# Patient Record
Sex: Male | Born: 1965 | Race: White | Hispanic: No | Marital: Married | Smoking: Former smoker
Health system: Southern US, Community
[De-identification: ages and names within clinical notes are randomized; demographics above are authoritative.]

## PROBLEM LIST (undated history)

## (undated) DIAGNOSIS — I10 Essential (primary) hypertension: Secondary | ICD-10-CM

## (undated) DIAGNOSIS — T7840XA Allergy, unspecified, initial encounter: Secondary | ICD-10-CM

## (undated) DIAGNOSIS — R519 Headache, unspecified: Secondary | ICD-10-CM

## (undated) DIAGNOSIS — E039 Hypothyroidism, unspecified: Secondary | ICD-10-CM

## (undated) DIAGNOSIS — J302 Other seasonal allergic rhinitis: Secondary | ICD-10-CM

## (undated) DIAGNOSIS — E785 Hyperlipidemia, unspecified: Secondary | ICD-10-CM

## (undated) HISTORY — DX: Allergy, unspecified, initial encounter: T78.40XA

## (undated) HISTORY — PX: WISDOM TOOTH EXTRACTION: SHX21

## (undated) HISTORY — PX: BACK SURGERY: SHX140

---

## 2000-11-10 ENCOUNTER — Observation Stay (HOSPITAL_COMMUNITY): Admission: EM | Admit: 2000-11-10 | Discharge: 2000-11-11 | Payer: Self-pay | Admitting: Emergency Medicine

## 2004-11-01 ENCOUNTER — Encounter: Admission: RE | Admit: 2004-11-01 | Discharge: 2004-11-01 | Payer: Self-pay | Admitting: Emergency Medicine

## 2004-11-01 IMAGING — CR DG CHEST 2V
2 series · 2 of 2 positions shown · non-contrast
Comparison: None available.

CLINICAL DATA: Positive PPD, smoker. 
 CHEST - 2 VIEW:

[w chest pa]
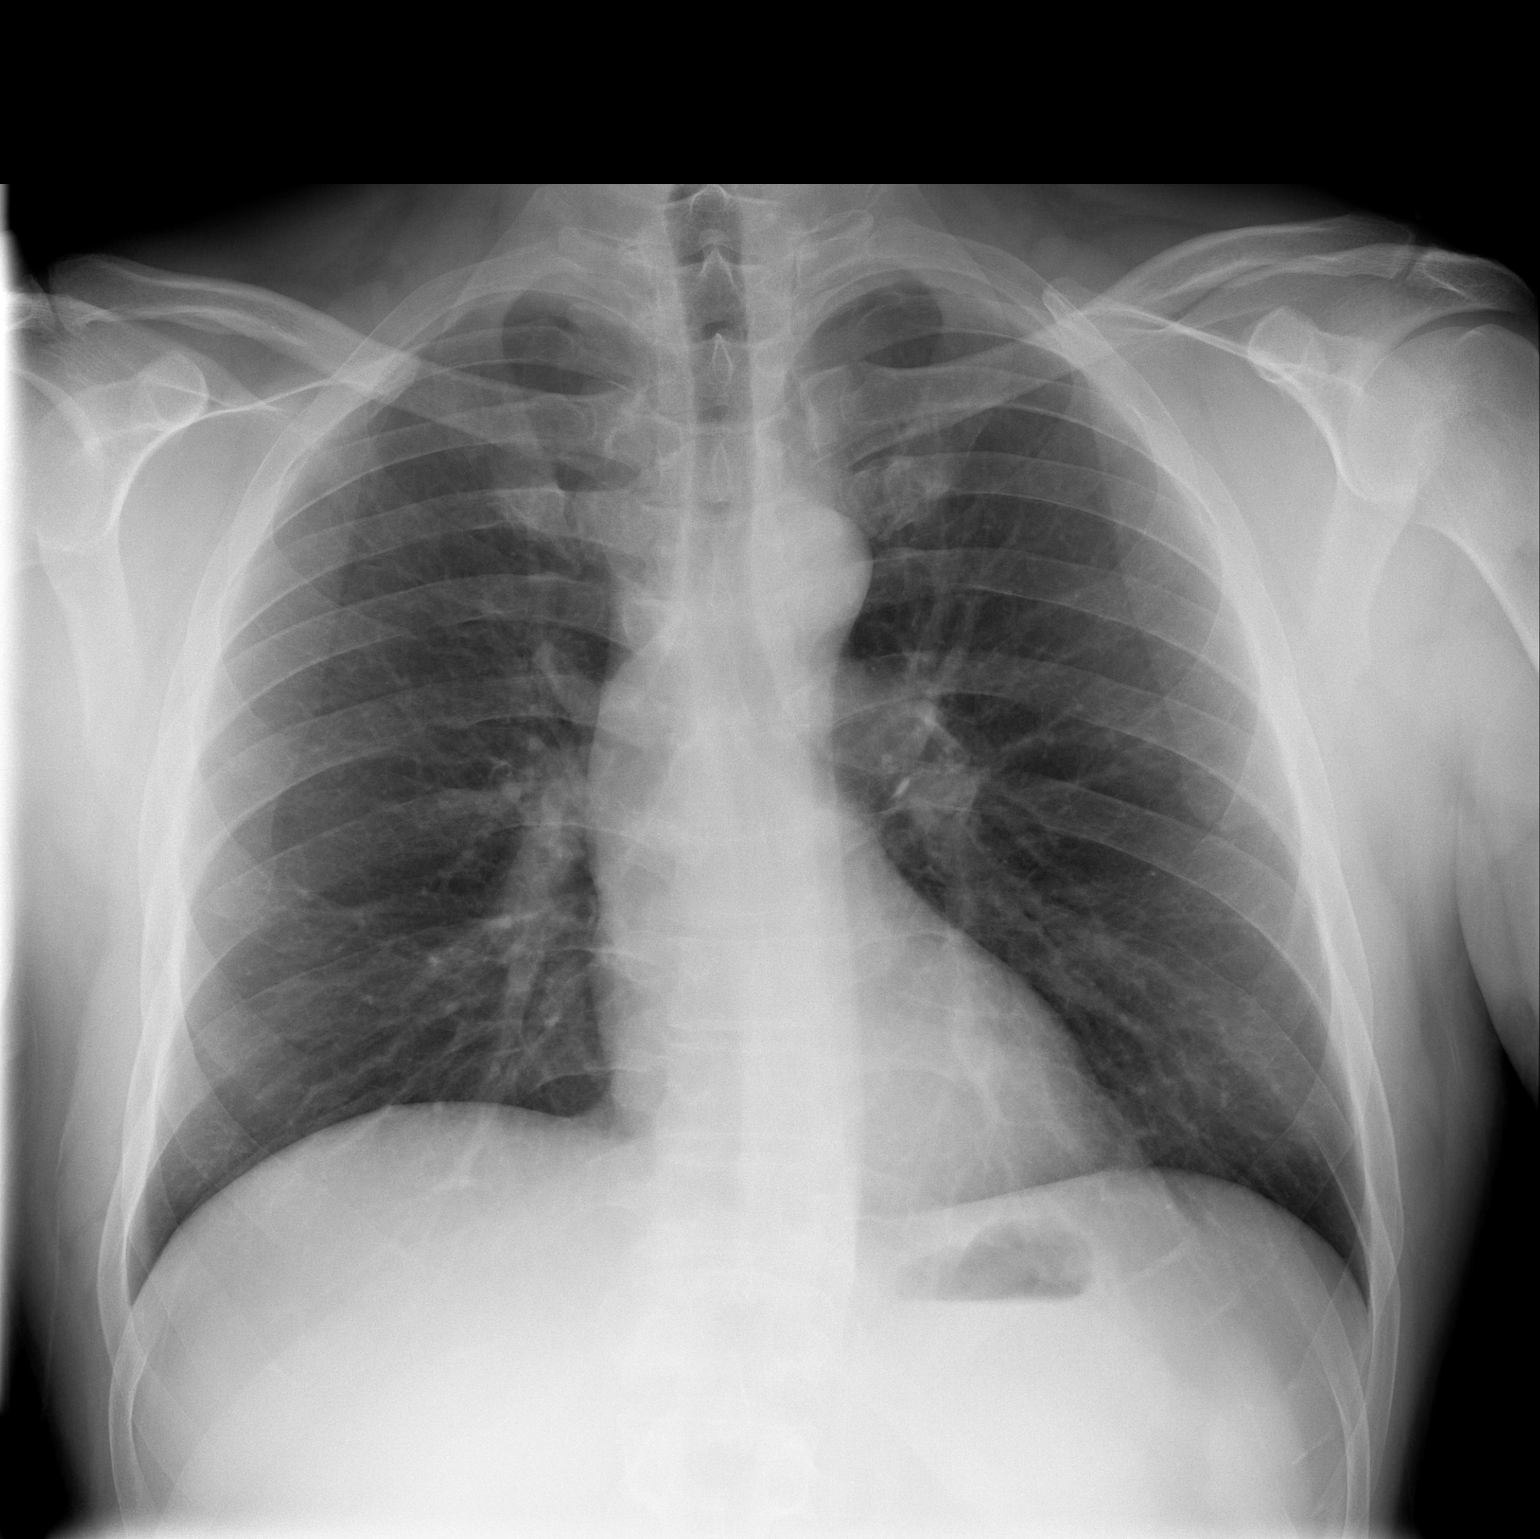

[w chest lat]
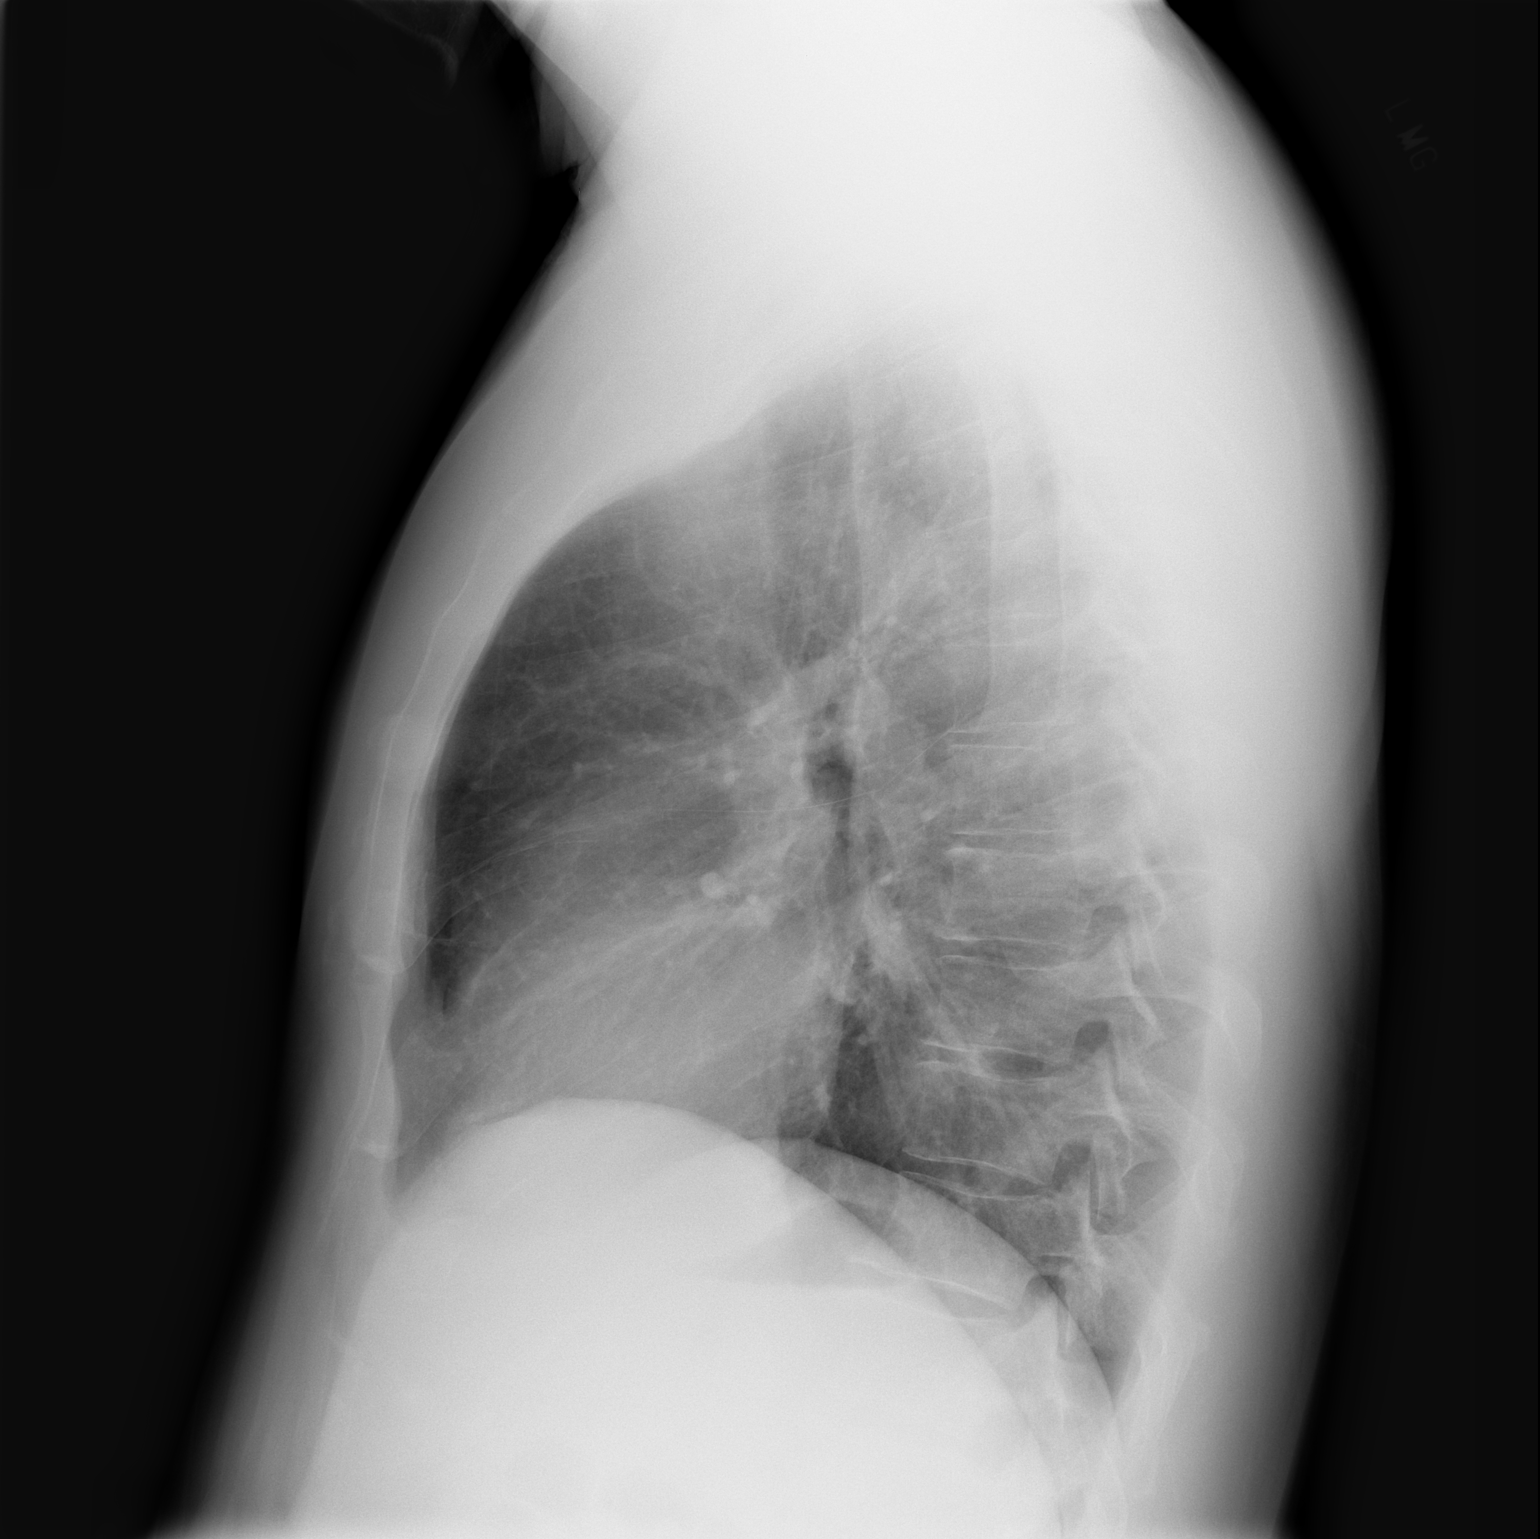

[2 of 2 positions shown; findings below may reference images not displayed]

FINDINGS: The trachea is midline.  Heart size is within normal limits.  There is mild prominence of ascending aorta.  Mild biapical pleural thickening. The lungs are clear.  No pleural fluid.
IMPRESSION: No evidence of active tuberculosis.

## 2005-09-24 ENCOUNTER — Encounter: Admission: RE | Admit: 2005-09-24 | Discharge: 2005-09-24 | Payer: Self-pay | Admitting: Family Medicine

## 2006-01-25 ENCOUNTER — Encounter: Admission: RE | Admit: 2006-01-25 | Discharge: 2006-01-25 | Payer: Self-pay | Admitting: Emergency Medicine

## 2006-01-25 IMAGING — CR DG CHEST 2V
2 series · 2 of 2 positions shown · non-contrast
Comparison: [DATE].

CLINICAL DATA: Positive PPD.  Cough.
 CHEST - TWO VIEWS:

[view not recorded (1 of 2)]
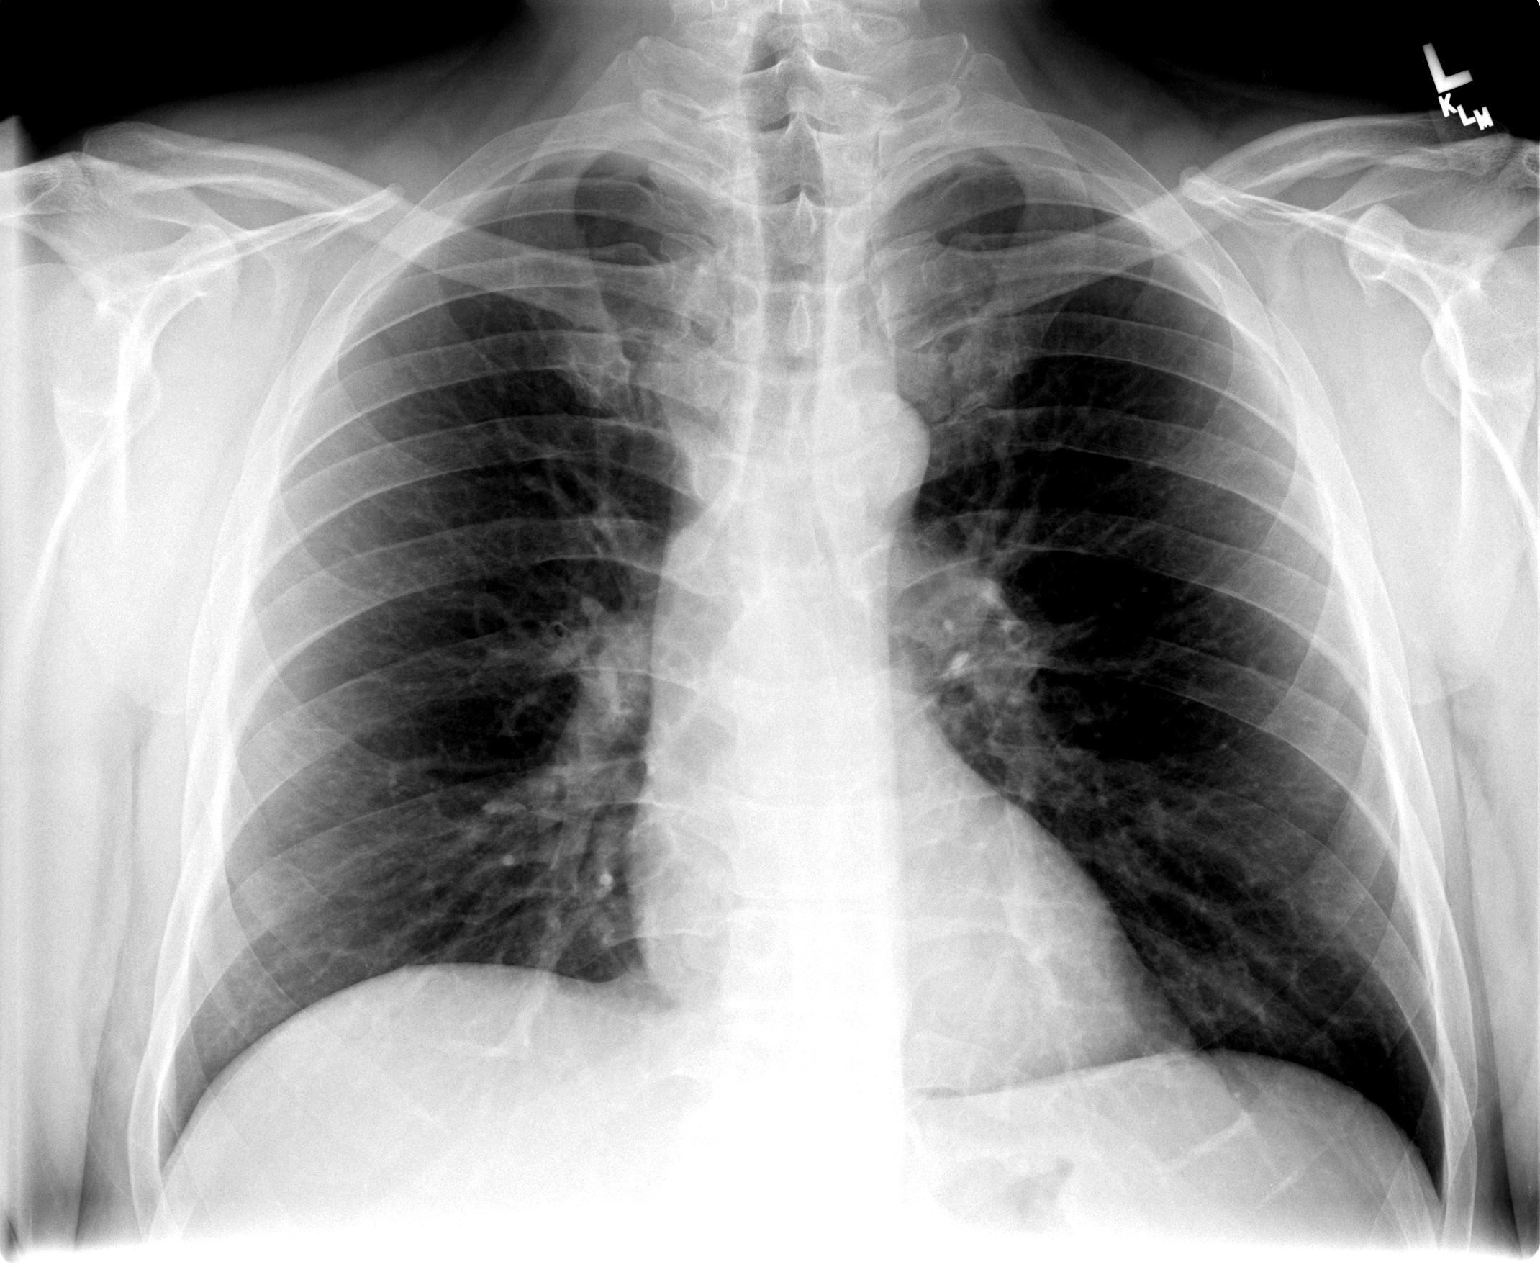

[view not recorded (2 of 2)]
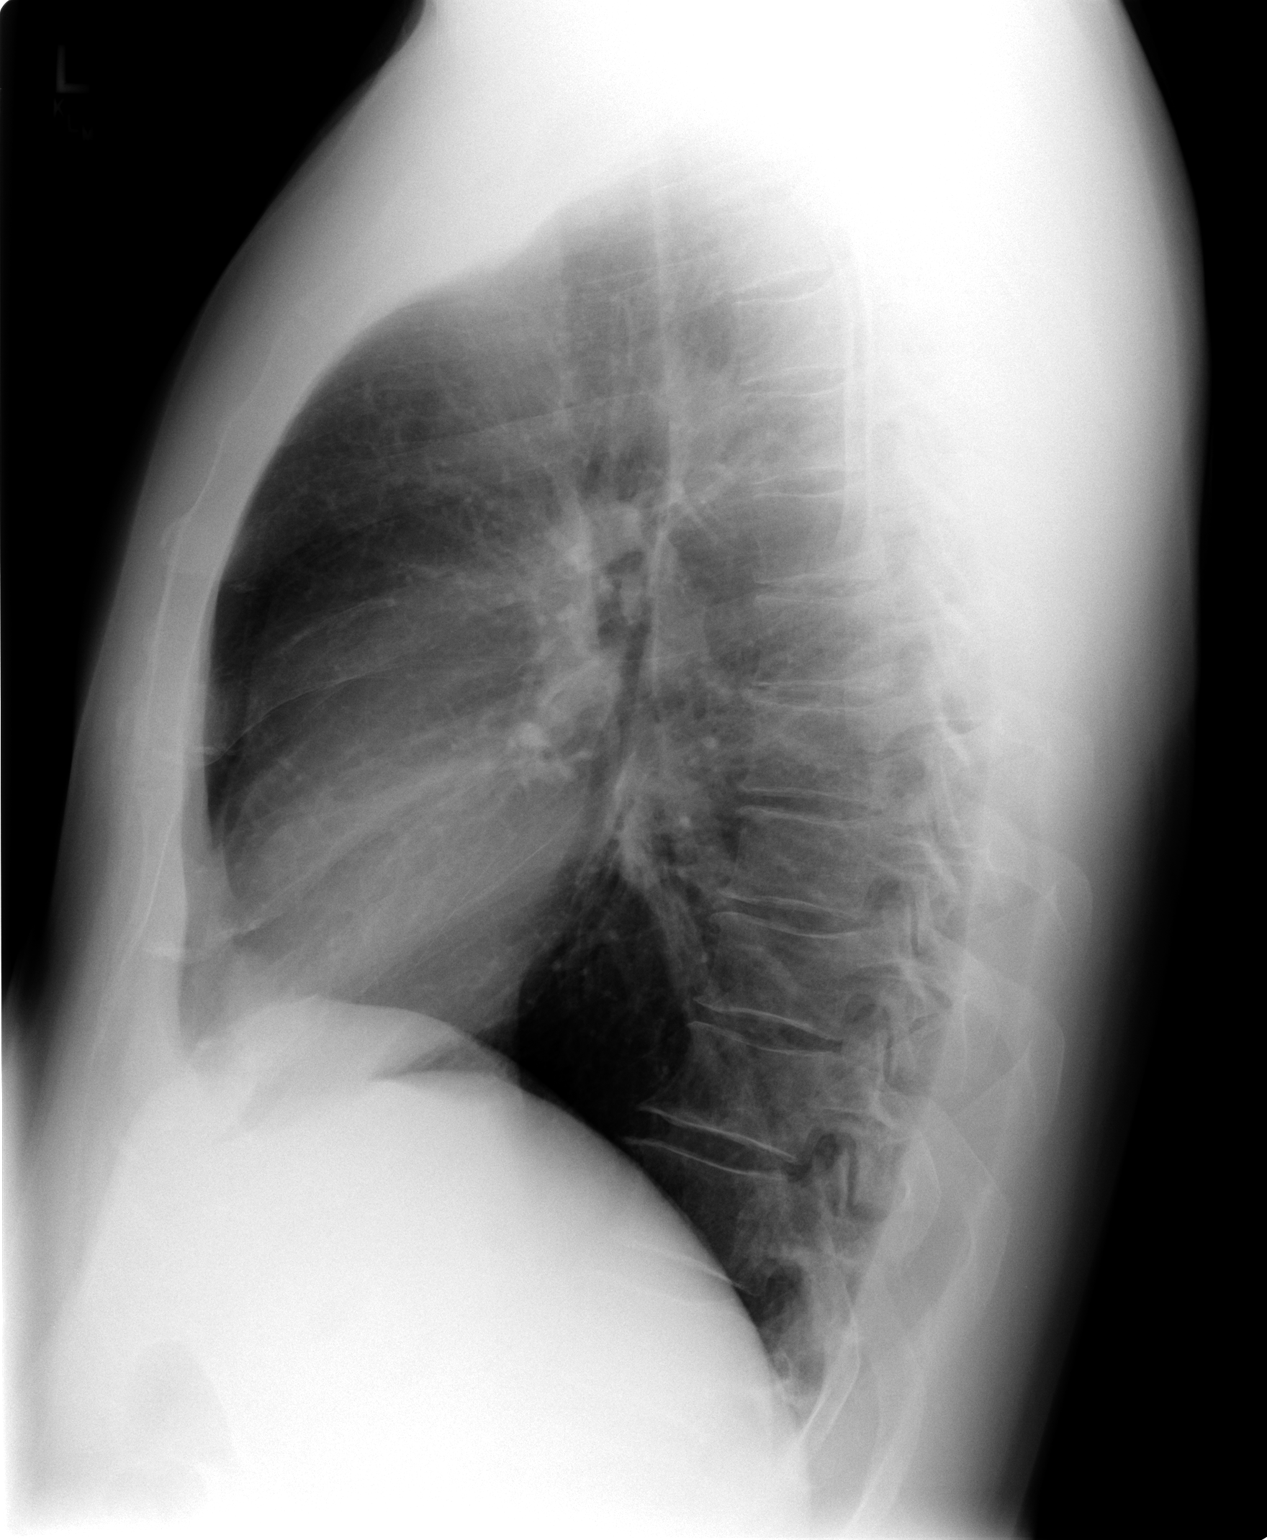

[2 of 2 positions shown; findings below may reference images not displayed]

The heart size and mediastinal contours are within normal limits.  Both lungs are clear.  The visualized skeletal structures are unremarkable.
IMPRESSION: No active cardiopulmonary disease.

## 2006-04-26 IMAGING — CR DG CHEST 2V
2 series · 2 of 2 positions shown · non-contrast
Comparison: [DATE].

CLINICAL DATA: Positive PPD one year ago, asymptomatic. 
 PA AND LATERAL CHEST:

[view not recorded (1 of 2)]
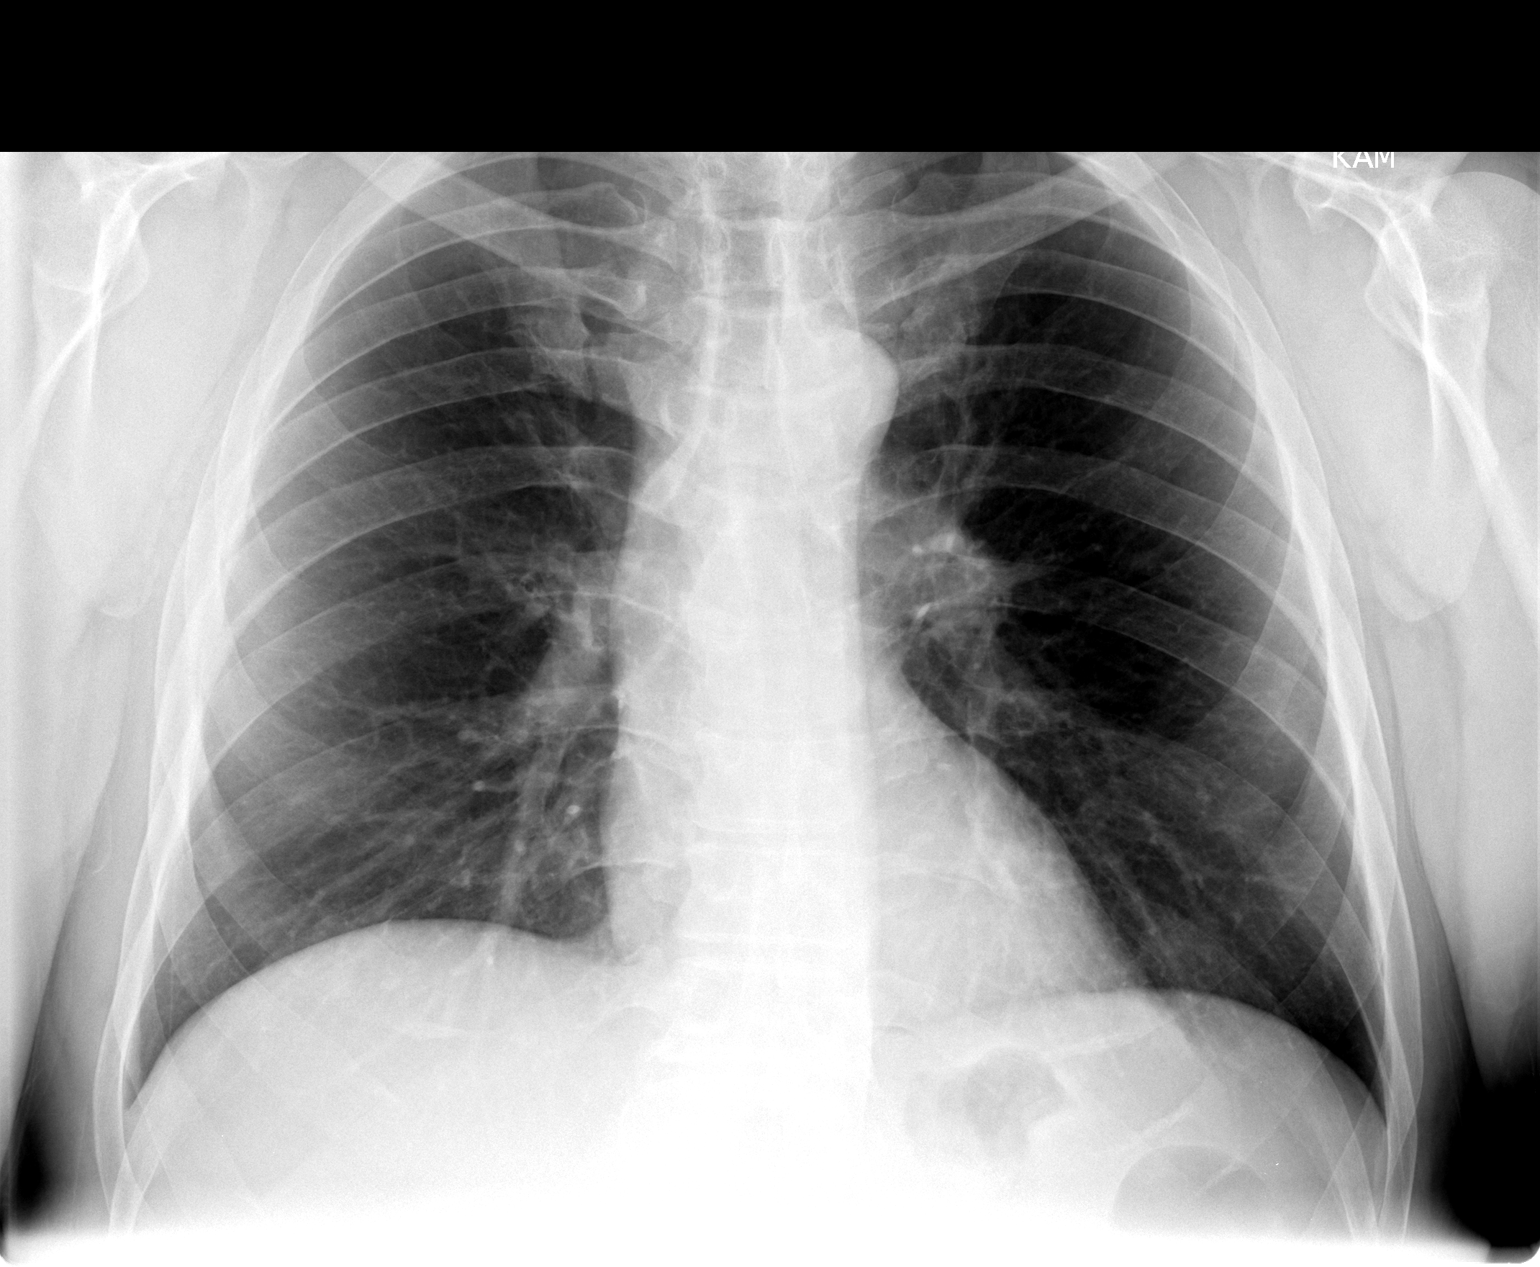

[view not recorded (2 of 2)]
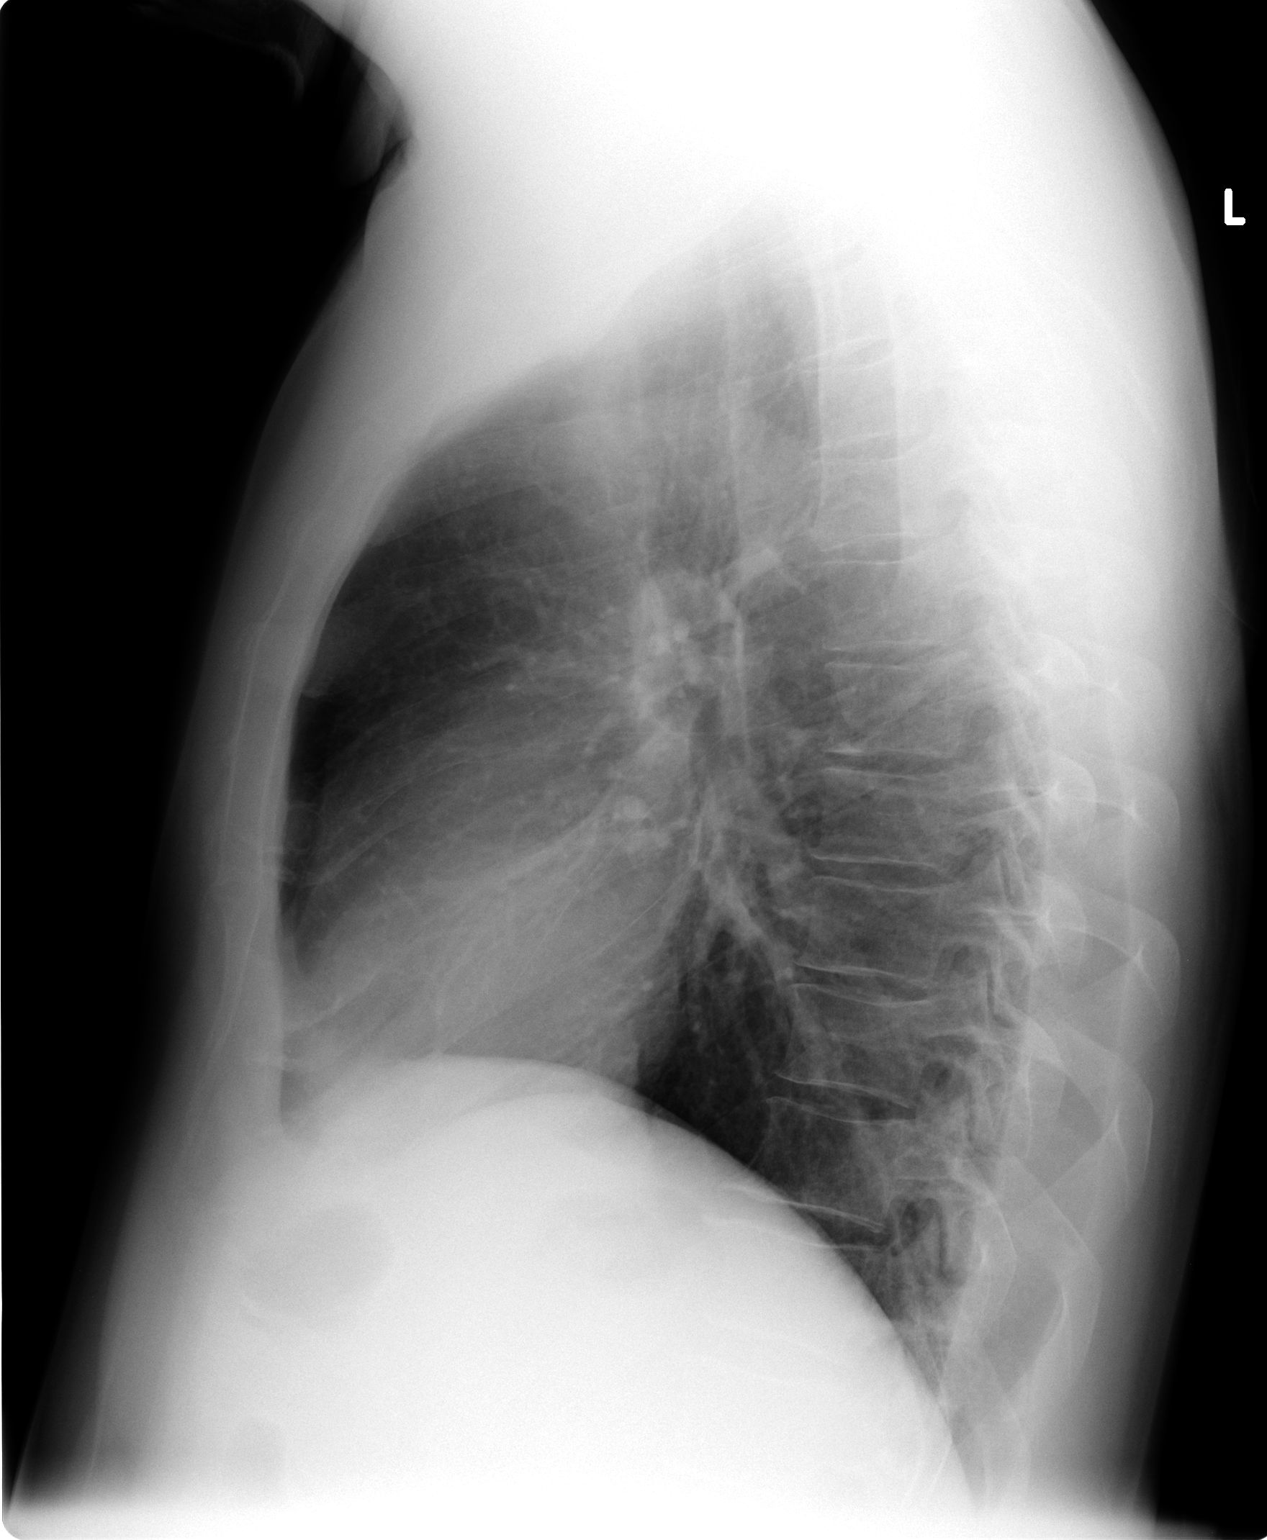

[2 of 2 positions shown; findings below may reference images not displayed]

The heart size and mediastinal contours are unremarkable.  The lungs are clear.  The visualized skeleton is unremarkable.
IMPRESSION: No active disease.

## 2008-03-26 ENCOUNTER — Encounter: Admission: RE | Admit: 2008-03-26 | Discharge: 2008-03-26 | Payer: Self-pay | Admitting: Emergency Medicine

## 2008-03-26 IMAGING — CT CT HEAD W/O CM
2 series · 16 of 30 positions shown, 20 images · non-contrast
Comparison: None

CLINICAL DATA: Worsening headache.  Blurred vision.

CT HEAD WITHOUT CONTRAST
TECHNIQUE: Contiguous axial images were obtained from the base of
the skull through the vertex without contrast.

[Series 2: brain · axial · 0.44mm/px · z∈[-409,-284]mm · 13 of 30 slices shown, 17 images]
[im 3/30  brain]
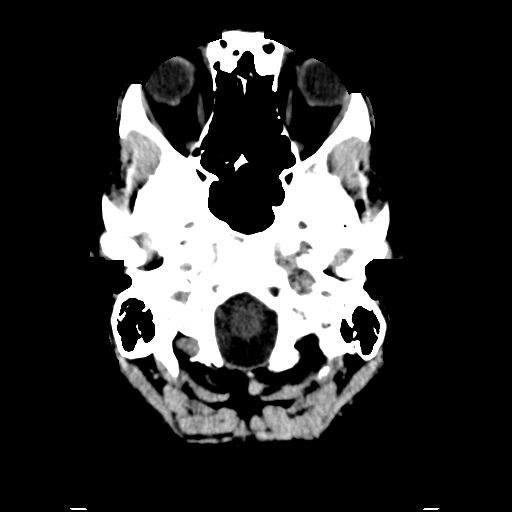
[im 3/30  bone]
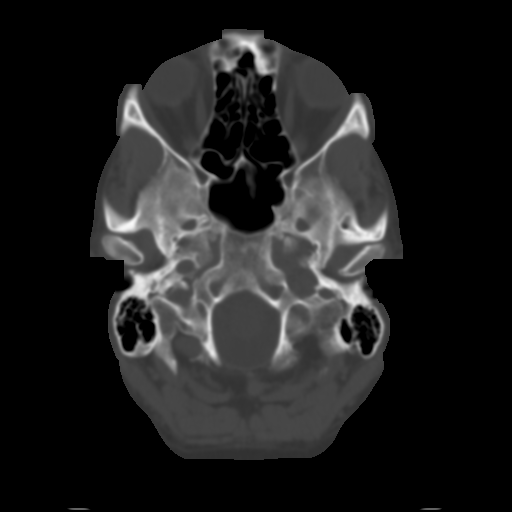
[im 5/30  brain]
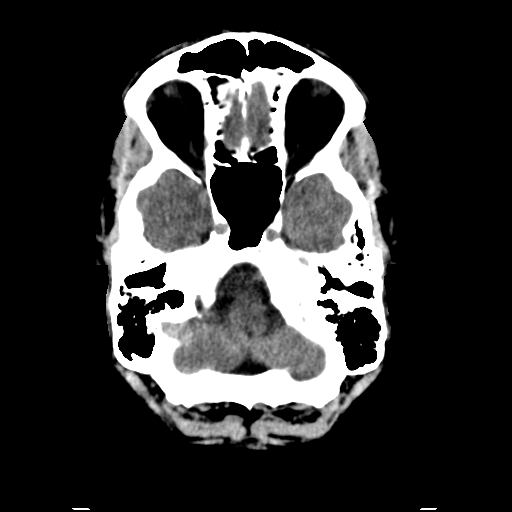
[im 7/30  brain]
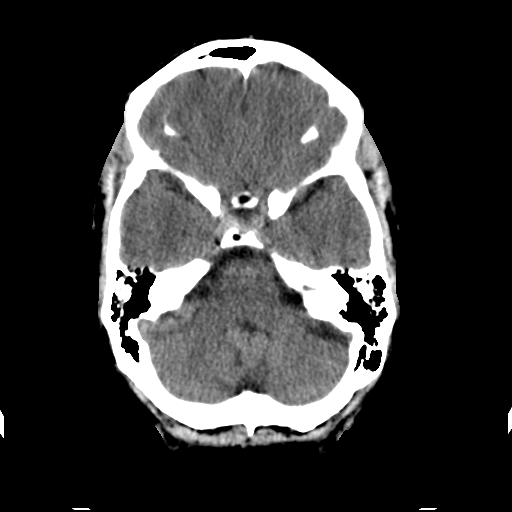
[im 9/30  brain]
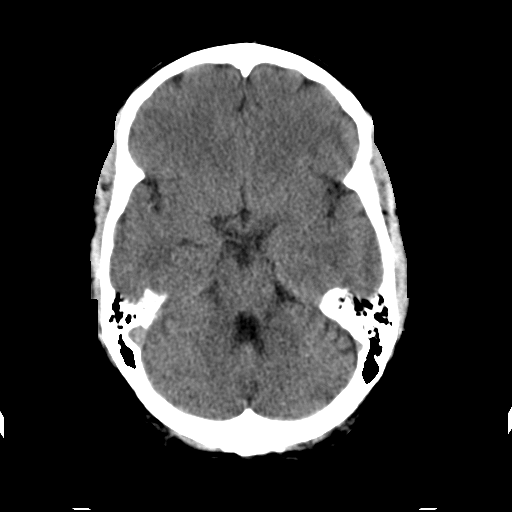
[im 11/30  brain]
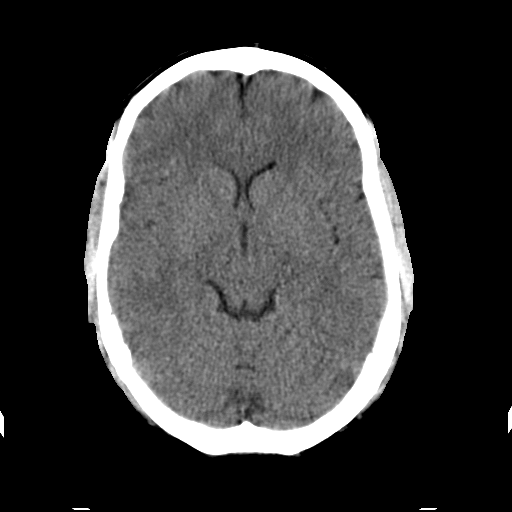
[im 11/30  bone]
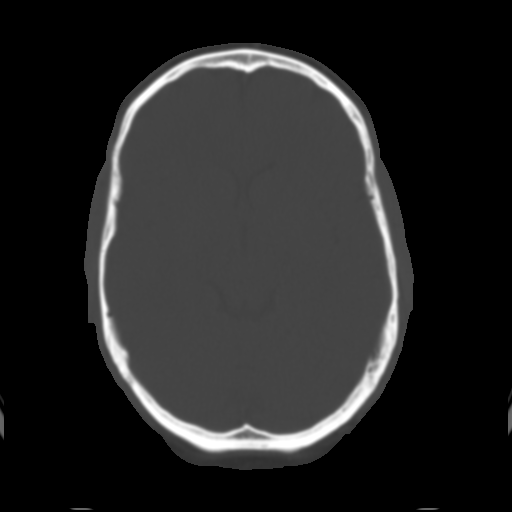
[im 13/30  brain]
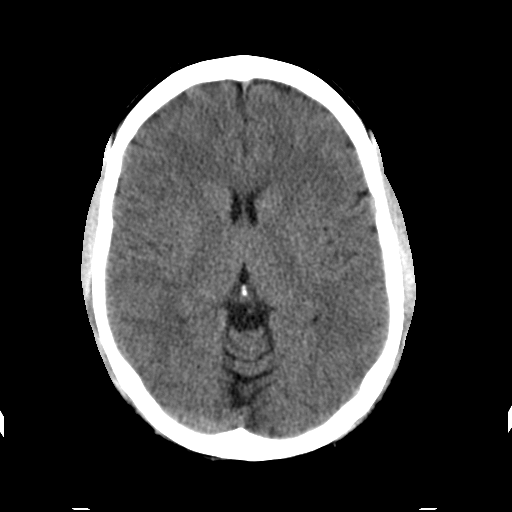
[im 15/30  brain]
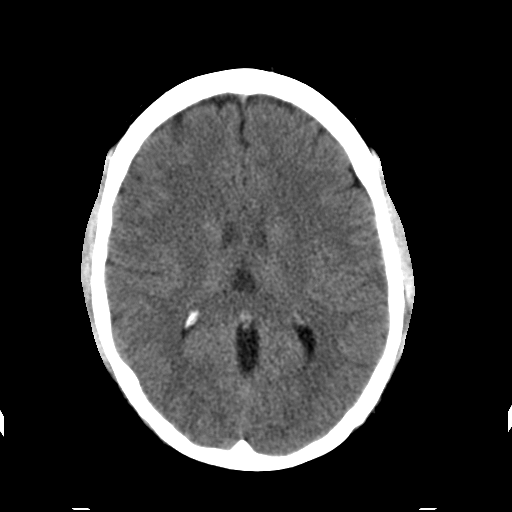
[im 17/30  brain]
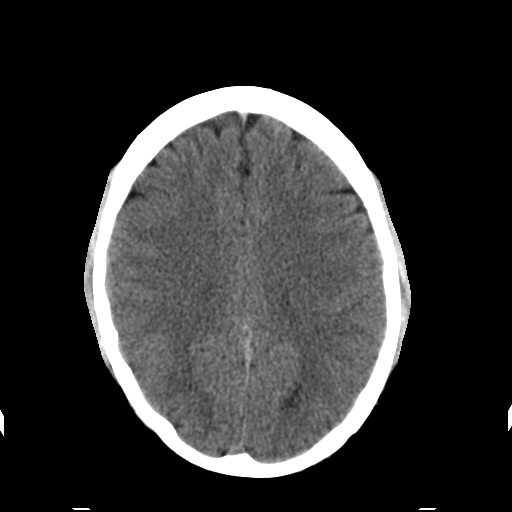
[im 19/30  brain]
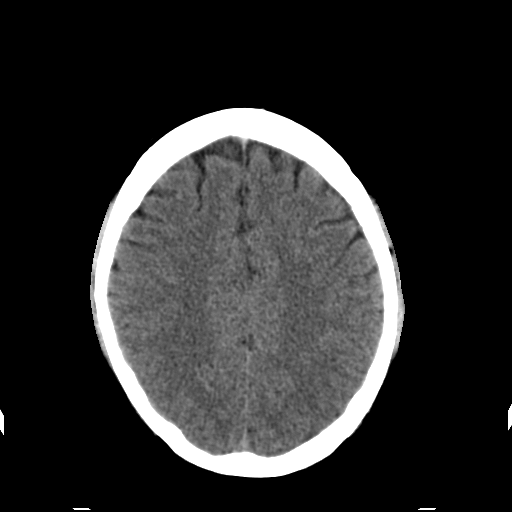
[im 19/30  bone]
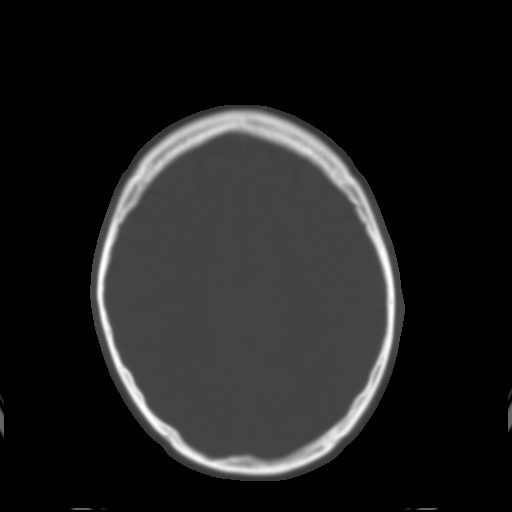
[im 21/30  brain]
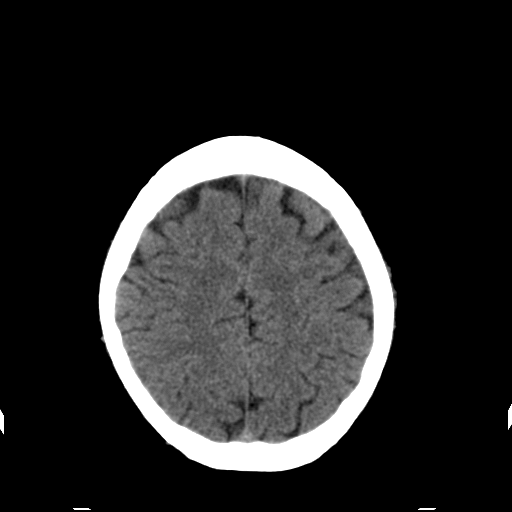
[im 23/30  brain]
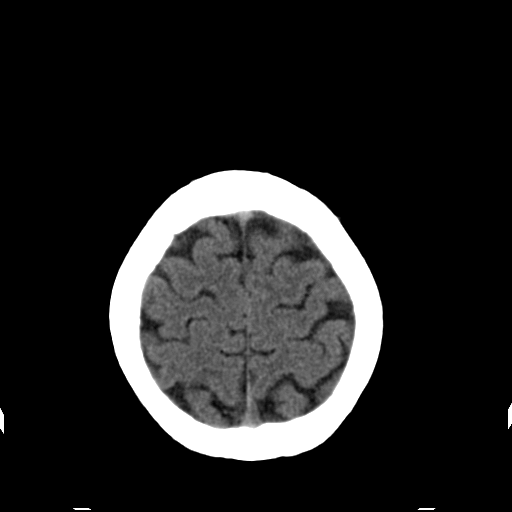
[im 25/30  brain]
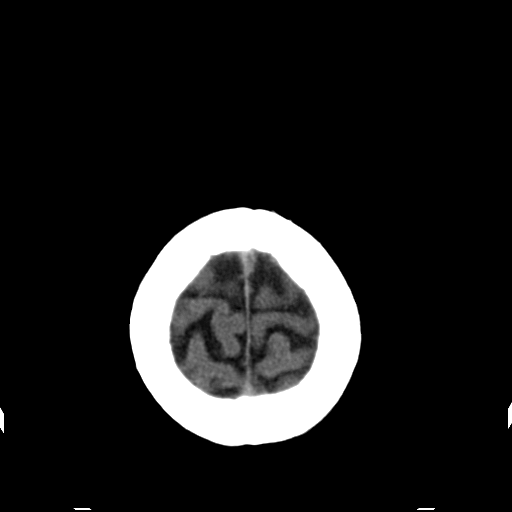
[im 27/30  brain]
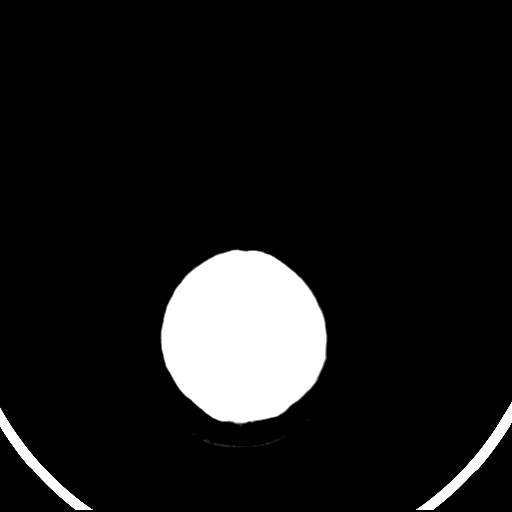
[im 27/30  bone]
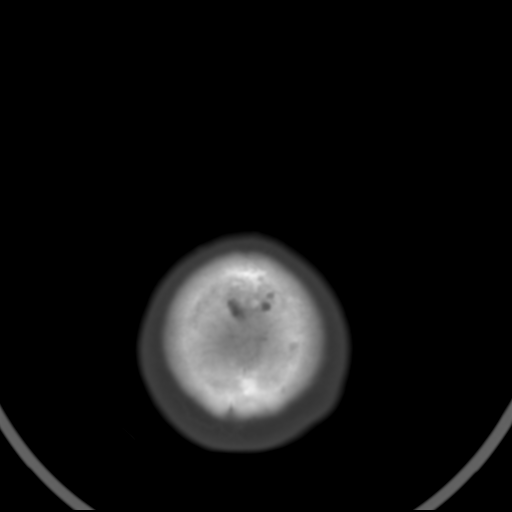

[Series 3: bone windows · axial · 0.44mm/px · z∈[-409,-367]mm · 3 of 30 slices shown]
[im 3/30  bone]
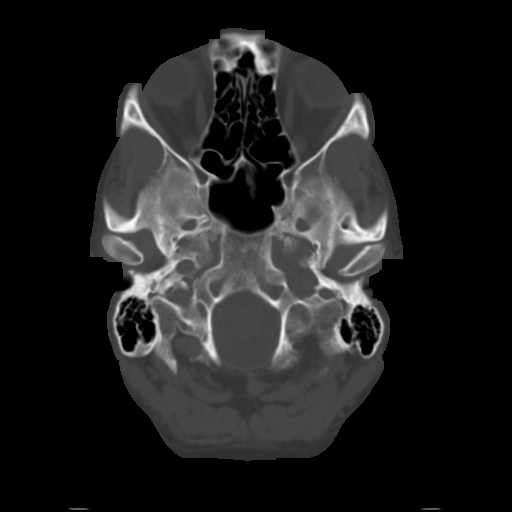
[im 7/30  bone]
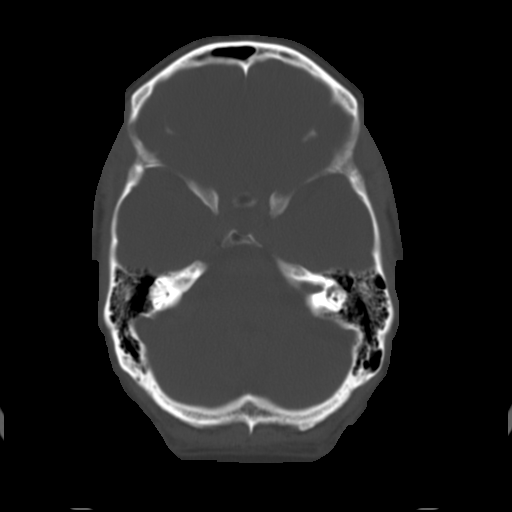
[im 11/30  bone]
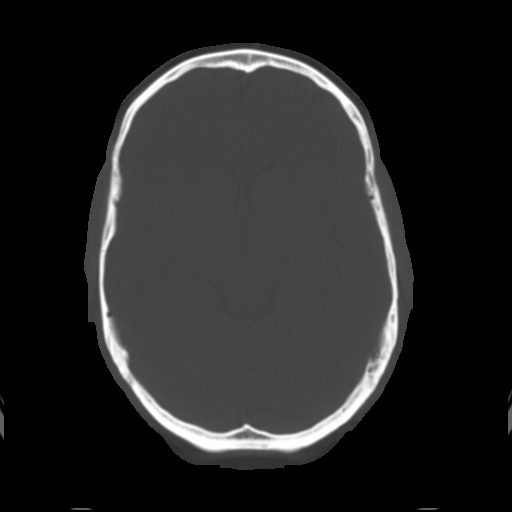

[16 of 30 positions shown; findings below may reference images not displayed]

FINDINGS: The brain has a normal appearance without evidence of
atrophy, infarction, mass lesion, hemorrhage, hydrocephalus or
extra-axial collection.  Visualized paranasal sinuses are clear.
Middle ears and mastoids are clear.  No calvarial lesion.
IMPRESSION: Normal examination

## 2009-06-15 ENCOUNTER — Ambulatory Visit (HOSPITAL_COMMUNITY)
Admission: RE | Admit: 2009-06-15 | Discharge: 2009-06-15 | Payer: Self-pay | Admitting: Physical Medicine and Rehabilitation

## 2009-06-15 IMAGING — CR DG ORBITS FOR FOREIGN BODY
2 series · 2 of 2 positions shown · non-contrast
Comparison: [DATE]

CLINICAL DATA: pre MRI screening

ORBITS FOR FOREIGN BODY - 2 VIEW

[w waters *]
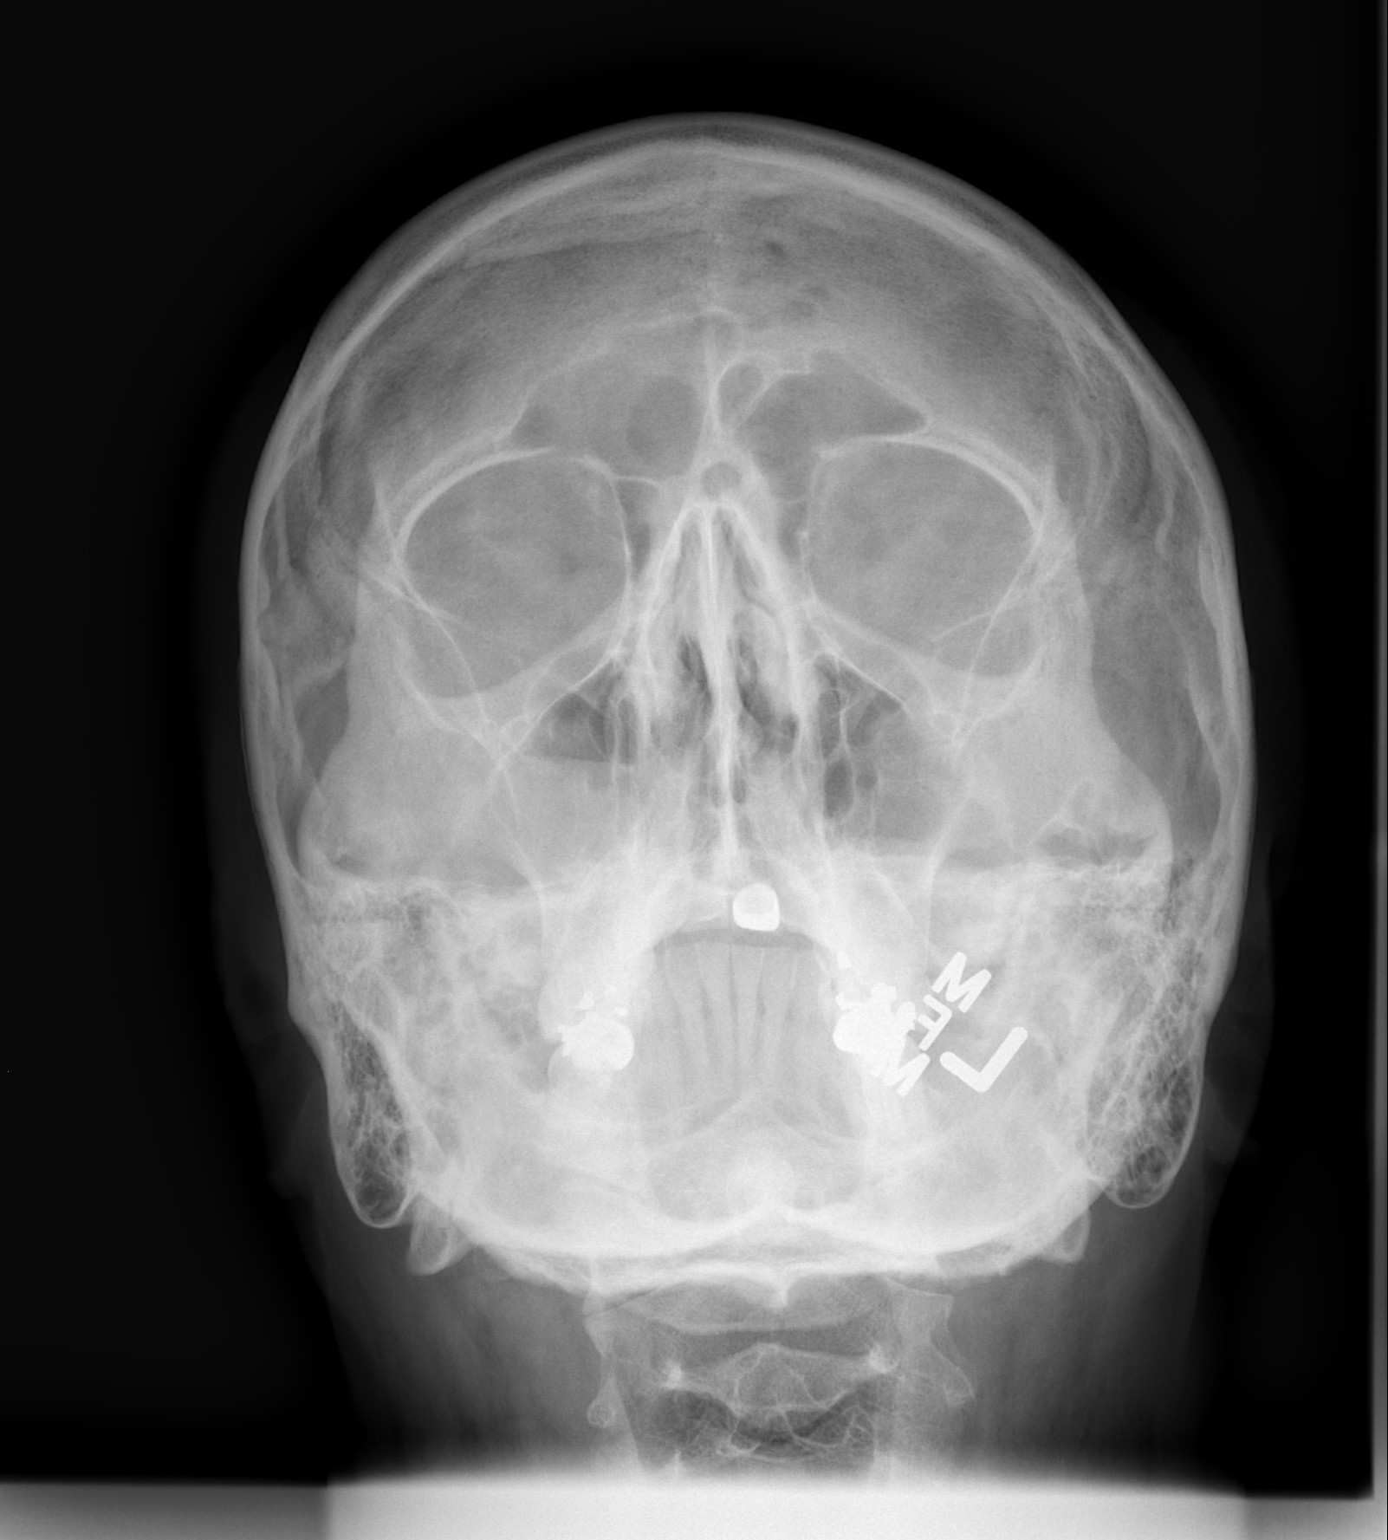

[w waters]
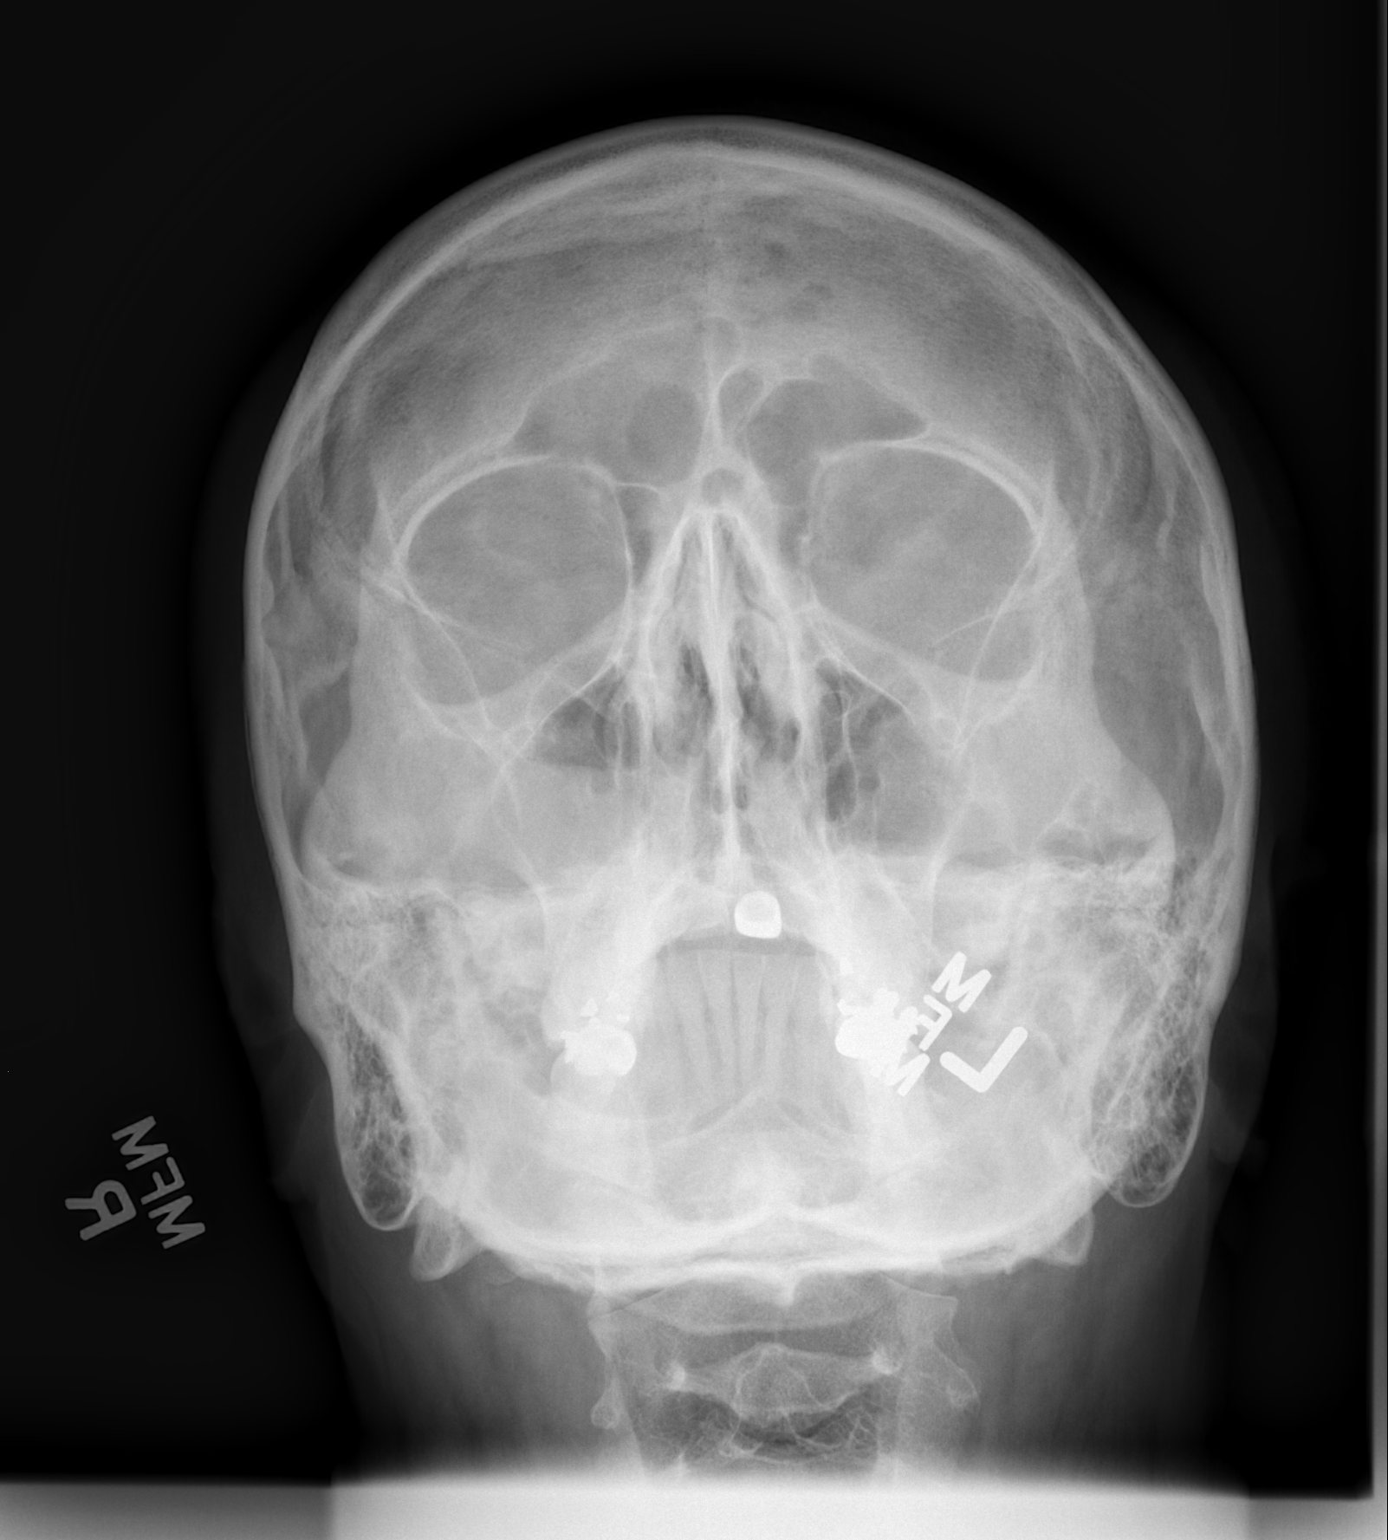

[2 of 2 positions shown; findings below may reference images not displayed]

FINDINGS: There are no metallic foreign bodies identified within
the orbits to preclude MRI.
IMPRESSION: 1.  Negative for orbital metallic foreign bodies.

## 2009-06-28 IMAGING — CR DG CHEST 2V
2 series · 2 of 2 positions shown · non-contrast
Comparison: [DATE] study

CLINICAL DATA: History of tobacco smoking.  Preoperative
cardiopulmonary evaluation.

CHEST - 2 VIEW

[w chest pa]
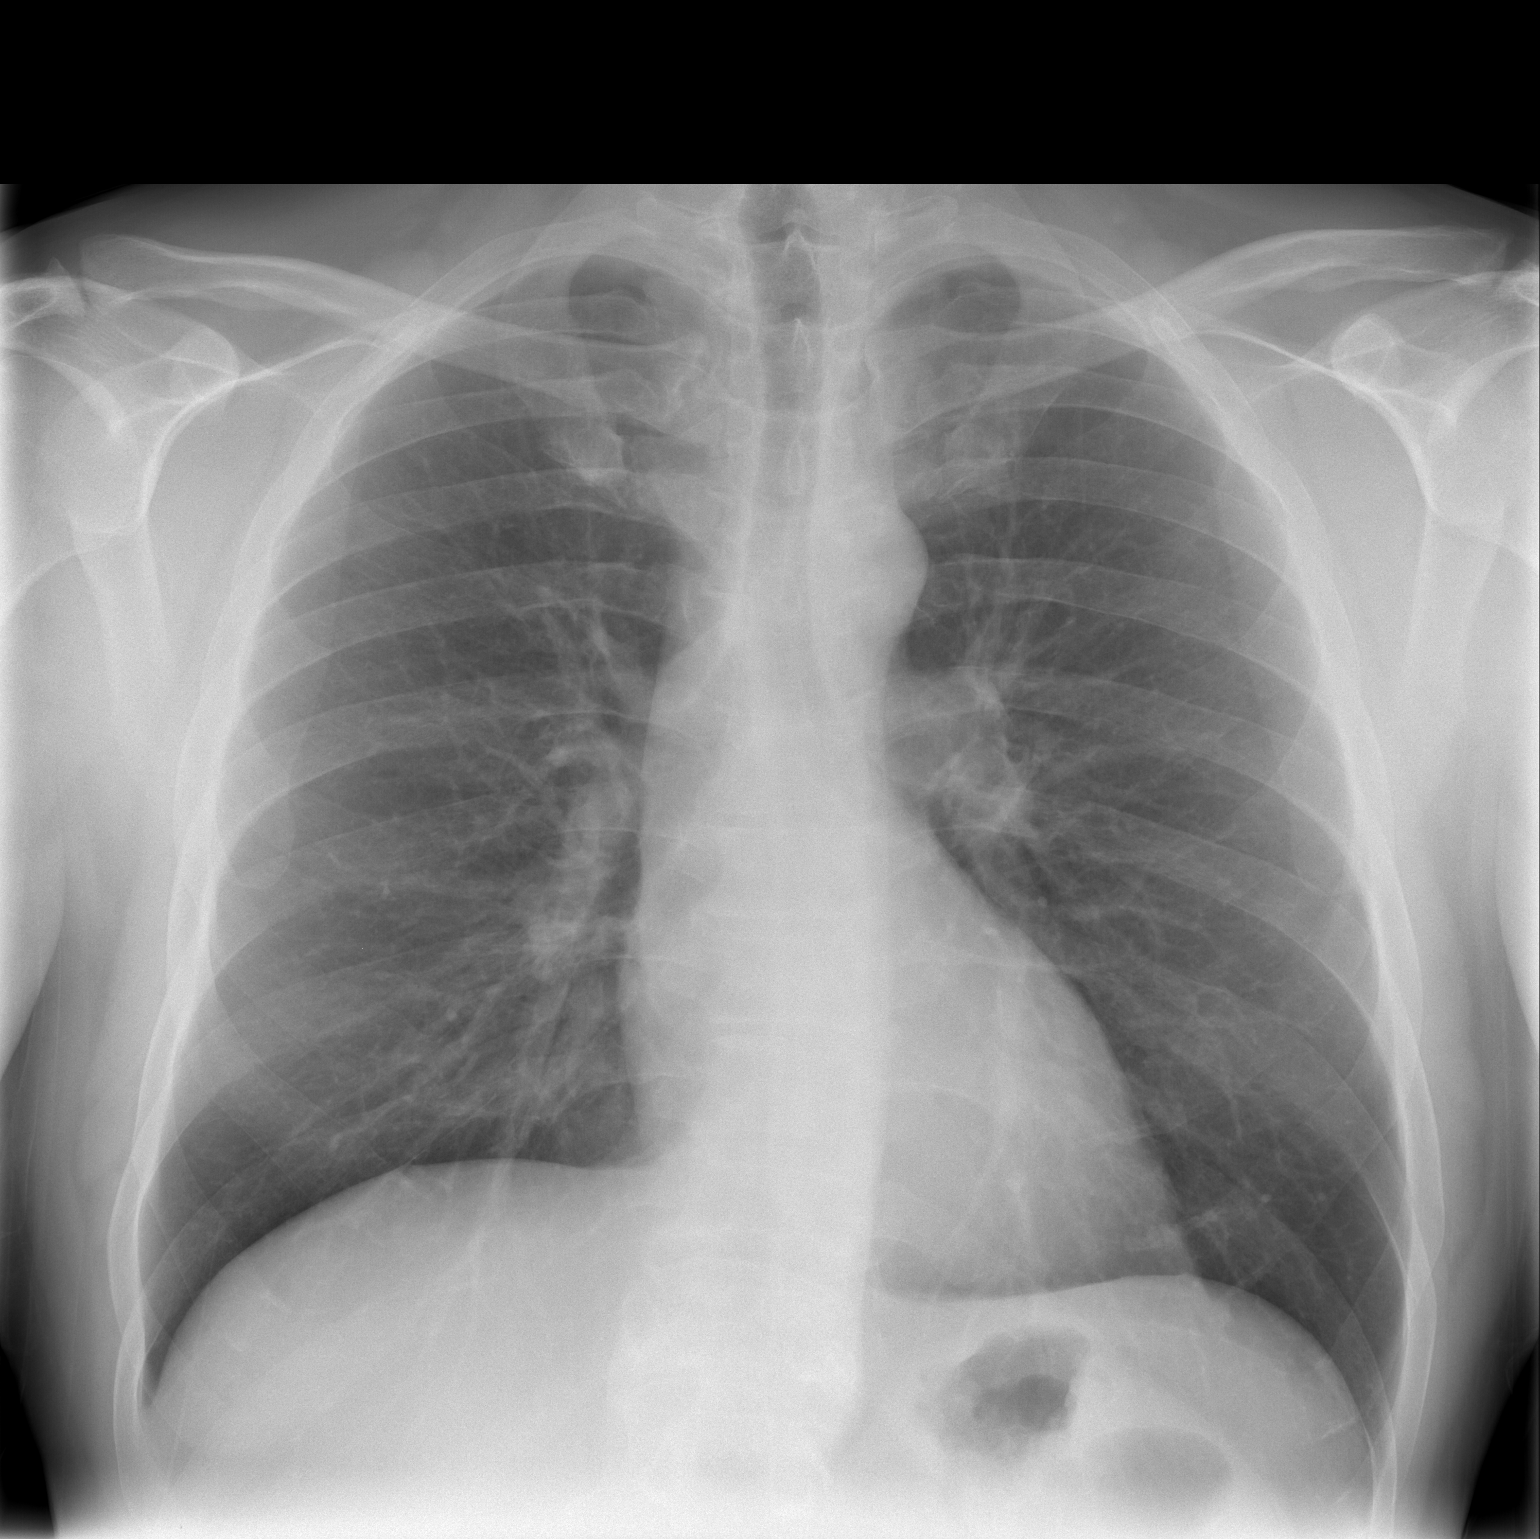

[w chest lat]
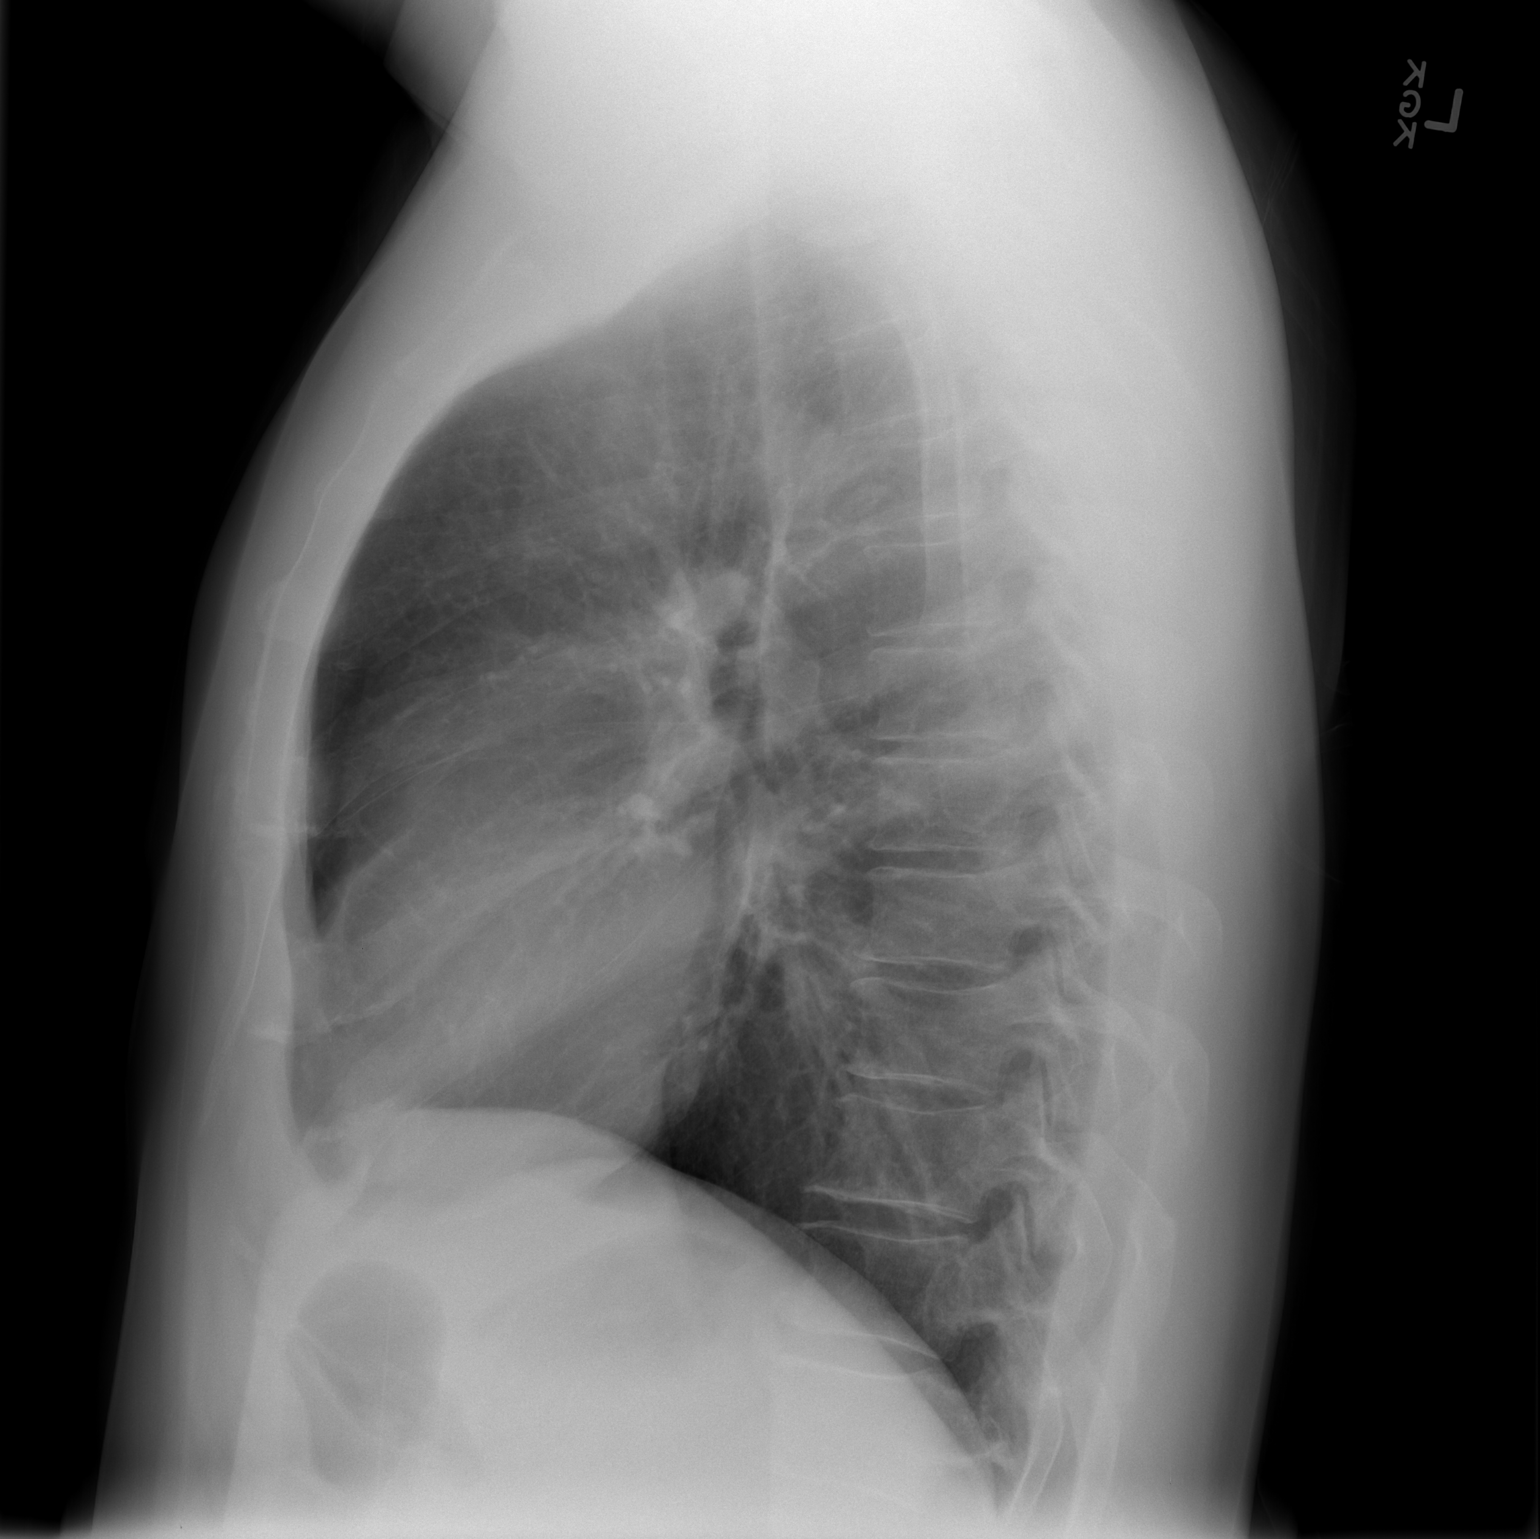

[2 of 2 positions shown; findings below may reference images not displayed]

FINDINGS: The cardiac silhouette is normal size and shape. No
pleural abnormality is evident. The lungs are well aerated and free
of infiltrates. Bones appear average for age.
IMPRESSION: No acute or active process is seen.  No significant change from
previous study is seen.

## 2009-06-28 IMAGING — CR DG LUMBAR SPINE 2-3V
2 series · 2 of 2 positions shown · non-contrast
Comparison: Chest radiograph from the same day.

CLINICAL DATA: 43-year-old male preoperative study for ruptured
disc surgery.

LUMBAR SPINE - 2-3 VIEW

[t l-spine a.p.]
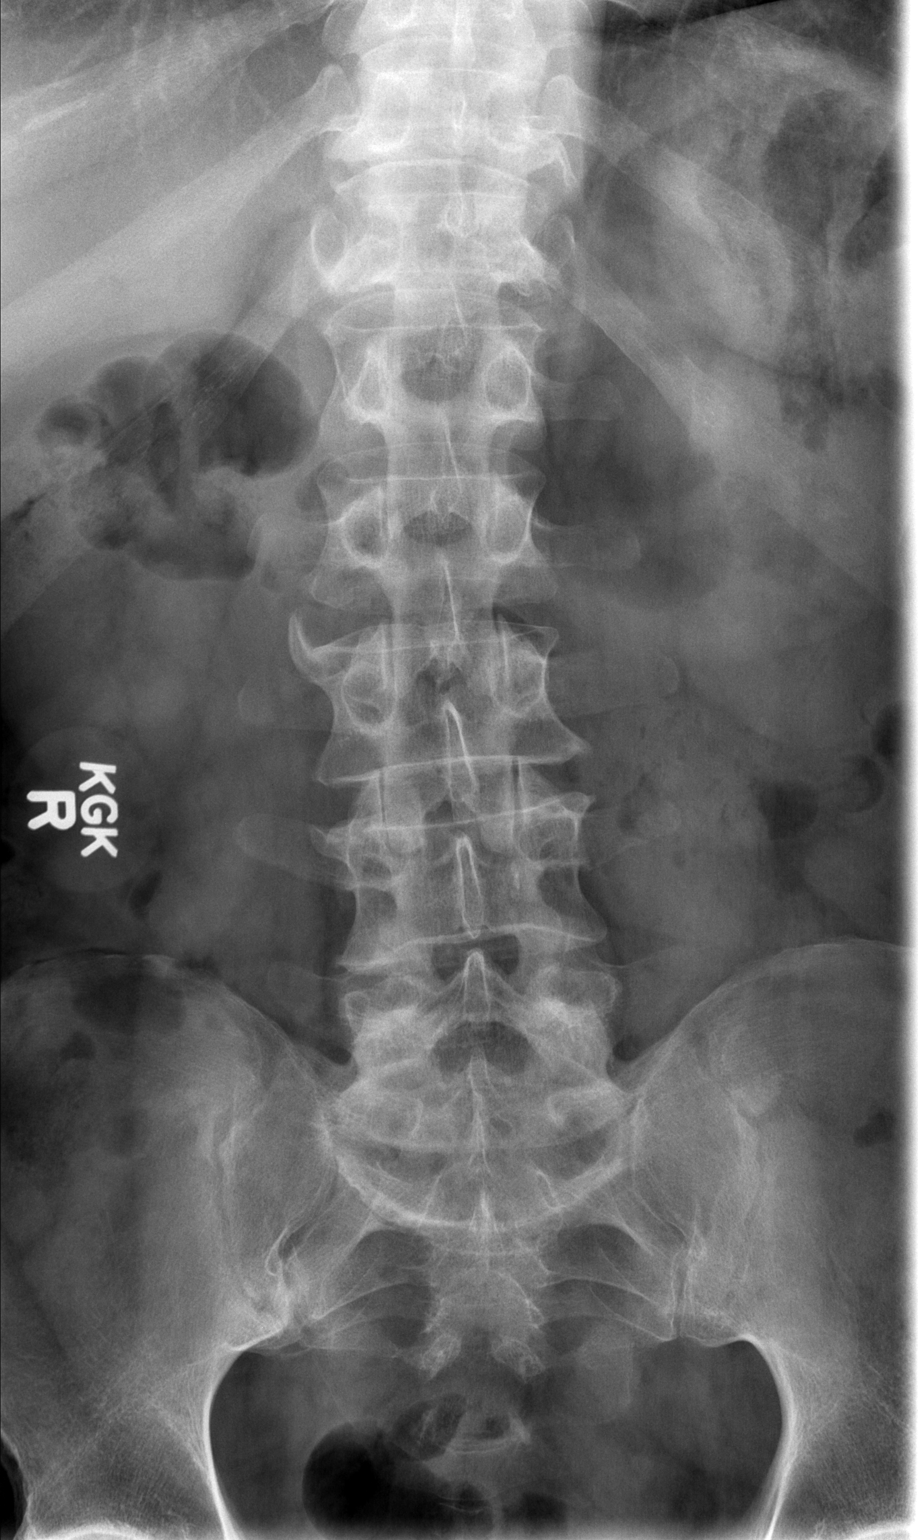

[t l-spine lat]
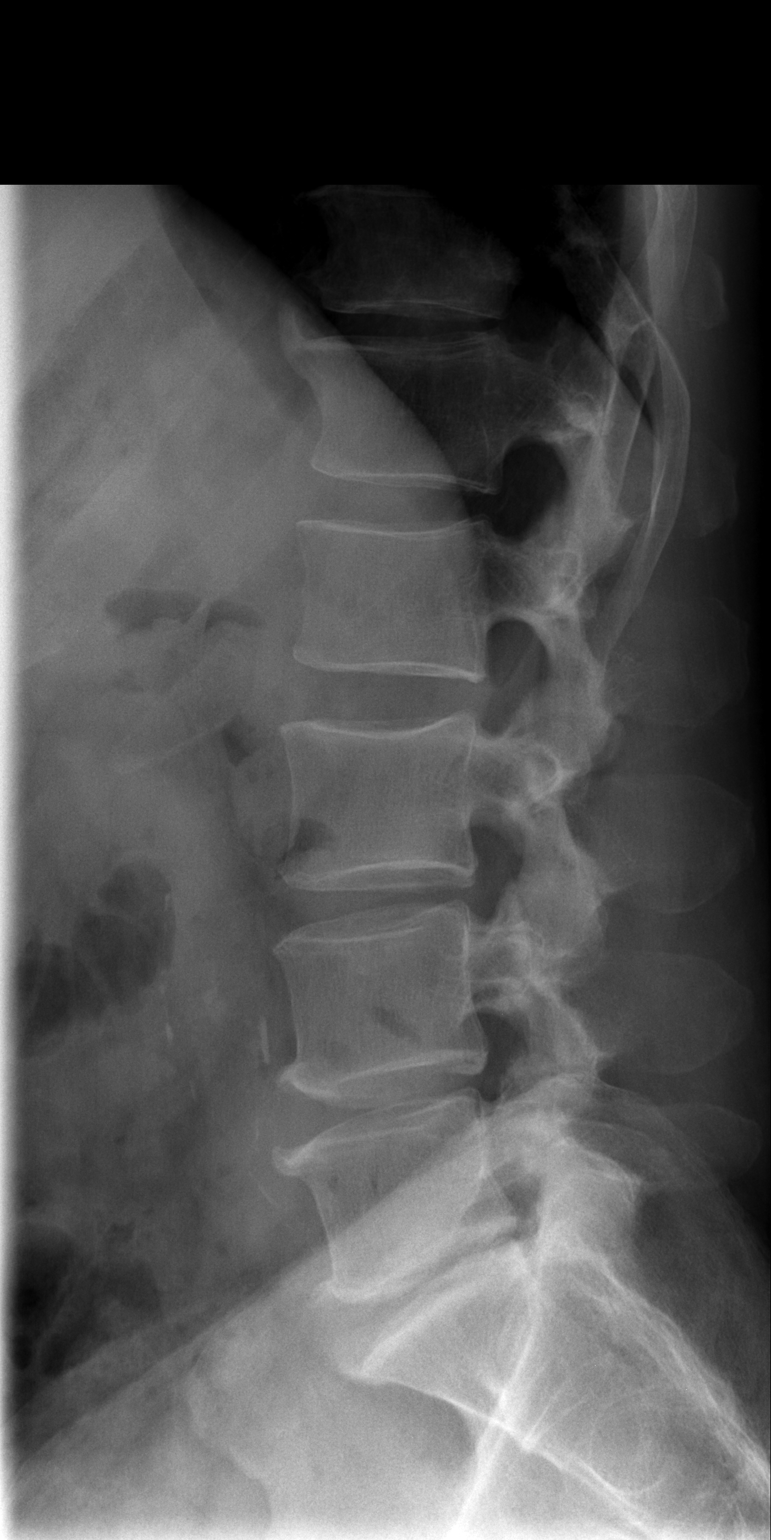

[2 of 2 positions shown; findings below may reference images not displayed]

FINDINGS: Normal lumbar segmentation.  Disc space narrowing L5-S1.
Trace retrolisthesis of L4 on L5.  Bone mineralization is within
normal limits.  Sacrum and SI joints appear within normal limits.
IMPRESSION: Chronic L5-S1 disc degeneration.  Trace retrolisthesis of L4 on L5.
The lumbar levels on these images were numbered in anticipation of
surgery.

## 2009-06-30 ENCOUNTER — Inpatient Hospital Stay (HOSPITAL_COMMUNITY): Admission: RE | Admit: 2009-06-30 | Discharge: 2009-07-02 | Payer: Self-pay | Admitting: Orthopedic Surgery

## 2009-06-30 IMAGING — CR DG OR PORTABLE SPINE
1 series · 1 of 1 positions shown · non-contrast
Comparison: [8N] hours the same day earlier.

CLINICAL DATA: 43-year-old male undergoing lumbar surgery.

PORTABLE SPINE

[view not recorded]
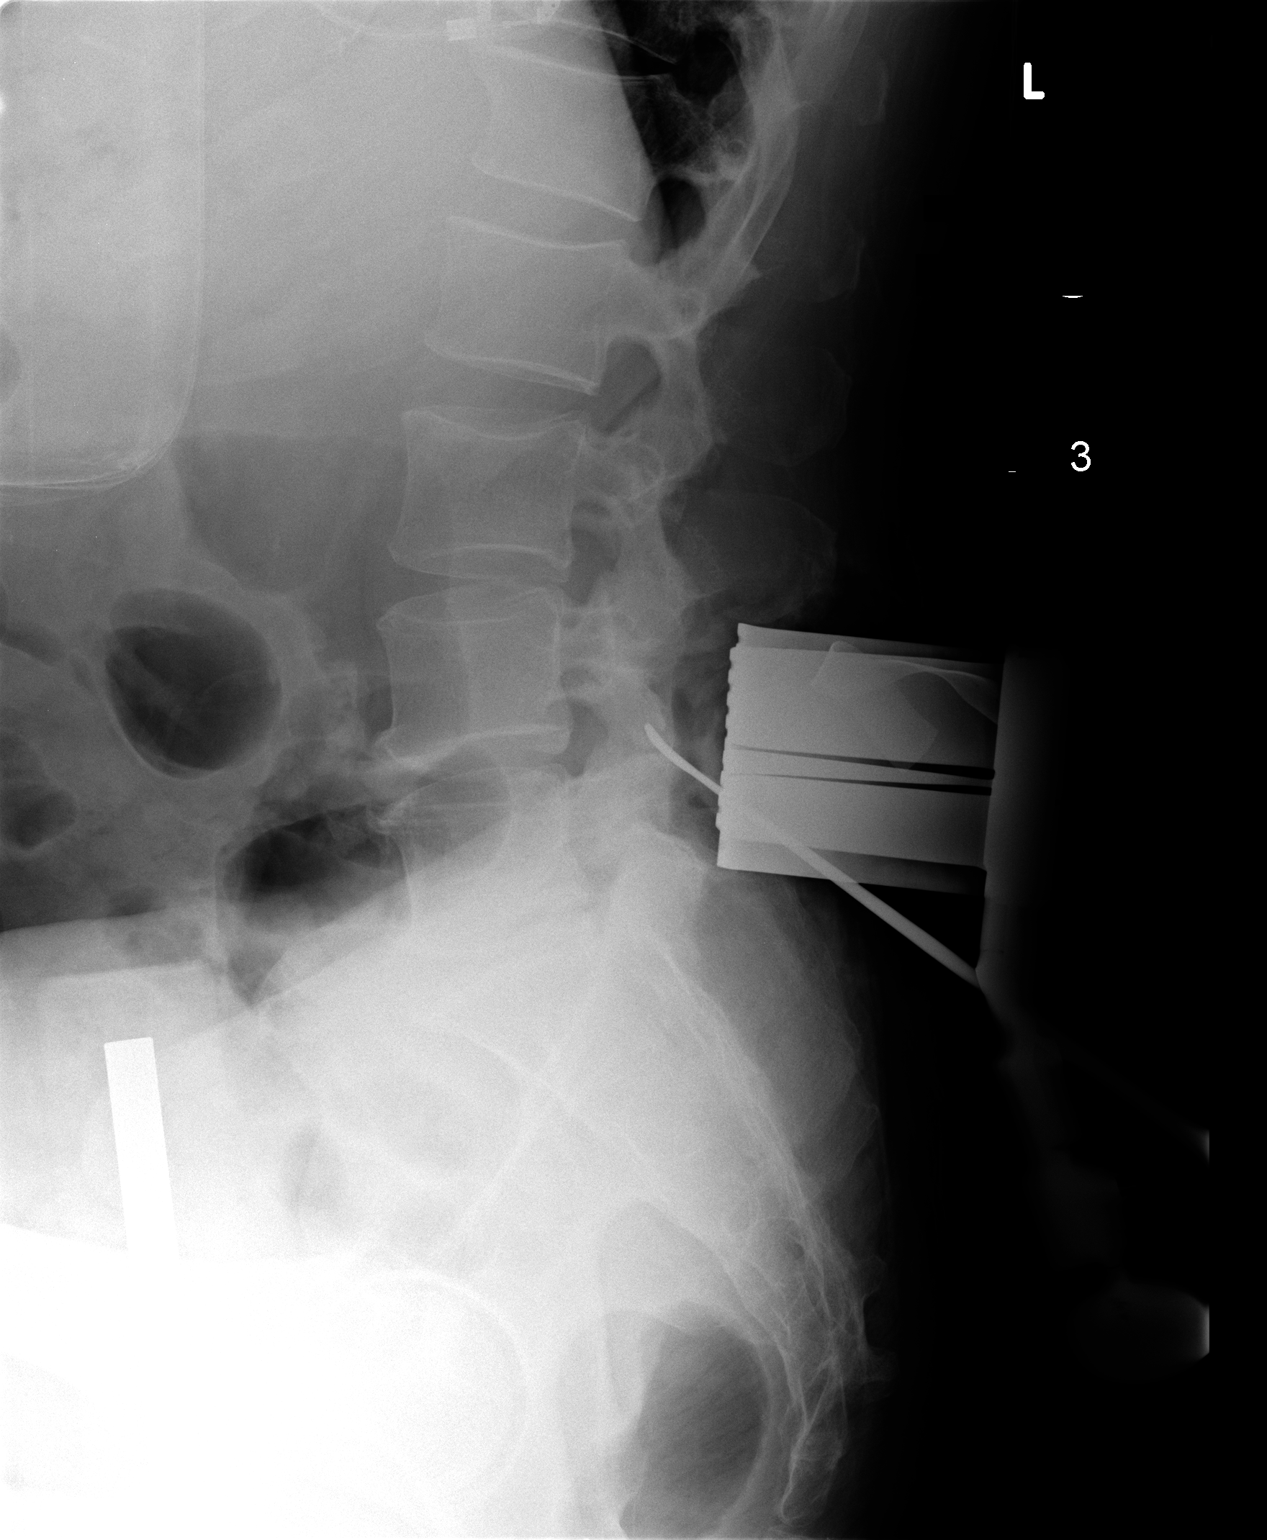

[1 of 1 positions shown; findings below may reference images not displayed]

FINDINGS: Portable cross-table lateral intraoperative view of the
lumbar spine labeled image #3 at [8N] hours.

There is a surgical probe at the inferior L4 level, tip projecting
over the inferior articulating facet of L4.
IMPRESSION: Intraoperative localization as above.

## 2009-06-30 IMAGING — CR DG OR PORTABLE SPINE
1 series · 1 of 1 positions shown · non-contrast
Comparison: [DATE] at [DATE] p.m.; [DATE]

CLINICAL DATA: Ruptured disc at L4-5.

PORTABLE SPINE

[view not recorded]
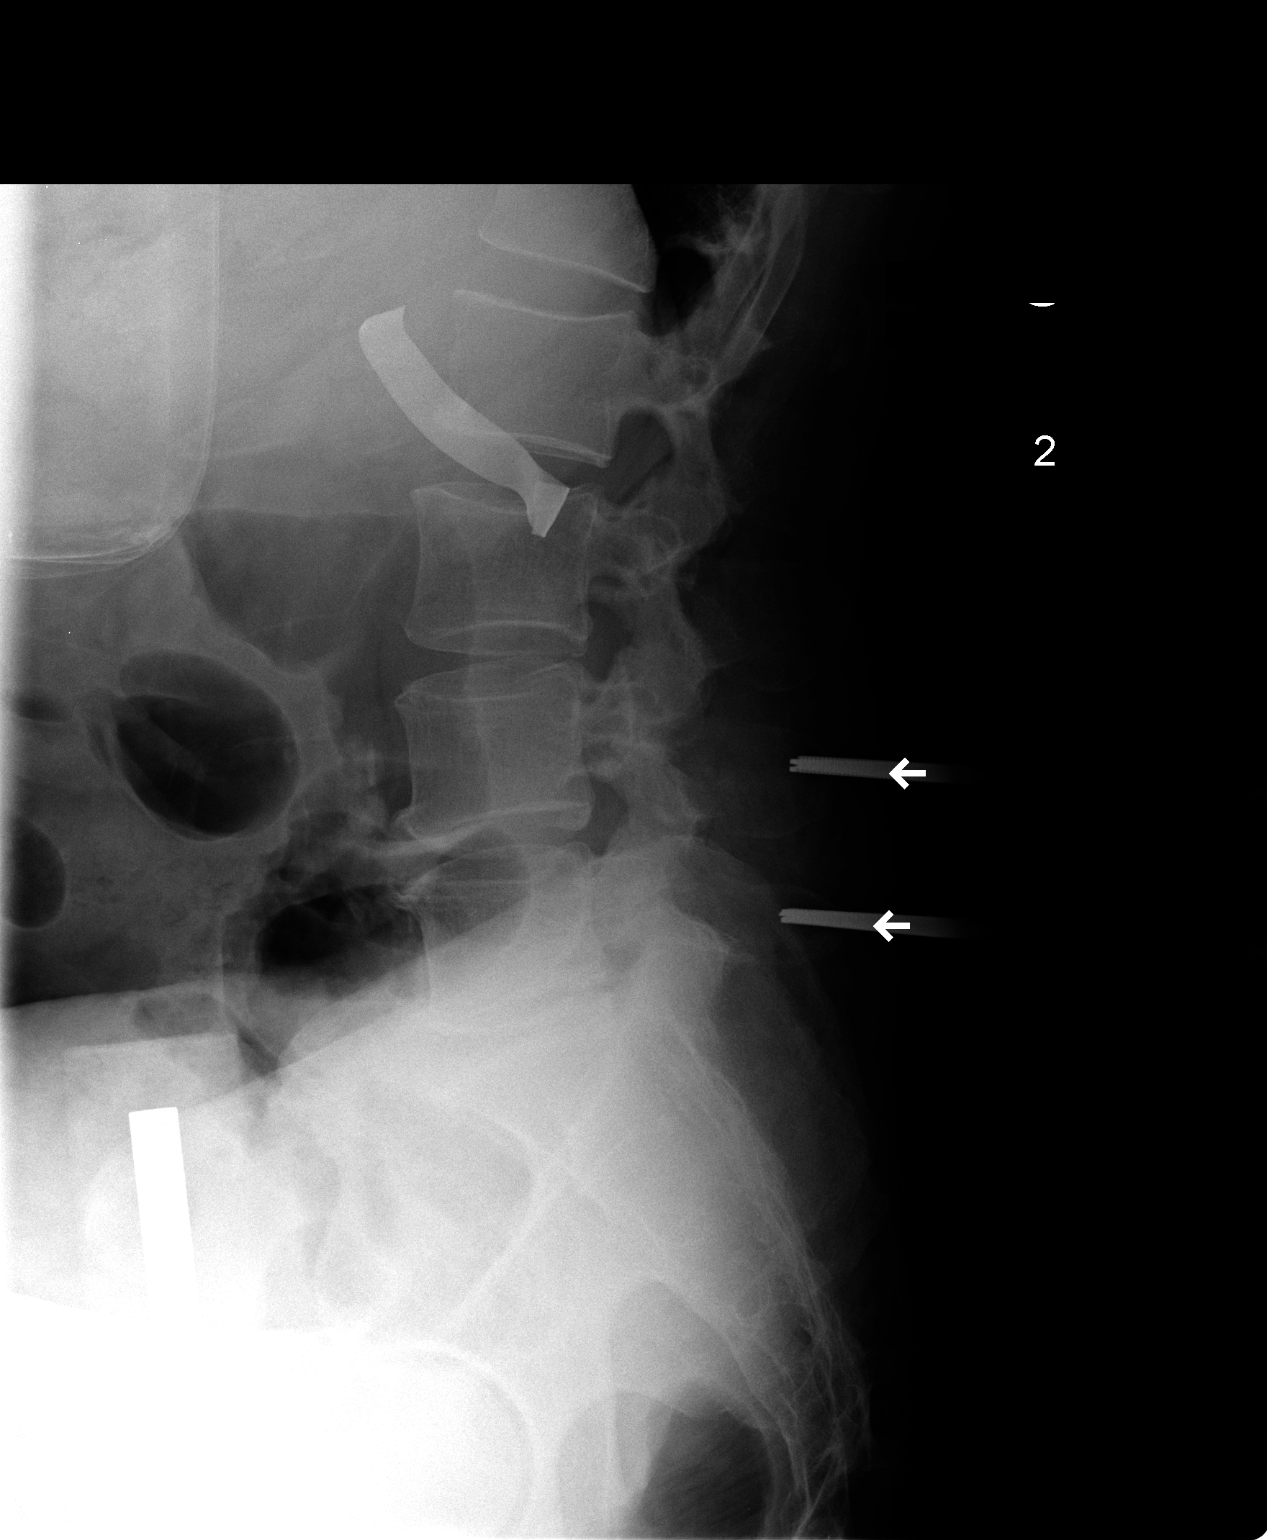

[1 of 1 positions shown; findings below may reference images not displayed]

FINDINGS: The upper probe projects over the L4 spinous process.
Lower probe projects over the L5 spinous process.
IMPRESSION: 1.  Probe localization of the L4 spinous process and the L5 spinous
process.

## 2009-06-30 IMAGING — CR DG OR PORTABLE SPINE
1 series · 1 of 1 positions shown · non-contrast
Comparison: [57] hours the same day and earlier.

CLINICAL DATA: 43-year-old male undergoing lumbar surgery.

PORTABLE SPINE

[view not recorded]
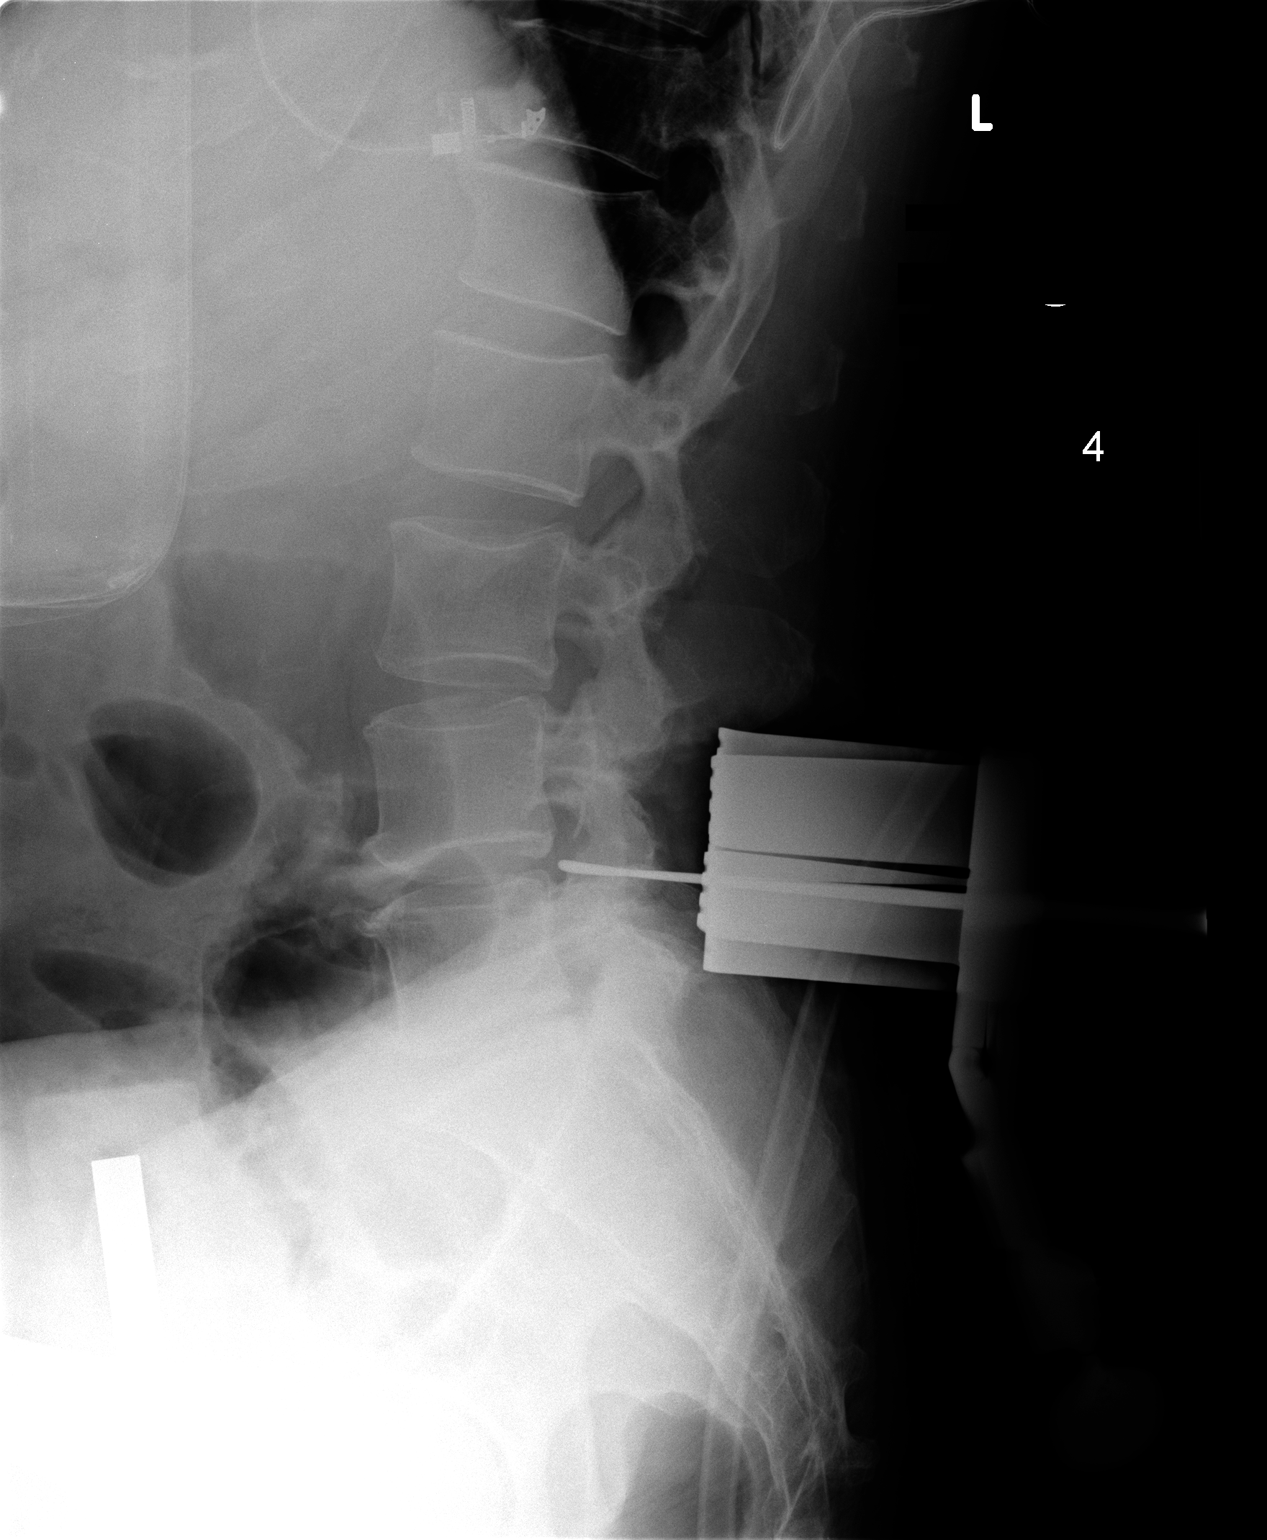

[1 of 1 positions shown; findings below may reference images not displayed]

FINDINGS: Portable cross-table lateral intraoperative view of the
lumbar spine labeled image #4 at [57] hours.

Surgical probe now directed at the L4-L5 disc space.
IMPRESSION: Intraoperative localization at L4-L5.

## 2009-06-30 IMAGING — CR DG OR PORTABLE SPINE
1 series · 1 of 1 positions shown · non-contrast
Comparison: Preoperative study [DATE].

CLINICAL DATA: 43-year-old male with ruptured disc at L4-L5.

PORTABLE SPINE

[view not recorded]
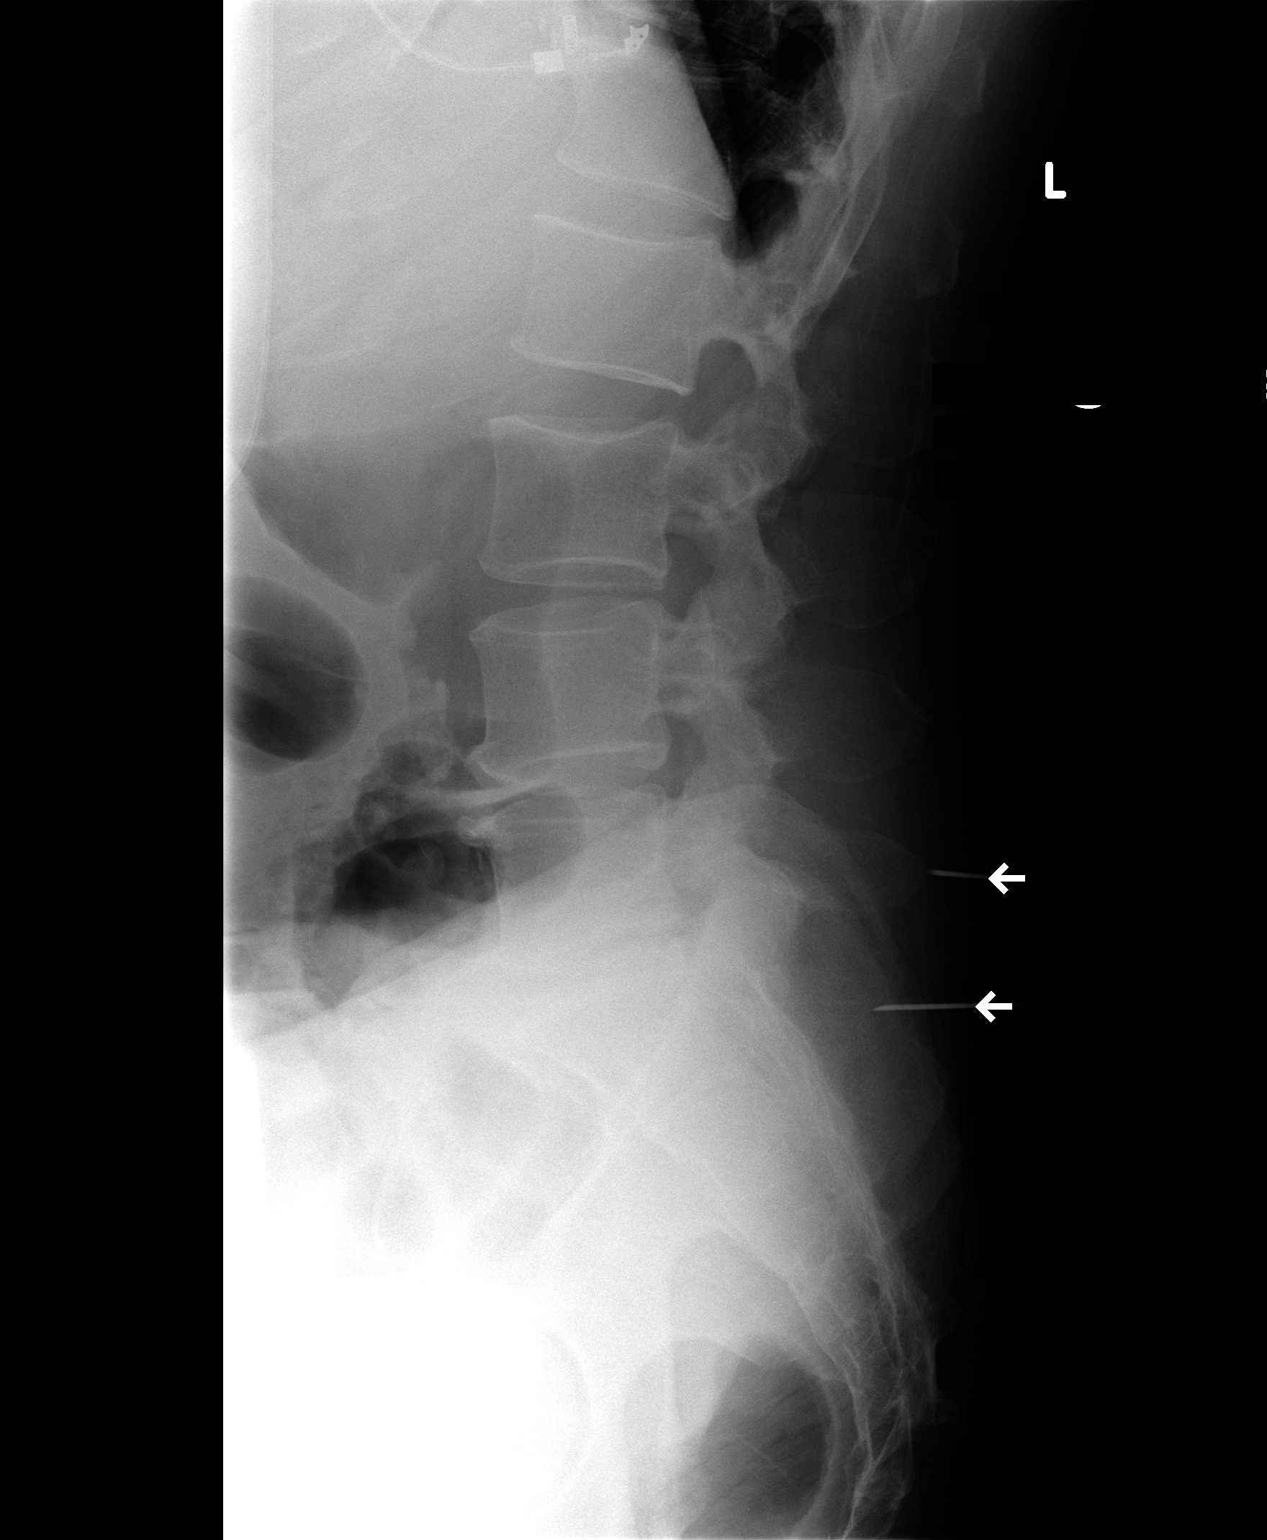

[1 of 1 positions shown; findings below may reference images not displayed]

FINDINGS: Portable cross-table lateral intraoperative view of the
lumbar spine labeled [C0] hours.

2 posterior needles are in place.  The more cephalad needle
projects at the L5 spinous process.  The more caudal needle
projects at the S1 vertebral level.
IMPRESSION: Intraoperative localization as above.

## 2010-04-24 LAB — ABO/RH: ABO/RH(D): O POS

## 2010-04-24 LAB — DIFFERENTIAL
Eosinophils Absolute: 0.1 10*3/uL (ref 0.0–0.7)
Lymphs Abs: 1.9 10*3/uL (ref 0.7–4.0)
Monocytes Absolute: 0.6 10*3/uL (ref 0.1–1.0)
Monocytes Relative: 7 % (ref 3–12)
Neutrophils Relative %: 72 % (ref 43–77)

## 2010-04-24 LAB — URINALYSIS, ROUTINE W REFLEX MICROSCOPIC
Bilirubin Urine: NEGATIVE
Hgb urine dipstick: NEGATIVE
Nitrite: NEGATIVE
Protein, ur: NEGATIVE mg/dL
Urobilinogen, UA: 1 mg/dL (ref 0.0–1.0)

## 2010-04-24 LAB — CBC
HCT: 43.2 % (ref 39.0–52.0)
Hemoglobin: 14.9 g/dL (ref 13.0–17.0)
MCV: 105.6 fL — ABNORMAL HIGH (ref 78.0–100.0)
RBC: 4.09 MIL/uL — ABNORMAL LOW (ref 4.22–5.81)
WBC: 9.4 10*3/uL (ref 4.0–10.5)

## 2010-04-24 LAB — COMPREHENSIVE METABOLIC PANEL
AST: 25 U/L (ref 0–37)
BUN: 20 mg/dL (ref 6–23)
CO2: 28 mEq/L (ref 19–32)
Chloride: 106 mEq/L (ref 96–112)
Creatinine, Ser: 1.06 mg/dL (ref 0.4–1.5)

## 2010-04-24 LAB — TYPE AND SCREEN: Antibody Screen: NEGATIVE

## 2010-04-24 LAB — APTT: aPTT: 30 seconds (ref 24–37)

## 2010-04-24 LAB — PROTIME-INR: INR: 1.06 (ref 0.00–1.49)

## 2012-10-16 ENCOUNTER — Other Ambulatory Visit: Payer: Self-pay | Admitting: Family Medicine

## 2012-10-16 DIAGNOSIS — I10 Essential (primary) hypertension: Secondary | ICD-10-CM

## 2012-10-22 ENCOUNTER — Other Ambulatory Visit: Payer: Self-pay

## 2012-10-28 ENCOUNTER — Other Ambulatory Visit: Payer: Self-pay

## 2012-11-10 ENCOUNTER — Ambulatory Visit
Admission: RE | Admit: 2012-11-10 | Discharge: 2012-11-10 | Disposition: A | Discharge status: Home / Self Care | Payer: BC Managed Care – PPO | Source: Ambulatory Visit | Attending: Family Medicine | Admitting: Family Medicine

## 2012-11-10 DIAGNOSIS — I10 Essential (primary) hypertension: Secondary | ICD-10-CM

## 2012-11-10 IMAGING — US US RENAL
1 series · 13 of 25 positions shown · non-contrast
Comparison: None.

CLINICAL DATA: Hypertension and worsening renal function.

EXAM:
RENAL/URINARY TRACT ULTRASOUND
RENAL DUPLEX ULTRASOUND

[Series 1: us renal · 0.32mm/px · 13 of 58 slices shown]
[im 1/58]
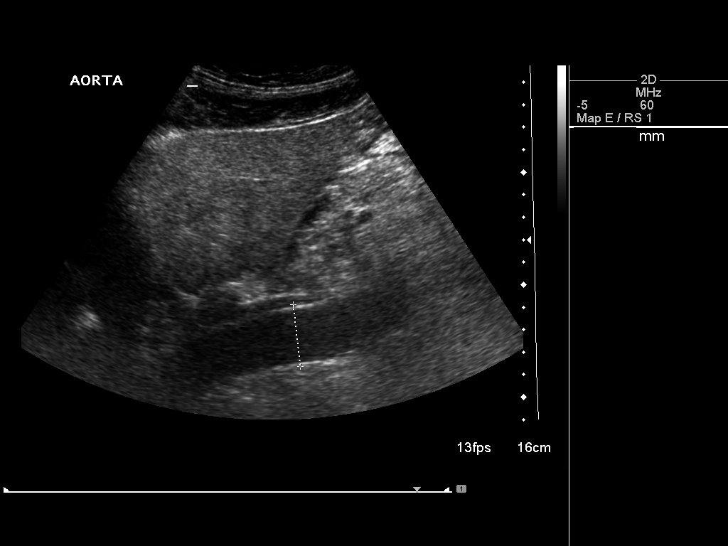
[im 5/58]
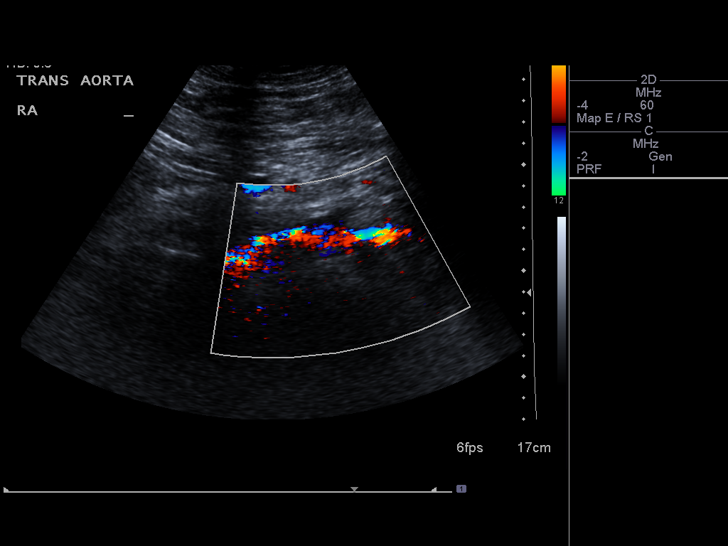
[im 10/58]
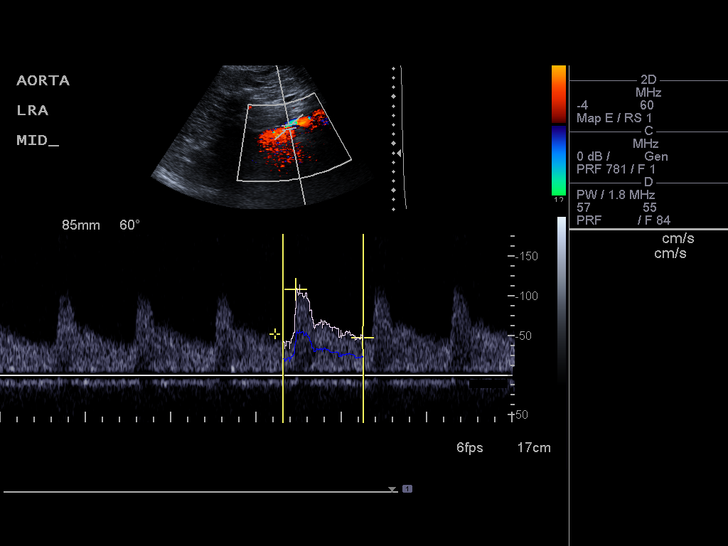
[im 15/58]
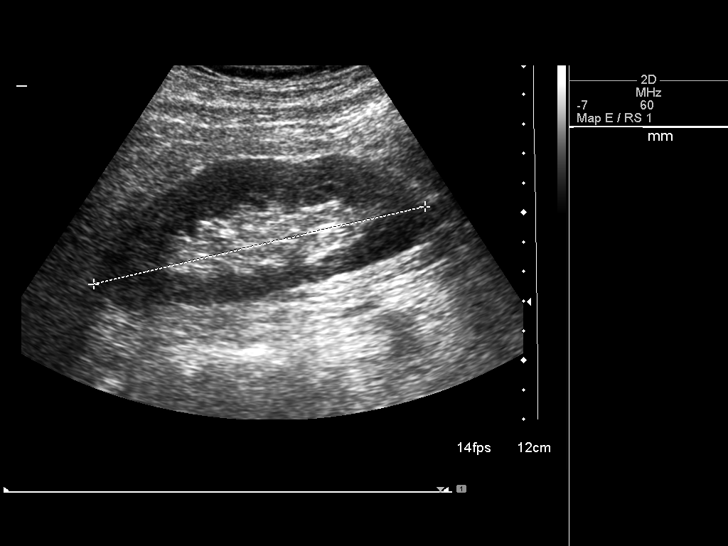
[im 20/58]
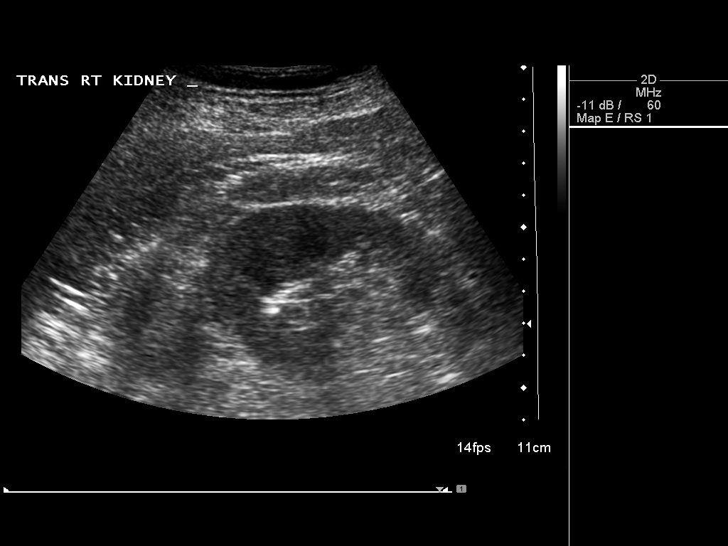
[im 24/58]
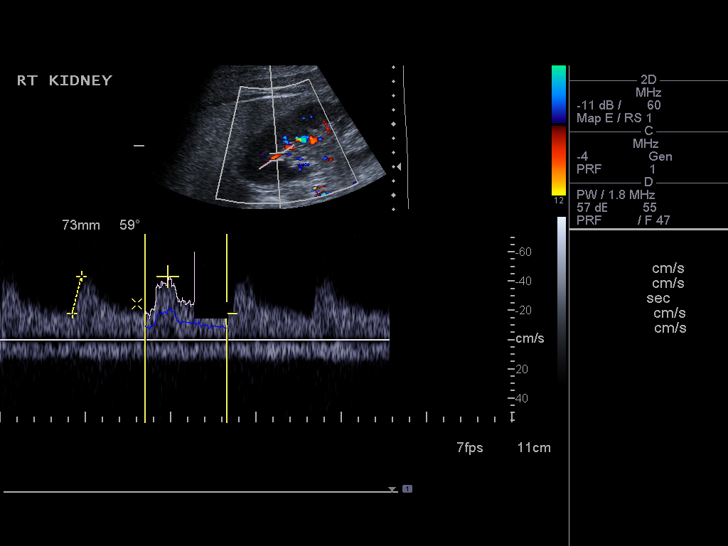
[im 29/58]
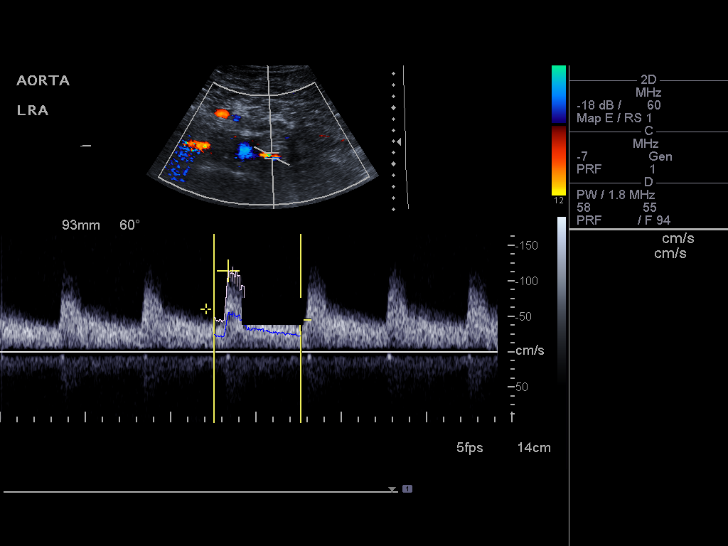
[im 34/58]
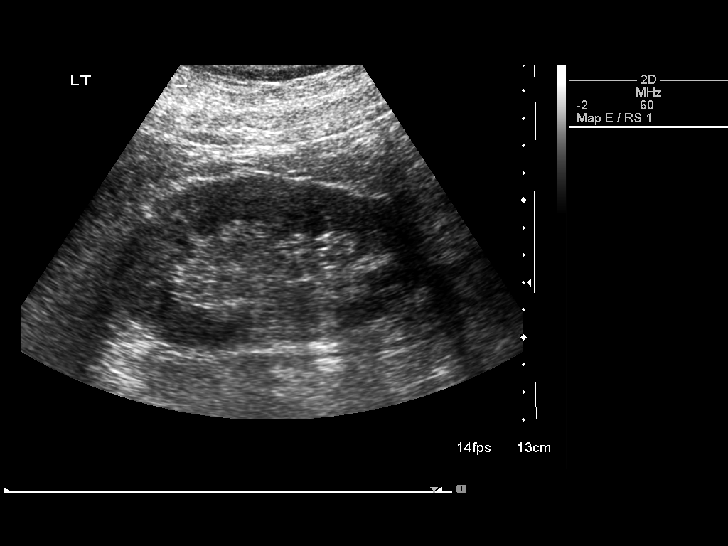
[im 39/58]
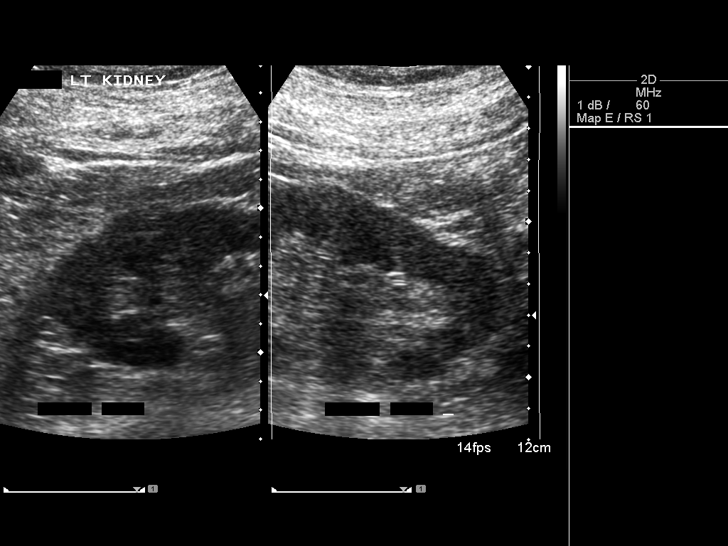
[im 43/58]
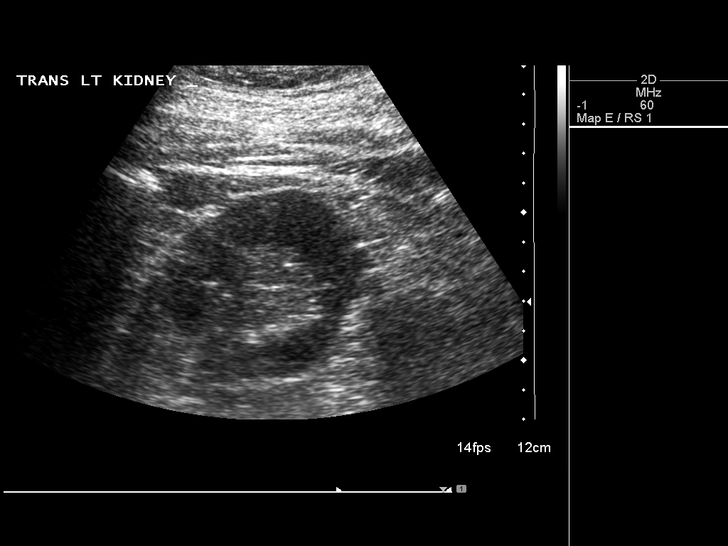
[im 48/58]
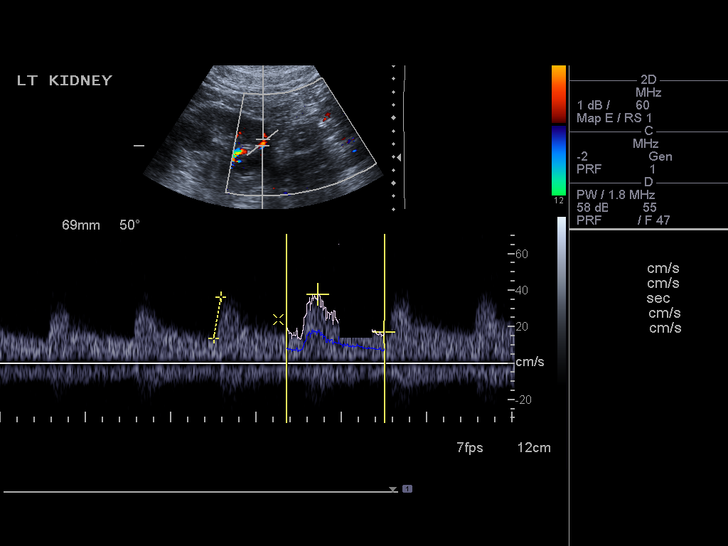
[im 53/58]
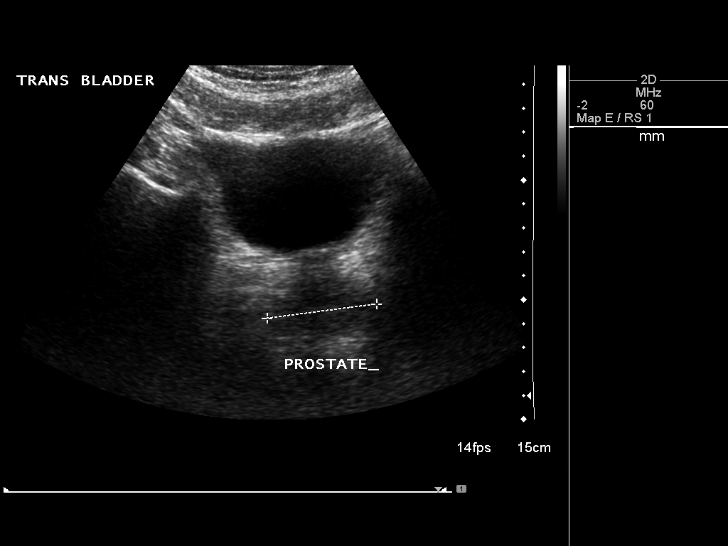
[im 58/58]
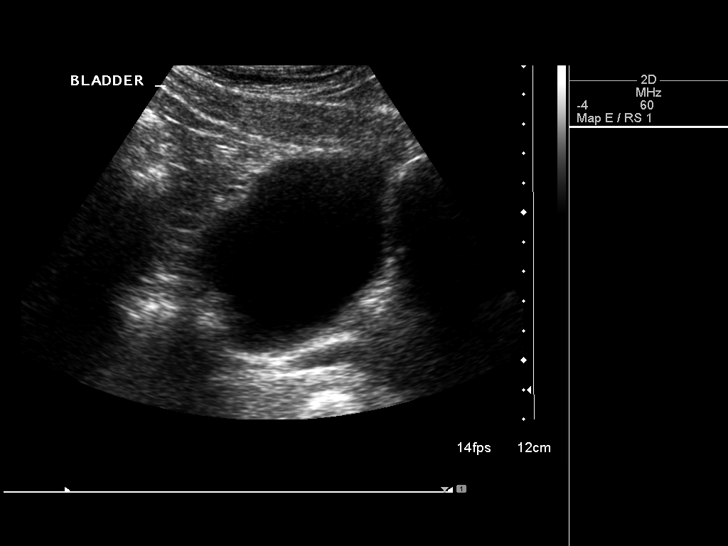

[13 of 25 positions shown; findings below may reference images not displayed]

FINDINGS: Right Kidney

Length: 11.5 cm Echogenicity within normal limits. No mass or
hydronephrosis visualized.

Left Kidney

Length: 11.7 cm Echogenicity within normal limits. No mass or
hydronephrosis visualized.

Bladder: No gross abnormality. Prostate measures 2.6 x 2.8 x 4.6 cm.

RENAL DUPLEX ULTRASOUND

Right Renal Artery Velocities

Origin:  118 cm/sec

Mid:  107 cm/sec

Hilum:  102 cm/sec

Interlobar:  43 cm/sec

Arcuate:  30 Cm/sec

Left Renal Artery Velocities

Origin:  113 cm/sec

Mid:  110 cm/sec

Hilum:  71 cm/sec

Interlobar:  38 cm/sec

Arcuate:  32 cm/sec

Aortic Velocity:  65 Cm/sec

Right Renal Aortic Ratios

Origin:

Mid:

Hilum: 1 point sec

Interlobar:

Arcuate:

Left Renal Aortic Ratios

Origin:

Mid:

Hilum:

Interlobar:

Arcuate:

Other findings: Color Doppler confirms patency of the renal artery
origins. Normal low resistance waveforms in the renal arteries
bilaterally. No evidence for renal artery stenosis. Bilateral renal
veins are patent.
IMPRESSION: Normal renal ultrasound and normal renal artery duplex exam. No
evidence for renal artery stenosis.

## 2013-04-09 ENCOUNTER — Other Ambulatory Visit: Payer: Self-pay | Admitting: Family Medicine

## 2013-04-09 ENCOUNTER — Ambulatory Visit
Admission: RE | Admit: 2013-04-09 | Discharge: 2013-04-09 | Disposition: A | Discharge status: Home / Self Care | Payer: BC Managed Care – PPO | Source: Ambulatory Visit | Attending: Family Medicine | Admitting: Family Medicine

## 2013-04-09 DIAGNOSIS — I10 Essential (primary) hypertension: Secondary | ICD-10-CM

## 2013-04-09 IMAGING — CR DG CHEST 2V
2 series · 2 of 2 positions shown · non-contrast
Comparison: Chest x-ray of [DATE]

CLINICAL DATA: Uncontrolled hypertension

EXAM:
CHEST  2 VIEW

[view not recorded (1 of 2)]
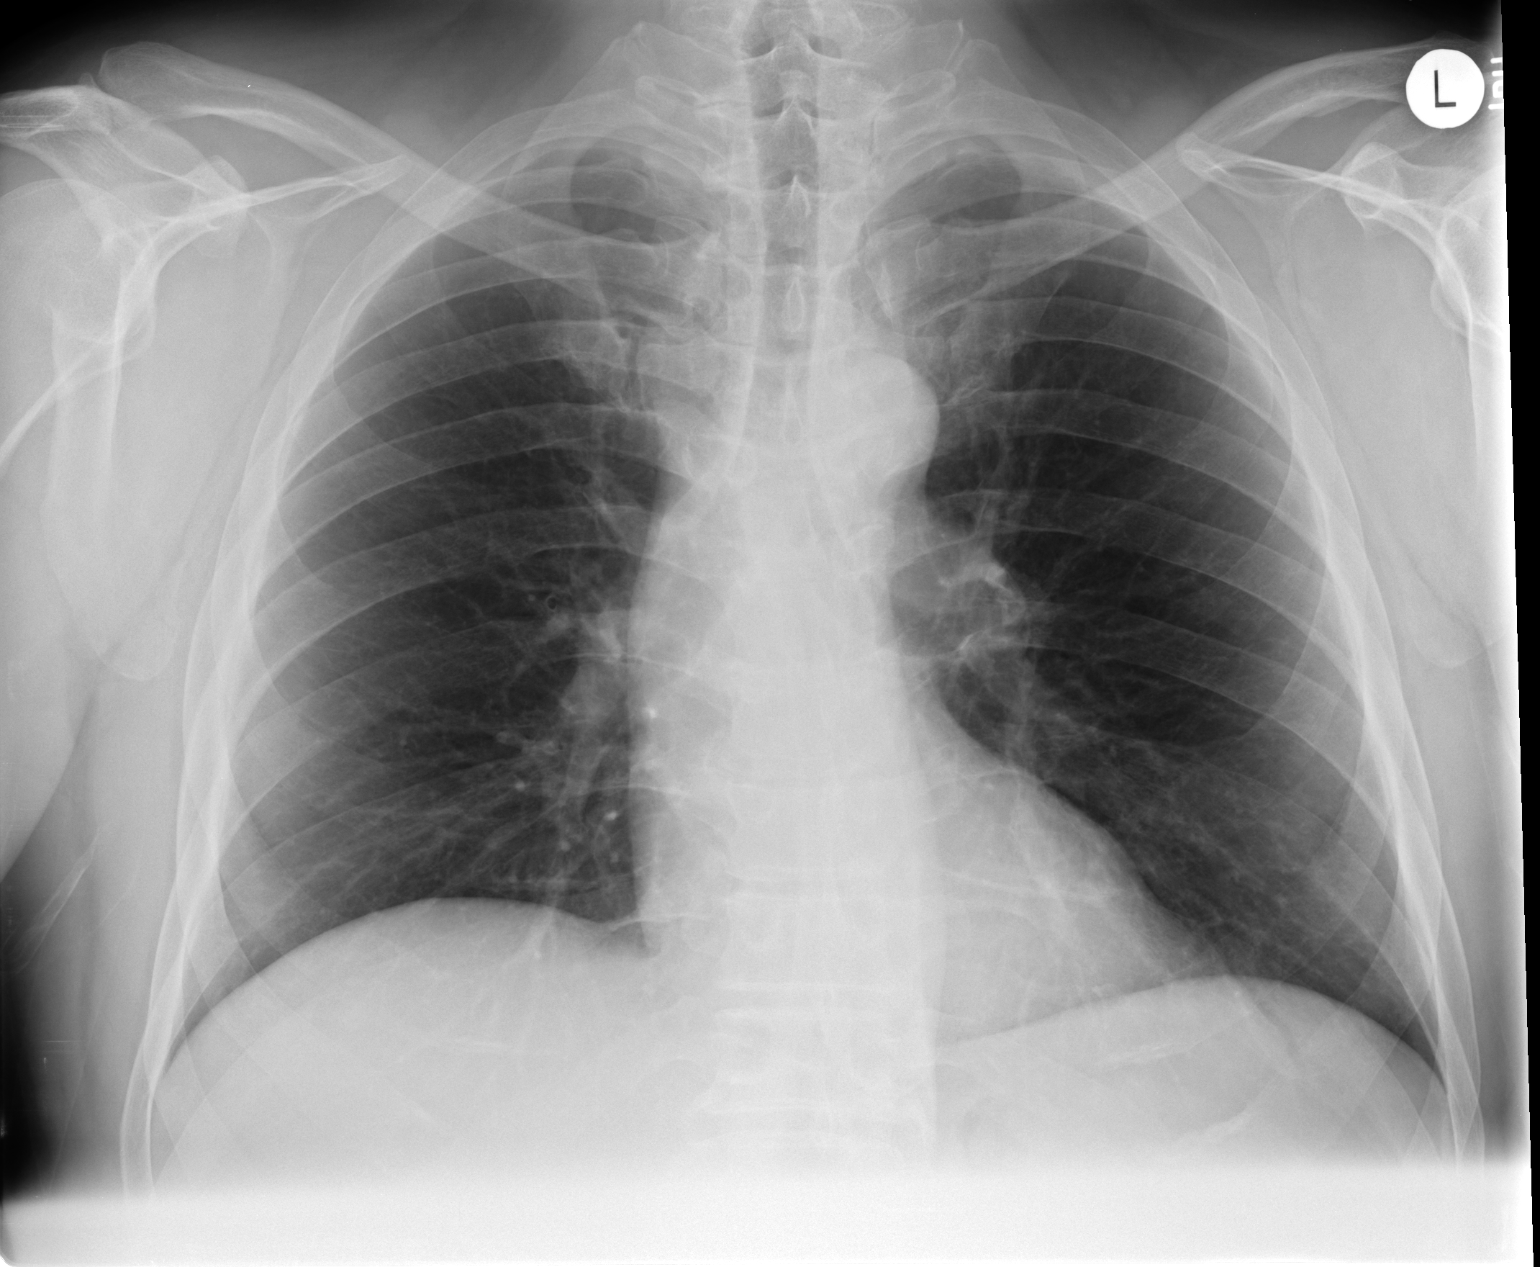

[view not recorded (2 of 2)]
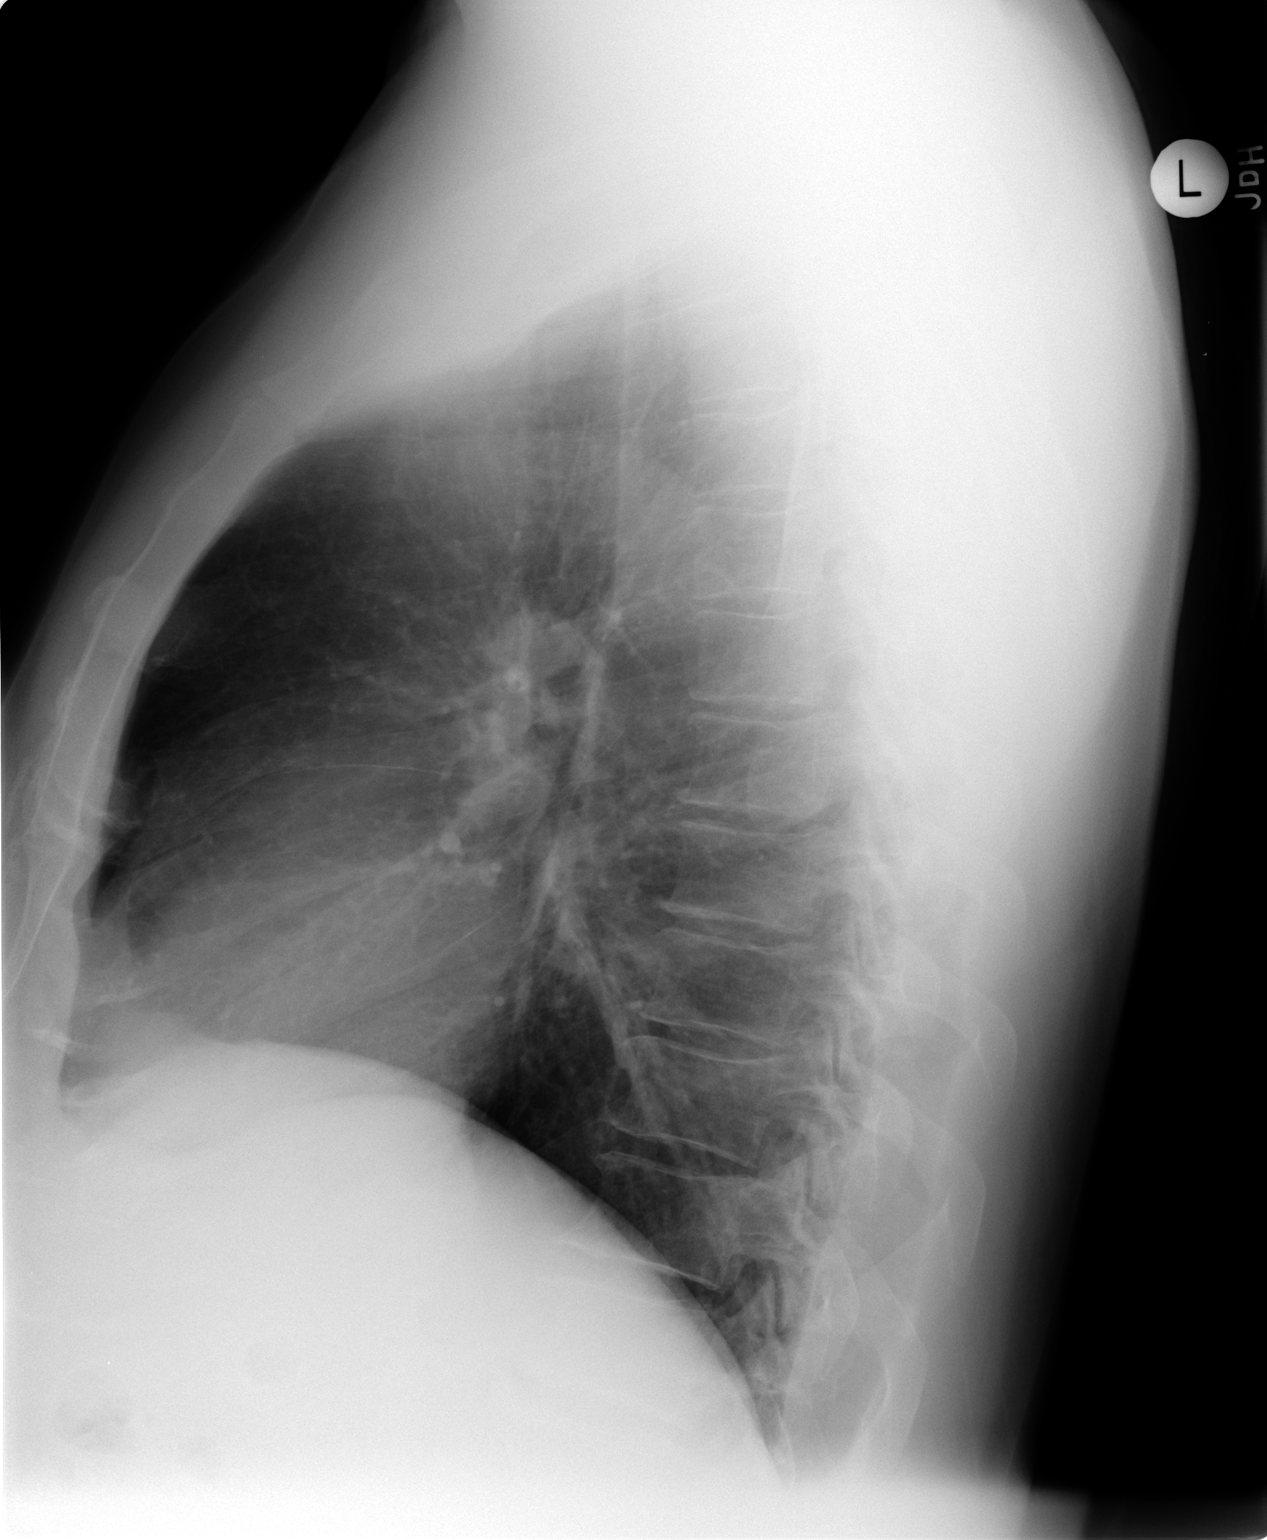

[2 of 2 positions shown; findings below may reference images not displayed]

FINDINGS: No active infiltrate or effusion is seen. Mediastinal contours are
stable. The heart is within normal limits in size. No bony
abnormality is seen.
IMPRESSION: No active lung disease.

## 2013-10-29 ENCOUNTER — Emergency Department (HOSPITAL_COMMUNITY)
Admission: EM | Admit: 2013-10-29 | Discharge: 2013-10-29 | Disposition: A | Discharge status: Home / Self Care | Payer: BC Managed Care – PPO | Attending: Emergency Medicine | Admitting: Emergency Medicine

## 2013-10-29 ENCOUNTER — Emergency Department (HOSPITAL_COMMUNITY): Payer: BC Managed Care – PPO

## 2013-10-29 ENCOUNTER — Encounter (HOSPITAL_COMMUNITY): Payer: Self-pay | Admitting: Emergency Medicine

## 2013-10-29 DIAGNOSIS — F411 Generalized anxiety disorder: Secondary | ICD-10-CM | POA: Insufficient documentation

## 2013-10-29 DIAGNOSIS — Z7982 Long term (current) use of aspirin: Secondary | ICD-10-CM | POA: Diagnosis not present

## 2013-10-29 DIAGNOSIS — R42 Dizziness and giddiness: Secondary | ICD-10-CM | POA: Diagnosis not present

## 2013-10-29 DIAGNOSIS — R0602 Shortness of breath: Secondary | ICD-10-CM | POA: Diagnosis not present

## 2013-10-29 DIAGNOSIS — IMO0002 Reserved for concepts with insufficient information to code with codable children: Secondary | ICD-10-CM | POA: Diagnosis not present

## 2013-10-29 DIAGNOSIS — E785 Hyperlipidemia, unspecified: Secondary | ICD-10-CM | POA: Insufficient documentation

## 2013-10-29 DIAGNOSIS — I160 Hypertensive urgency: Secondary | ICD-10-CM

## 2013-10-29 DIAGNOSIS — Z87891 Personal history of nicotine dependence: Secondary | ICD-10-CM | POA: Diagnosis not present

## 2013-10-29 DIAGNOSIS — I10 Essential (primary) hypertension: Secondary | ICD-10-CM | POA: Diagnosis not present

## 2013-10-29 DIAGNOSIS — Z79899 Other long term (current) drug therapy: Secondary | ICD-10-CM | POA: Diagnosis not present

## 2013-10-29 HISTORY — DX: Hyperlipidemia, unspecified: E78.5

## 2013-10-29 HISTORY — DX: Other seasonal allergic rhinitis: J30.2

## 2013-10-29 HISTORY — DX: Essential (primary) hypertension: I10

## 2013-10-29 LAB — TROPONIN I: Troponin I: <0.3 ng/mL (ref <0.30)

## 2013-10-29 LAB — CBC WITH DIFFERENTIAL/PLATELET
Basophils Absolute: 0 10*3/uL (ref 0.0–0.1)
Basophils Relative: 0 % (ref 0–1)
Eosinophils Absolute: 0 10*3/uL (ref 0.0–0.7)
Eosinophils Relative: 0 % (ref 0–5)
HCT: 42.9 % (ref 39.0–52.0)
Hemoglobin: 15.5 g/dL (ref 13.0–17.0)
Lymphocytes Relative: 22 % (ref 12–46)
Lymphs Abs: 2.1 10*3/uL (ref 0.7–4.0)
MCH: 35.2 pg — ABNORMAL HIGH (ref 26.0–34.0)
MCHC: 36.1 g/dL — ABNORMAL HIGH (ref 30.0–36.0)
MCV: 97.5 fL (ref 78.0–100.0)
Monocytes Absolute: 0.7 10*3/uL (ref 0.1–1.0)
Monocytes Relative: 7 % (ref 3–12)
Neutro Abs: 6.6 10*3/uL (ref 1.7–7.7)
Neutrophils Relative %: 71 % (ref 43–77)
Platelets: 174 10*3/uL (ref 150–400)
RBC: 4.4 MIL/uL (ref 4.22–5.81)
RDW: 11.8 % (ref 11.5–15.5)
WBC: 9.5 10*3/uL (ref 4.0–10.5)

## 2013-10-29 LAB — COMPREHENSIVE METABOLIC PANEL
ALT: 49 U/L (ref 0–53)
AST: 46 U/L — ABNORMAL HIGH (ref 0–37)
Albumin: 4.4 g/dL (ref 3.5–5.2)
Alkaline Phosphatase: 96 U/L (ref 39–117)
Anion gap: 18 — ABNORMAL HIGH (ref 5–15)
BUN: 24 mg/dL — ABNORMAL HIGH (ref 6–23)
CO2: 24 mEq/L (ref 19–32)
Calcium: 9.4 mg/dL (ref 8.4–10.5)
Chloride: 96 mEq/L (ref 96–112)
Creatinine, Ser: 1.29 mg/dL (ref 0.50–1.35)
GFR calc Af Amer: 73 mL/min — ABNORMAL LOW (ref >90)
GFR calc non Af Amer: 63 mL/min — ABNORMAL LOW (ref >90)
Glucose, Bld: 111 mg/dL — ABNORMAL HIGH (ref 70–99)
Potassium: 3.7 mEq/L (ref 3.7–5.3)
Sodium: 138 mEq/L (ref 137–147)
Total Bilirubin: 3.3 mg/dL — ABNORMAL HIGH (ref 0.3–1.2)
Total Protein: 7.8 g/dL (ref 6.0–8.3)

## 2013-10-29 LAB — CK: CK TOTAL: 251 U/L — AB (ref 7–232)

## 2013-10-29 LAB — D-DIMER, QUANTITATIVE: D-Dimer, Quant: <0.27 ug/mL-FEU (ref 0.00–0.48)

## 2013-10-29 IMAGING — CR DG CHEST 2V
3 series · 3 of 3 positions shown · non-contrast
Comparison: [DATE]

CLINICAL DATA: Difficulty breathing and dizziness; hypertension

EXAM:
CHEST  2 VIEW

[w chest lat (1 of 2)]
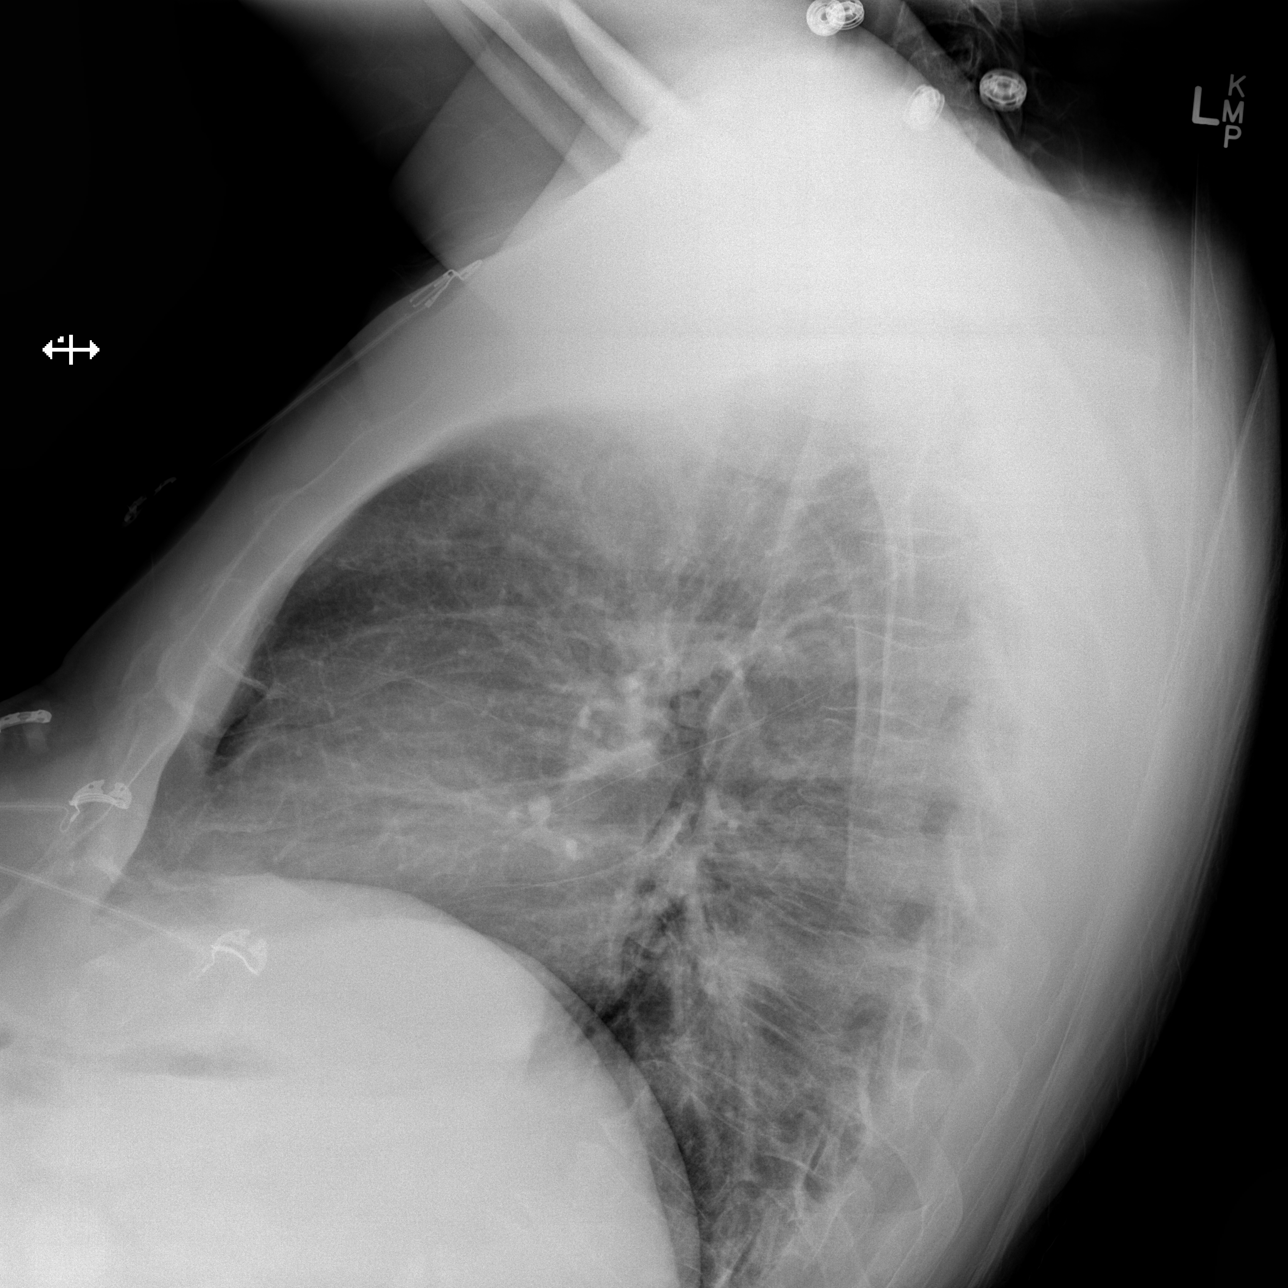

[w chest lat (2 of 2)]
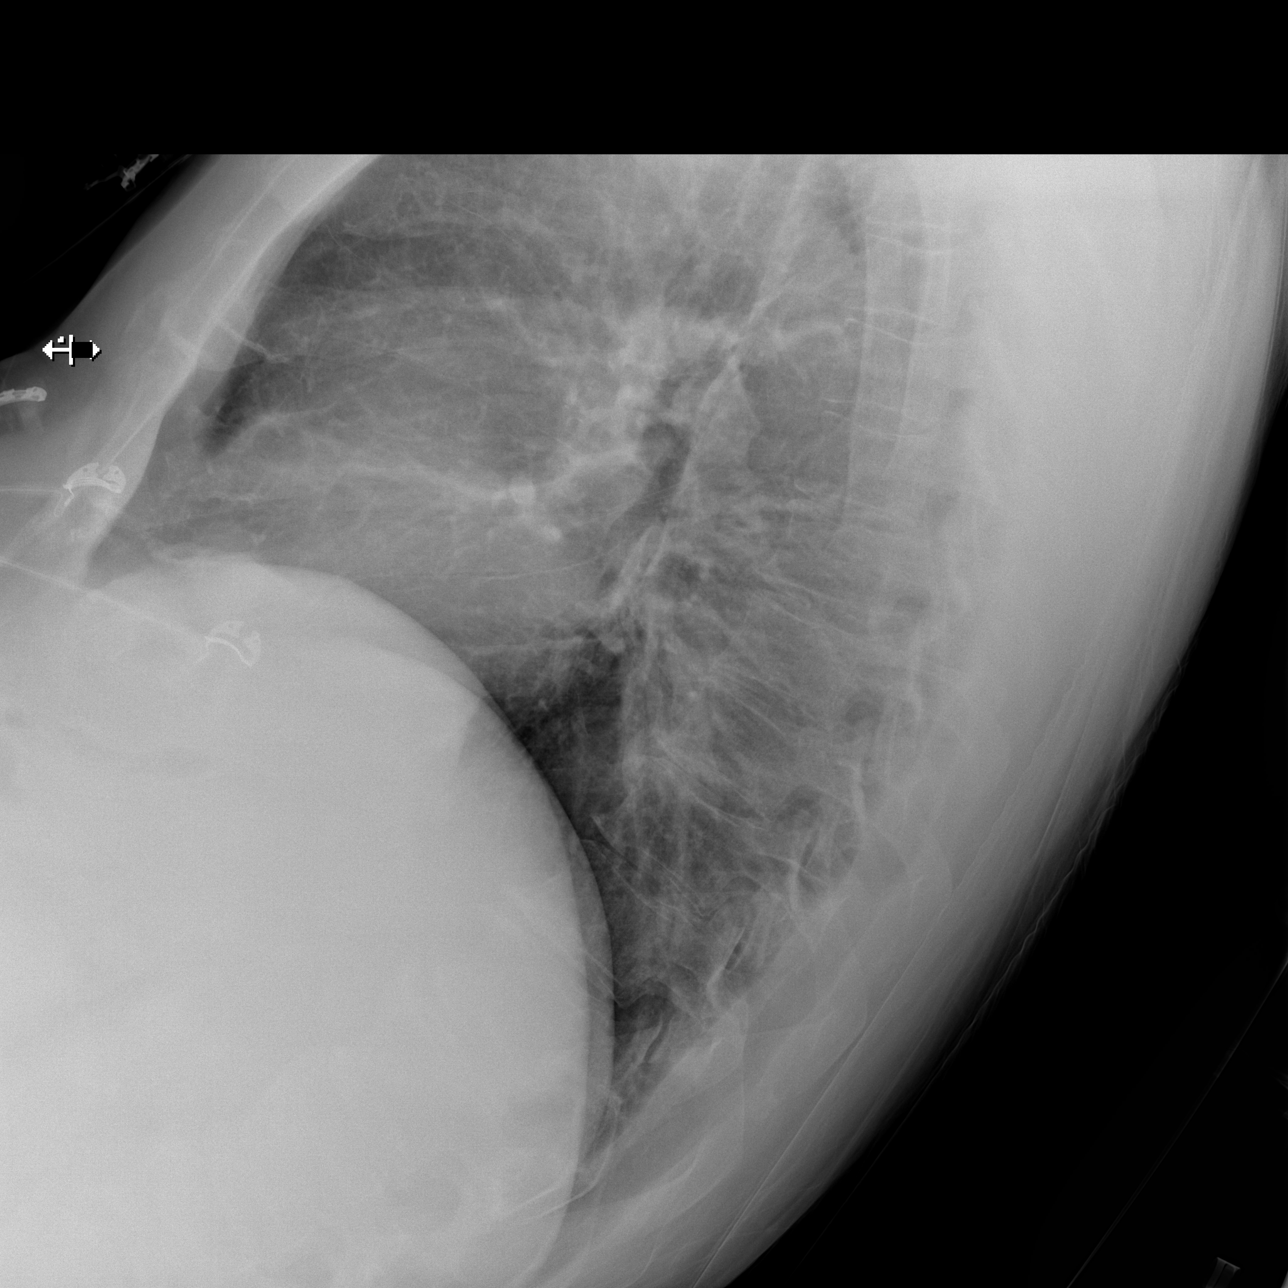

[x chest ap]
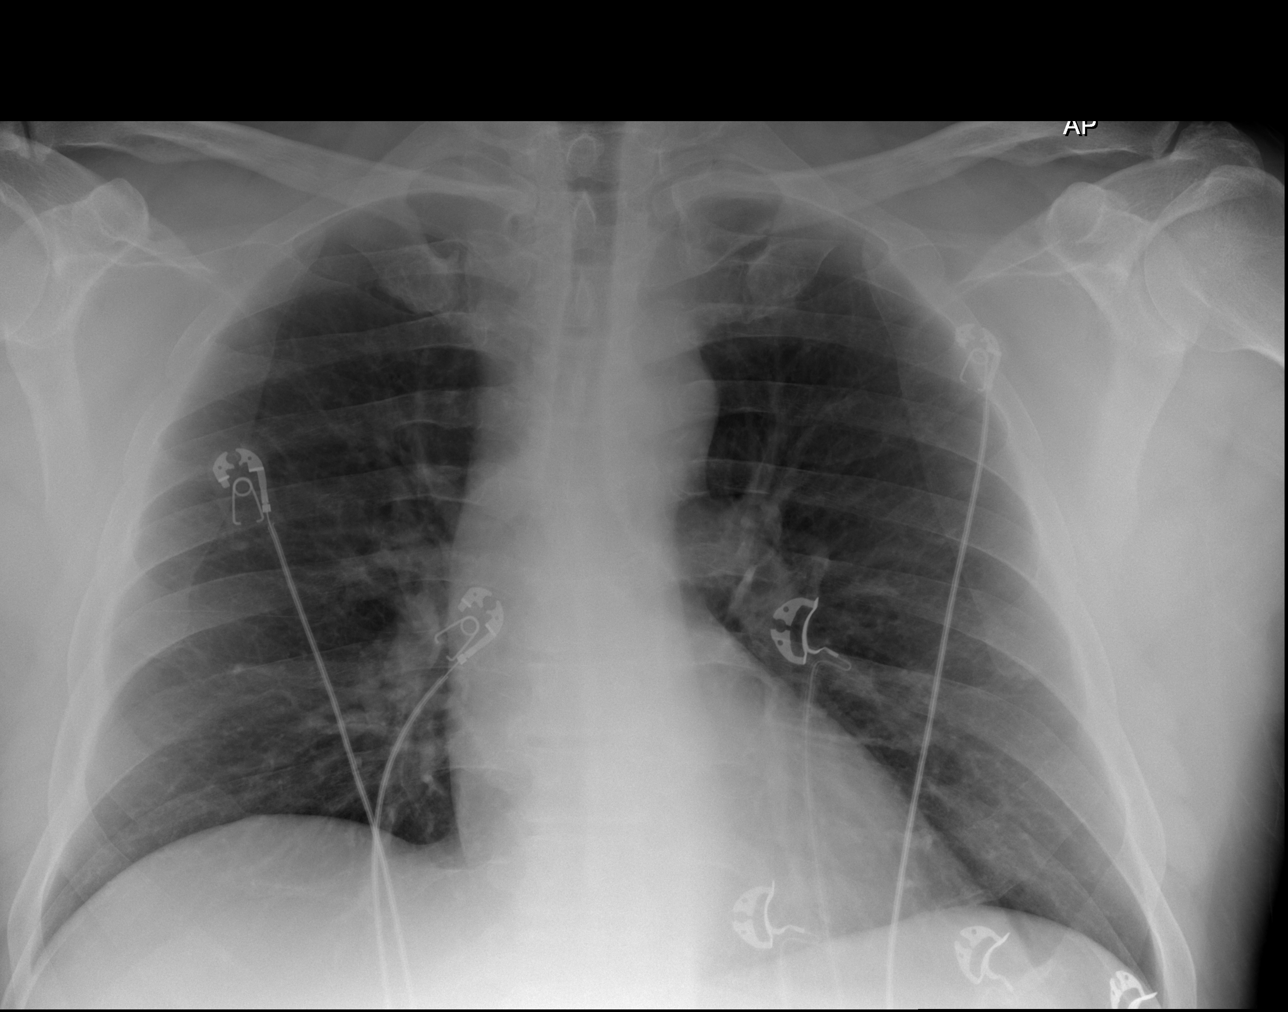

[3 of 3 positions shown; findings below may reference images not displayed]

FINDINGS: There is no edema or consolidation. The heart size and pulmonary
vascularity are normal. No adenopathy. No bone lesions.
IMPRESSION: No edema or consolidation.

## 2013-10-29 IMAGING — CT CT HEAD W/O CM
2 series · 16 of 30 positions shown, 20 images · non-contrast
Comparison: CT [DATE] .

CLINICAL DATA: Hypertension.

EXAM:
CT HEAD WITHOUT CONTRAST
TECHNIQUE: Contiguous axial images were obtained from the base of the skull
through the vertex without intravenous contrast.

[Series 2: head w/o · axial · non-contrast · 0.45mm/px · z∈[-108,+17]mm · 13 of 31 slices shown, 17 images]
[im 3/31  brain]
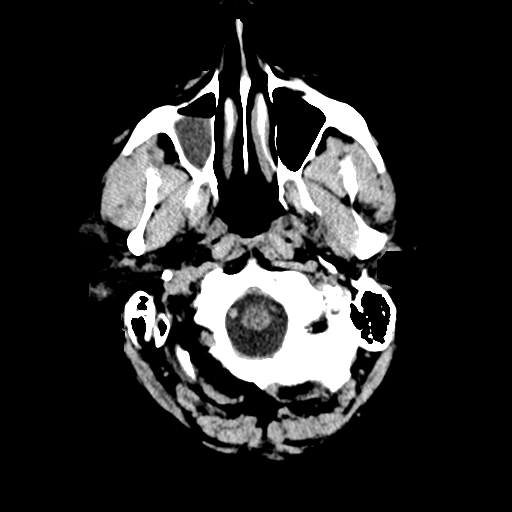
[im 3/31  bone]
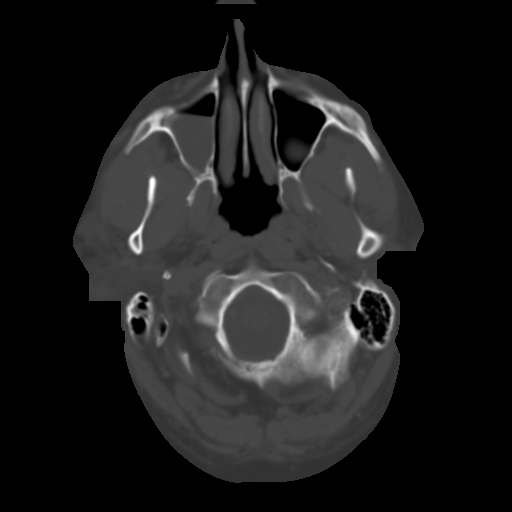
[im 5/31  brain]
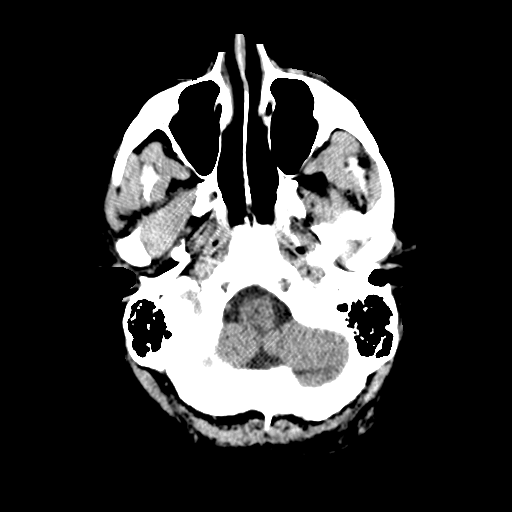
[im 7/31  brain]
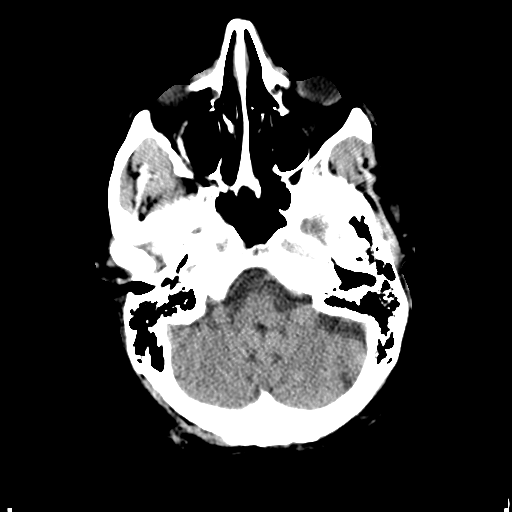
[im 9/31  brain]
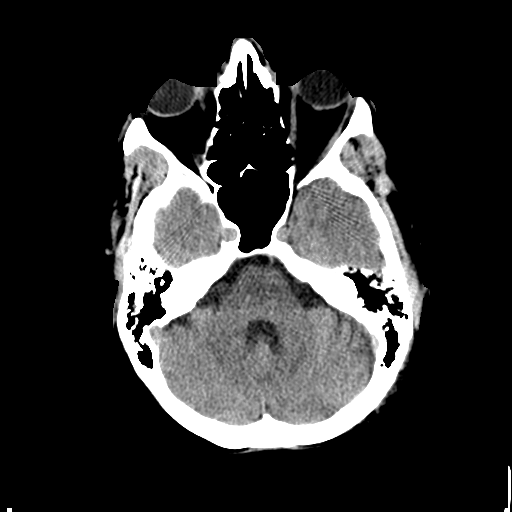
[im 11/31  brain]
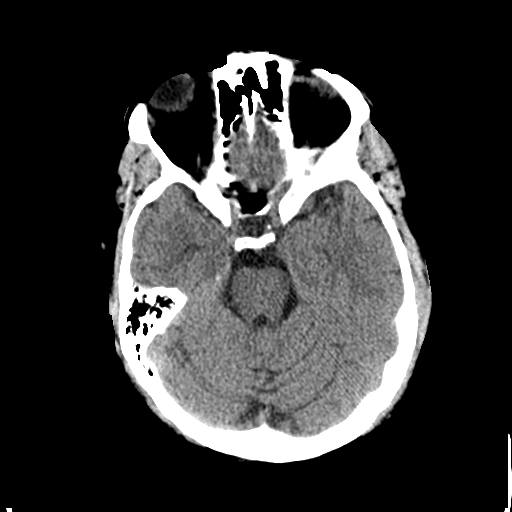
[im 11/31  bone]
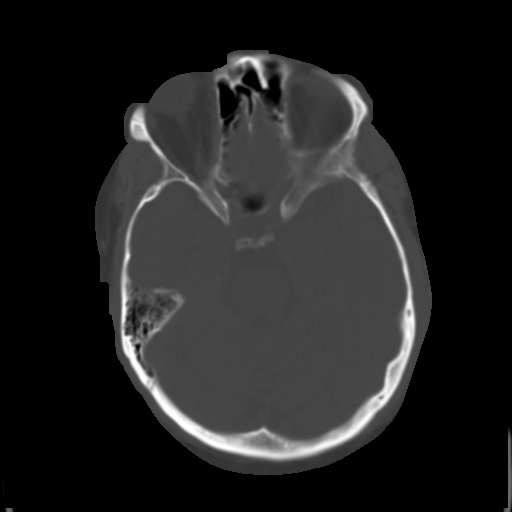
[im 13/31  brain]
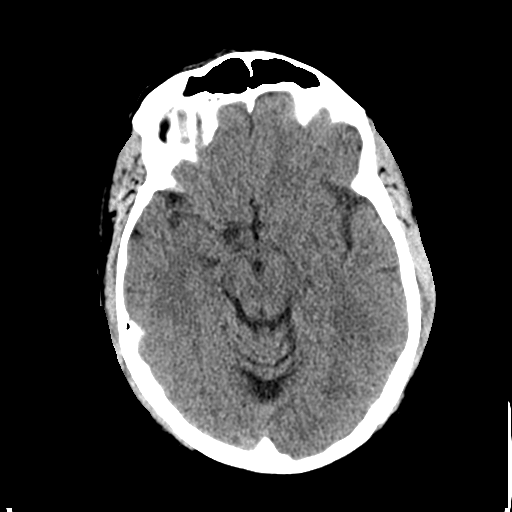
[im 16/31  brain]
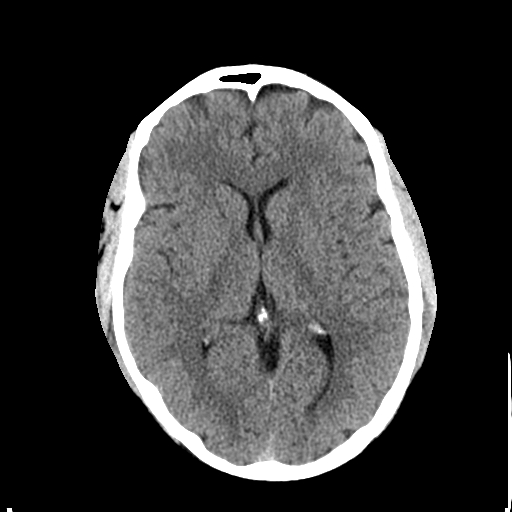
[im 18/31  brain]
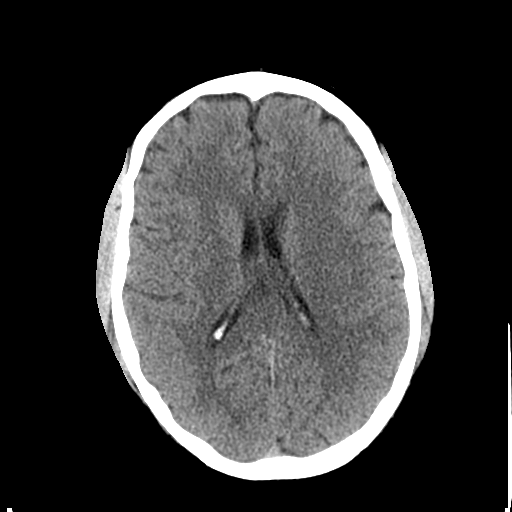
[im 20/31  brain]
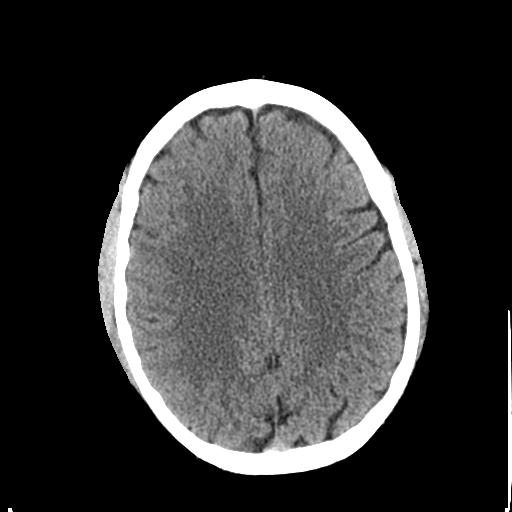
[im 20/31  bone]
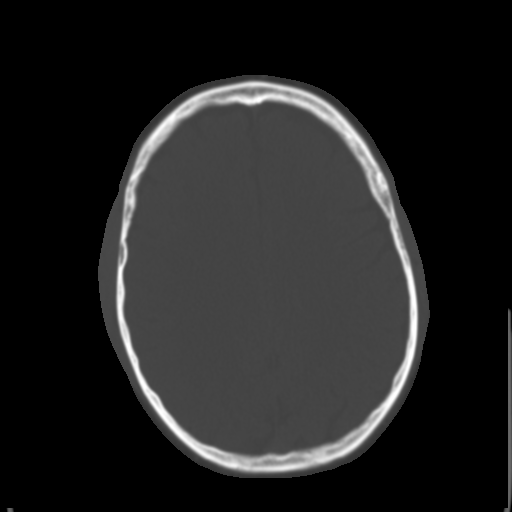
[im 22/31  brain]
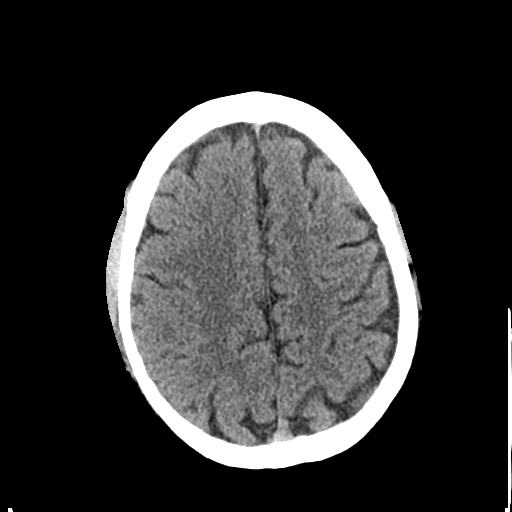
[im 24/31  brain]
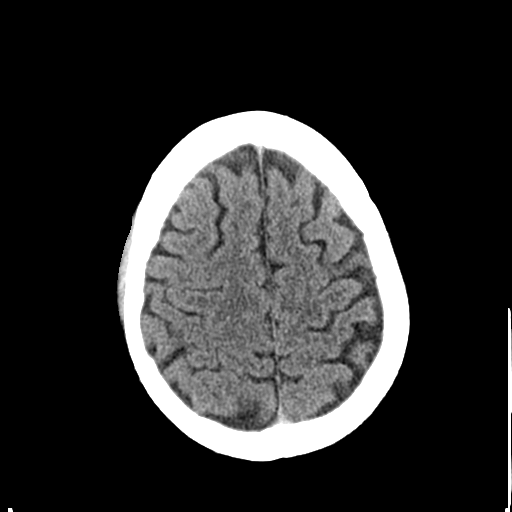
[im 26/31  brain]
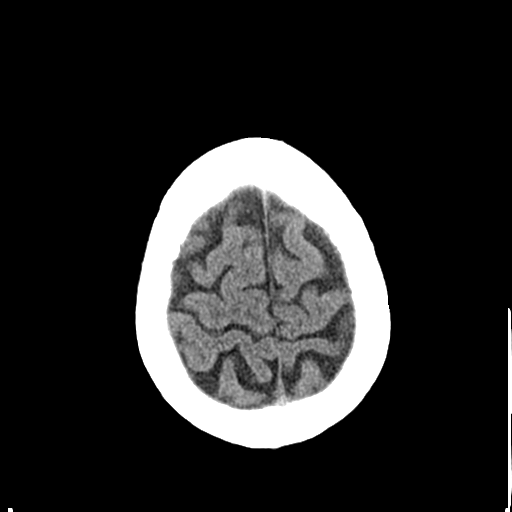
[im 28/31  brain]
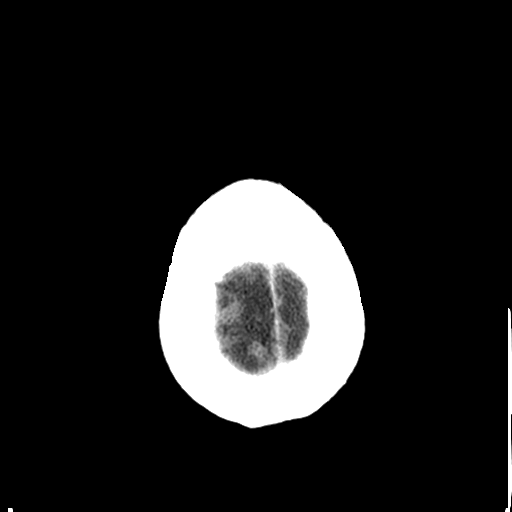
[im 28/31  bone]
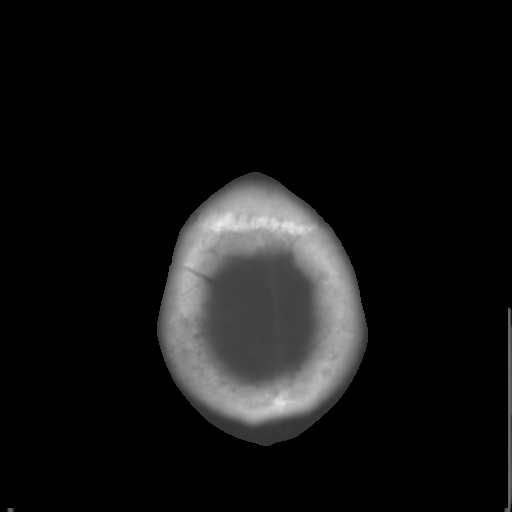

[Series 3: bone windows · axial · 0.45mm/px · z∈[-108,-68]mm · 3 of 31 slices shown]
[im 3/31  bone]
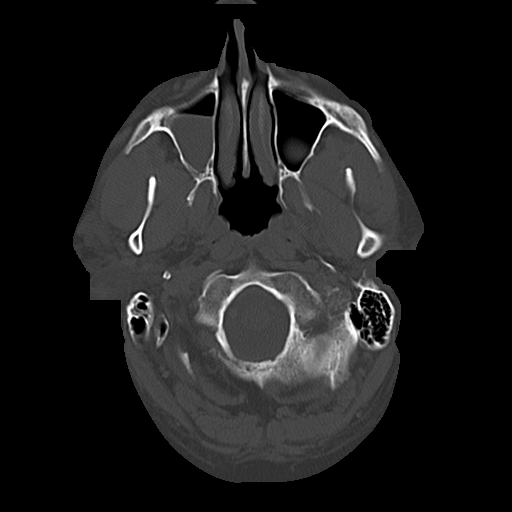
[im 7/31  bone]
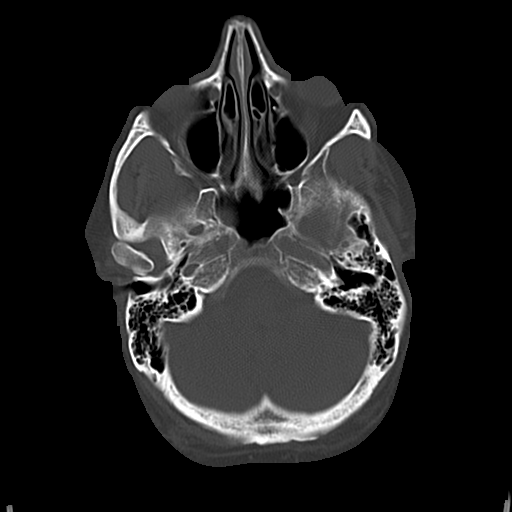
[im 11/31  bone]
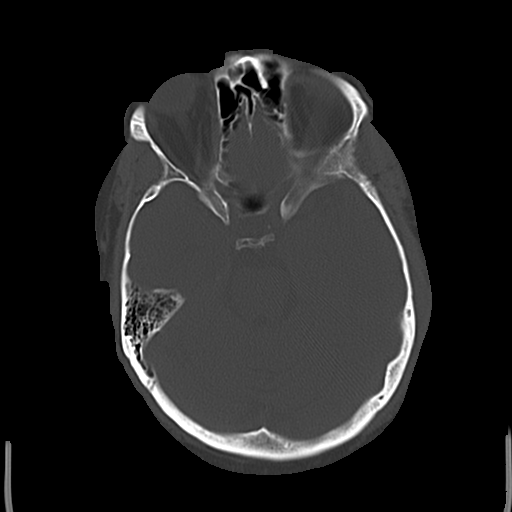

[16 of 30 positions shown; findings below may reference images not displayed]

FINDINGS: No intra-axial or extra-axial pathologic for blood collection. No
mass. No hydrocephalus. Bilateral maxillary sinus mucous retention
cysts. No acute bony abnormality.
IMPRESSION: No acute or focal abnormality.

## 2013-10-29 MED ORDER — SODIUM CHLORIDE 0.9 % IV BOLUS (SEPSIS)
1000.0000 mL | Freq: Once | INTRAVENOUS | Status: AC
  Administered 2013-10-29: 1000 mL via INTRAVENOUS

## 2013-10-29 MED ORDER — AMLODIPINE BESYLATE 5 MG PO TABS: 5.0000 mg | ORAL_TABLET | Freq: Every day | ORAL | Status: DC

## 2013-10-29 NOTE — ED Notes (Signed)
Patient transported to X-ray 

## 2013-10-29 NOTE — ED Provider Notes (Signed)
48 year old male, history of hypertension on several different medications including Lasix, losartan, metoprolol. He started a statin 2 weeks ago. He reports several days of hypertension and feeling generally weak and fatigued, today he states that he feels near syncopal but has not had any syncope, chest pain, difficulty breathing, fever, chills, nausea, vomiting, diarrhea or swelling of the lower extremities.  On exam the patient has a soft abdomen, clear heart and lung sounds, normal neurologic exam including strength, sensation and coordination and speech.  Check lytes, CK, LFT's, CT head   EKG Interpretation  Date/Time:  Thursday October 29 2013 13:07:06 EDT Ventricular Rate:  111 PR Interval:  182 QRS Duration: 104 QT Interval:  342 QTC Calculation: 465 R Axis:   -68 Text Interpretation:  Sinus tachycardia Atrial premature complex Left anterior fascicular block Abnormal R-wave progression, late transition Left ventricular hypertrophy Baseline wander in lead(s) V2 Since last tracing Rate faster lvh present on prior Confirmed by Antonio Creswell  MD, Davine Coba ([id number]) on 10/29/2013 2:25:07 PM       Medical screening examination/treatment/procedure(s) were conducted as a shared visit with non-physician practitioner(s) and myself.  I personally evaluated the patient during the encounter.  Clinical Impression:   Final diagnoses:  Hypertensive urgency         Vida Roller, MD 10/30/13 [phone]

## 2013-10-29 NOTE — ED Notes (Signed)
Patient transported to CT 

## 2013-10-29 NOTE — ED Provider Notes (Signed)
CSN: [id number]     Arrival date & time 10/29/13  1256 History   First MD Initiated Contact with Patient 10/29/13 1303     Chief Complaint  Patient presents with  . Hypertension  . Shortness of Breath  . Weakness  . Dizziness     (Consider location/radiation/quality/duration/timing/severity/associated sxs/prior Treatment) HPI Patient presents to the emergency department with elevated blood pressure.  Mild, dizziness, occasional, shortness of breath and just doesn't feel right over the last 3 days.  Patient, states, that about an hour before arrival his symptoms got worse with tingling in both arms.  Patient, states, that he did not have any chest pain, nausea, vomiting, diarrhea, weakness, numbness, abdominal pain, back pain, neck pain, blurred vision, decreased urination, fever, runny nose, sore throat, or syncope.  The patient, states, that he did not take any medications prior to arrival.  He states that he had an increase in his Cozaar dosage due to his elevated blood pressure Past Medical History  Diagnosis Date  . Hypertension   . Hyperlipidemia   . Seasonal allergies    Past Surgical History  Procedure Laterality Date  . Back surgery     No family history on file. History  Substance Use Topics  . Smoking status: Former Games developer  . Smokeless tobacco: Not on file  . Alcohol Use: Yes    Review of Systems  All other systems negative except as documented in the HPI. All pertinent positives and negatives as reviewed in the HPI.  Allergies  Review of patient's allergies indicates no known allergies.  Home Medications   Prior to Admission medications   Medication Sig Start Date End Date Taking? Authorizing Provider  aspirin EC 81 MG tablet Take 81 mg by mouth daily.   Yes Historical Provider, MD  atorvastatin (LIPITOR) 10 MG tablet Take 10 mg by mouth daily.   Yes Historical Provider, MD  fluticasone (FLONASE) 50 MCG/ACT nasal spray Place 2 sprays into both nostrils daily.    Yes Historical Provider, MD  furosemide (LASIX) 20 MG tablet Take 20 mg by mouth daily.   Yes Historical Provider, MD  loratadine (CLARITIN) 10 MG tablet Take 10 mg by mouth daily.   Yes Historical Provider, MD  losartan (COZAAR) 50 MG tablet Take 100 mg by mouth daily.   Yes Historical Provider, MD  metoprolol succinate (TOPROL-XL) 100 MG 24 hr tablet Take 100 mg by mouth daily. Take with or immediately following a meal.   Yes Historical Provider, MD   BP 165/112  Pulse 104  Temp(Src) 99.2 F (37.3 C) (Oral)  Resp 16  SpO2 96% Physical Exam  Nursing note and vitals reviewed. Constitutional: He is oriented to person, place, and time. He appears well-developed and well-nourished. No distress.  HENT:  Head: Normocephalic and atraumatic.  Mouth/Throat: Oropharynx is clear and moist.  Eyes: EOM are normal. Pupils are equal, round, and reactive to light.  Neck: Normal range of motion. Neck supple.  Cardiovascular: Normal rate, regular rhythm and normal heart sounds.  Exam reveals no gallop and no friction rub.   No murmur heard. Pulmonary/Chest: Effort normal and breath sounds normal. No respiratory distress.  Abdominal: Soft. Bowel sounds are normal. He exhibits no distension. There is no tenderness.  Neurological: He is alert and oriented to person, place, and time. He exhibits normal muscle tone. Coordination normal.  Skin: Skin is warm and dry. No rash noted. No erythema.  Psychiatric: His mood appears anxious.    ED Course  Procedures (  including critical care time) Labs Review Labs Reviewed  COMPREHENSIVE METABOLIC PANEL - Abnormal; Notable for the following:    Glucose, Bld 111 (*)    BUN 24 (*)    AST 46 (*)    Total Bilirubin 3.3 (*)    GFR calc non Af Amer 63 (*)    GFR calc Af Amer 73 (*)    Anion gap 18 (*)    All other components within normal limits  CBC WITH DIFFERENTIAL - Abnormal; Notable for the following:    MCH 35.2 (*)    MCHC 36.1 (*)    All other  components within normal limits  CK - Abnormal; Notable for the following:    Total CK 251 (*)    All other components within normal limits  TROPONIN I  D-DIMER, QUANTITATIVE  URINALYSIS, ROUTINE W REFLEX MICROSCOPIC    Imaging Review Dg Chest 2 View  10/29/2013   CLINICAL DATA:  Difficulty breathing and dizziness; hypertension  EXAM: CHEST  2 VIEW  COMPARISON:  April 09, 2013  FINDINGS: There is no edema or consolidation. The heart size and pulmonary vascularity are normal. No adenopathy. No bone lesions.  IMPRESSION: No edema or consolidation.   Electronically Signed   By: Bretta Bang M.D.   On: 10/29/2013 14:44   Ct Head Wo Contrast  10/29/2013   CLINICAL DATA:  Hypertension.  EXAM: CT HEAD WITHOUT CONTRAST  TECHNIQUE: Contiguous axial images were obtained from the base of the skull through the vertex without intravenous contrast.  COMPARISON:  CT 03/26/2008 .  FINDINGS: No intra-axial or extra-axial pathologic for blood collection. No mass. No hydrocephalus. Bilateral maxillary sinus mucous retention cysts. No acute bony abnormality.  IMPRESSION: No acute or focal abnormality.   Electronically Signed   By: Maisie Fus  Register   On: 10/29/2013 14:23     EKG Interpretation   Date/Time:  Thursday October 29 2013 13:07:06 EDT Ventricular Rate:  111 PR Interval:  182 QRS Duration: 104 QT Interval:  342 QTC Calculation: 465 R Axis:   -68 Text Interpretation:  Sinus tachycardia Atrial premature complex Left  anterior fascicular block Abnormal R-wave progression, late transition  Left ventricular hypertrophy Baseline wander in lead(s) V2 Since last  tracing Rate faster lvh present on prior Confirmed by MILLER  MD, BRIAN  ([id number]) on 10/29/2013 2:25:07 PM      Patient, blood pressure has responded here in the emergency department with fluids.  Patient is feeling some better. Gave the patient the plan and all questions were answered. He will be given follow up with cardiology for  further care on his blood pressure.    Carlyle Dolly, PA-C 10/31/13 [phone]

## 2013-10-29 NOTE — ED Notes (Addendum)
Pt presents with dizziness, slight shortness of breath, increased BP and states " does not feel right". Pt reports elevated BP earlier this week however felt well enough to go to work. Today 1 hour ago symptoms go worse. Spoke with PCP yesterday was told to increase Lasix from  to  last night. Taken as ordered.  Tingling down both arms. EDPA Thayer Ohm to evaluate pt upon arrival to room

## 2013-10-29 NOTE — ED Notes (Signed)
Pt states unable to use restroom at this time. Has urinal at bed.

## 2013-10-29 NOTE — Discharge Instructions (Signed)
Return here as needed.  Followup with the cardiologist provided.  Return here as needed

## 2013-11-01 NOTE — ED Provider Notes (Signed)
Medical screening examination/treatment/procedure(s) were conducted as a shared visit with non-physician practitioner(s) and myself.  I personally evaluated the patient during the encounter  Please see my separate respective documentation pertaining to this patient encounter   Vida Roller, MD 11/01/13 [phone]

## 2013-11-02 ENCOUNTER — Emergency Department (HOSPITAL_COMMUNITY)
Admission: EM | Admit: 2013-11-02 | Discharge: 2013-11-02 | Disposition: A | Discharge status: Home / Self Care | Payer: BC Managed Care – PPO | Attending: Emergency Medicine | Admitting: Emergency Medicine

## 2013-11-02 ENCOUNTER — Encounter (HOSPITAL_COMMUNITY): Payer: Self-pay | Admitting: Emergency Medicine

## 2013-11-02 DIAGNOSIS — I10 Essential (primary) hypertension: Secondary | ICD-10-CM | POA: Diagnosis not present

## 2013-11-02 DIAGNOSIS — Z7982 Long term (current) use of aspirin: Secondary | ICD-10-CM | POA: Diagnosis not present

## 2013-11-02 DIAGNOSIS — Z87891 Personal history of nicotine dependence: Secondary | ICD-10-CM | POA: Diagnosis not present

## 2013-11-02 DIAGNOSIS — E785 Hyperlipidemia, unspecified: Secondary | ICD-10-CM | POA: Insufficient documentation

## 2013-11-02 DIAGNOSIS — IMO0002 Reserved for concepts with insufficient information to code with codable children: Secondary | ICD-10-CM | POA: Diagnosis not present

## 2013-11-02 DIAGNOSIS — Z79899 Other long term (current) drug therapy: Secondary | ICD-10-CM | POA: Diagnosis not present

## 2013-11-02 LAB — BASIC METABOLIC PANEL
Anion gap: 17 — ABNORMAL HIGH (ref 5–15)
BUN: 17 mg/dL (ref 6–23)
CALCIUM: 9.3 mg/dL (ref 8.4–10.5)
CO2: 23 mEq/L (ref 19–32)
CREATININE: 0.87 mg/dL (ref 0.50–1.35)
Chloride: 100 mEq/L (ref 96–112)
GFR calc Af Amer: >90 mL/min (ref >90)
GLUCOSE: 102 mg/dL — AB (ref 70–99)
Potassium: 4.1 mEq/L (ref 3.7–5.3)
SODIUM: 140 meq/L (ref 137–147)

## 2013-11-02 LAB — CBC
HCT: 43.6 % (ref 39.0–52.0)
HEMOGLOBIN: 15.4 g/dL (ref 13.0–17.0)
MCH: 34.4 pg — AB (ref 26.0–34.0)
MCHC: 35.3 g/dL (ref 30.0–36.0)
MCV: 97.3 fL (ref 78.0–100.0)
PLATELETS: 165 10*3/uL (ref 150–400)
RBC: 4.48 MIL/uL (ref 4.22–5.81)
RDW: 11.8 % (ref 11.5–15.5)
WBC: 8.4 10*3/uL (ref 4.0–10.5)

## 2013-11-02 MED ORDER — METOPROLOL SUCCINATE ER 100 MG PO TB24: 100.0000 mg | ORAL_TABLET | Freq: Two times a day (BID) | ORAL | Status: DC

## 2013-11-02 NOTE — ED Notes (Signed)
Per pt, states high BP this am-was treated for the same on the 24th

## 2013-11-02 NOTE — ED Notes (Signed)
MD at bedside. 

## 2013-11-02 NOTE — Discharge Instructions (Signed)

## 2013-11-02 NOTE — ED Provider Notes (Signed)
And CSN: [id number]     Arrival date & time 11/02/13  1058 History   First MD Initiated Contact with Patient 11/02/13 1225     Chief Complaint  Patient presents with  . Hypertension     (Consider location/radiation/quality/duration/timing/severity/associated sxs/prior Treatment) Patient is a 48 y.o. male presenting with hypertension. The history is provided by the patient.  Hypertension This is a recurrent problem. Pertinent negatives include no chest pain, no abdominal pain, no headaches and no shortness of breath.   patient is a history of hypertension. He was seen in the ER 4 days ago for the same. He was started on amlodipine at that time. He states his blood pressures been up again. He was up to around 200/110 at home. States while it is up he feels strange tingling in his left arm and feels little strange. He states he feels much now. No numbness or weakness. No confusion. He has appointment to see cardiology tomorrow. No nausea vomiting. No vision changes  Past Medical History  Diagnosis Date  . Hypertension   . Hyperlipidemia   . Seasonal allergies    Past Surgical History  Procedure Laterality Date  . Back surgery     No family history on file. History  Substance Use Topics  . Smoking status: Former Games developer  . Smokeless tobacco: Not on file  . Alcohol Use: Yes    Review of Systems  Constitutional: Negative for activity change and appetite change.  Eyes: Negative for pain.  Respiratory: Negative for chest tightness and shortness of breath.   Cardiovascular: Negative for chest pain and leg swelling.  Gastrointestinal: Negative for nausea, vomiting, abdominal pain and diarrhea.  Genitourinary: Negative for flank pain.  Musculoskeletal: Negative for back pain and neck stiffness.  Skin: Negative for rash.  Neurological: Negative for weakness, numbness and headaches.  Psychiatric/Behavioral: Negative for behavioral problems.      Allergies  Review of patient's  allergies indicates no known allergies.  Home Medications   Prior to Admission medications   Medication Sig Start Date End Date Taking? Authorizing Provider  amLODipine (NORVASC) 5 MG tablet Take 1 tablet (5 mg total) by mouth daily. 10/29/13  Yes Jamesetta Orleans Lawyer, PA-C  aspirin EC 81 MG tablet Take 81 mg by mouth daily.   Yes Historical Provider, MD  atorvastatin (LIPITOR) 10 MG tablet Take 10 mg by mouth daily.   Yes Historical Provider, MD  fluticasone (FLONASE) 50 MCG/ACT nasal spray Place 2 sprays into both nostrils daily.   Yes Historical Provider, MD  loratadine (CLARITIN) 10 MG tablet Take 10 mg by mouth daily.   Yes Historical Provider, MD  losartan (COZAAR) 50 MG tablet Take 100 mg by mouth daily.   Yes Historical Provider, MD  metoprolol succinate (TOPROL-XL) 100 MG 24 hr tablet Take 1 tablet (100 mg total) by mouth 2 (two) times daily. Take with or immediately following a meal. 11/02/13   Juliet Rude. Charity Tessier, MD   BP 171/105  Pulse 78  Temp(Src) 98.3 F (36.8 C) (Oral)  Resp 17  SpO2 98% Physical Exam  Nursing note and vitals reviewed. Constitutional: He is oriented to person, place, and time. He appears well-developed and well-nourished.  Patient is somewhat obese  HENT:  Head: Normocephalic and atraumatic.  Neck: Normal range of motion. Neck supple.  Cardiovascular: Normal rate, regular rhythm and normal heart sounds.   No murmur heard. Pulmonary/Chest: Effort normal and breath sounds normal.  Abdominal: Soft. Bowel sounds are normal. He exhibits no distension  and no mass. There is no tenderness. There is no rebound and no guarding.  Musculoskeletal: Normal range of motion. He exhibits no edema.  Neurological: He is alert and oriented to person, place, and time. No cranial nerve deficit.  Skin: Skin is warm and dry.  Psychiatric: He has a normal mood and affect.    ED Course  Procedures (including critical care time) Labs Review Labs Reviewed  CBC - Abnormal;  Notable for the following:    MCH 34.4 (*)    All other components within normal limits  BASIC METABOLIC PANEL - Abnormal; Notable for the following:    Glucose, Bld 102 (*)    Anion gap 17 (*)    All other components within normal limits  I-STAT TROPOININ, ED    Imaging Review No results found.   EKG Interpretation   Date/Time:  Monday November 02 2013 11:34:14 EDT Ventricular Rate:  76 PR Interval:  124 QRS Duration: 104 QT Interval:  400 QTC Calculation: 450 R Axis:   -123 Text Interpretation:  Sinus rhythm Incomplete RBBB and LAFB Probable right  ventricular hypertrophy Probable lateral infarct, age indeterminate  Baseline wander in lead(s) V2 V3 V4 V6 Confirmed by Rubin Payor  MD, Harrold Donath  [phone]) on 11/02/2013 12:29:56 PM      MDM   Final diagnoses:  Essential hypertension   Hypertension. Recently seen for same and has follow up tomorrow. EKG reassuring. Patient's headache symptoms have improved. Discussed with Dr. [place] from cardiology. Patient has followup tomorrow with Dr.Varanasi. Will increase metoprolol to 100 mg twice a day. He'll take an extra dose this afternoon. Will discharge    Juliet Rude. Rubin Payor, MD 11/02/13 1531

## 2013-11-03 ENCOUNTER — Encounter: Payer: Self-pay | Admitting: Interventional Cardiology

## 2013-11-03 ENCOUNTER — Ambulatory Visit (INDEPENDENT_AMBULATORY_CARE_PROVIDER_SITE_OTHER): Payer: BC Managed Care – PPO | Admitting: Interventional Cardiology

## 2013-11-03 VITALS — BP 166/120 | HR 72 | Ht 75.0 in | Wt 238.4 lb

## 2013-11-03 DIAGNOSIS — I10 Essential (primary) hypertension: Secondary | ICD-10-CM

## 2013-11-03 DIAGNOSIS — E669 Obesity, unspecified: Secondary | ICD-10-CM

## 2013-11-03 DIAGNOSIS — R9431 Abnormal electrocardiogram [ECG] [EKG]: Secondary | ICD-10-CM

## 2013-11-03 MED ORDER — AMLODIPINE BESYLATE 5 MG PO TABS: 10.0000 mg | ORAL_TABLET | Freq: Every day | ORAL | Status: DC

## 2013-11-03 NOTE — Patient Instructions (Signed)
Your physician has recommended you make the following change in your medication:  1. Increase Amlodipine 5 mg to 2 tablets daily.   Your physician has requested that you regularly monitor and record your blood pressure readings at home. Please use the same machine at the same time of day to check your readings and record them. Call on 10/1 or 10/2 with BP readings.   Your physician recommends that you schedule a follow-up appointment in: 1 week with Dr. Eldridge DaceVaranasi or PA/NP.

## 2013-11-03 NOTE — Progress Notes (Signed)
Patient ID: Jesse Baker, male   DOB: Dec 01, 1965, 48 y.o.   MRN: [id number]     Patient ID: Jesse Baker MRN: [id number] DOB/AGE: 11/03/1965 48 y.o.   Referring Physician : Dr. Docia Chuck   Reason for Consultation Hypertensive urgency  HPI: 48 y/o who has had HTN for more than 10 years. He has had elevated BP readings in the past but this resolved with med changes.  In the past week, he has been to the ER for readings in toe 200 systolic.  He has blurry vision.  No CP, or SHOB.  No regular exercise.  Job is physical.    Diet is better.  Decreased salt intake as well.   In the ER, lasix was stopped and amlodipine started.  No improvement in BP since that time.  In 8/14, HCTZ was stopped due to increased Cr.  He was switched to Lasix then without kidney problems.   Toprol and Losartan recently increased.    No prior stress test or echo. He has had one low reading last week, but it has stayed high since that time.  He has a sister who is a pediatric oncology fellow in [place].   Current Outpatient Prescriptions  Medication Sig Dispense Refill  . amLODipine (NORVASC) 5 MG tablet Take 1 tablet (5 mg total) by mouth daily.  30 tablet  0  . aspirin EC 81 MG tablet Take 81 mg by mouth daily.      Marland Kitchen atorvastatin (LIPITOR) 10 MG tablet Take 10 mg by mouth daily.      . fluticasone (FLONASE) 50 MCG/ACT nasal spray Place 2 sprays into both nostrils daily.      Marland Kitchen loratadine (CLARITIN) 10 MG tablet Take 10 mg by mouth daily.      Marland Kitchen losartan (COZAAR) 50 MG tablet Take 100 mg by mouth daily.      . metoprolol succinate (TOPROL-XL) 100 MG 24 hr tablet Take 1 tablet (100 mg total) by mouth 2 (two) times daily. Take with or immediately following a meal.  10 tablet  0   No current facility-administered medications for this visit.   Past Medical History  Diagnosis Date  . Hypertension   . Hyperlipidemia   . Seasonal allergies     No family history on file.  History   Social History  .  Marital Status: Married    Spouse Name: N/A    Number of Children: N/A  . Years of Education: N/A   Occupational History  . Not on file.   Social History Main Topics  . Smoking status: Former Games developer  . Smokeless tobacco: Not on file  . Alcohol Use: Yes  . Drug Use: Not on file  . Sexual Activity: Not on file   Other Topics Concern  . Not on file   Social History Narrative  . No narrative on file    Past Surgical History  Procedure Laterality Date  . Back surgery        (Not in a hospital admission)  Review of systems complete and found to be negative unless listed above .  No nausea, vomiting.  No fever chills, Blurry vision when BP is high;No focal weakness,  No palpitations.  Physical Exam: Filed Vitals:   11/03/13 0927  BP: 166/120  Pulse: 72    Weight: 238 lb 6.4 oz (108.138 kg)  Physical exam:  [place]/AT EOMI No JVD, No carotid bruit RRR S1S2  No wheezing Soft. NT, nondistended No edema. No focal  motor or sensory deficits Normal affect  Labs:   Lab Results  Component Value Date   WBC 8.4 11/02/2013   HGB 15.4 11/02/2013   HCT 43.6 11/02/2013   MCV 97.3 11/02/2013   PLT 165 11/02/2013    Recent Labs Lab 10/29/13 1330 11/02/13 1136  NA 138 140  K 3.7 4.1  CL 96 100  CO2 24 23  BUN 24* 17  CREATININE 1.29 0.87  CALCIUM 9.4 9.3  PROT 7.8  --   BILITOT 3.3*  --   ALKPHOS 96  --   ALT 49  --   AST 46*  --   GLUCOSE 111* 102*   Lab Results  Component Value Date   CKTOTAL 251* 10/29/2013   TROPONINI <0.30 10/29/2013    No results found for this basename: CHOL   No results found for this basename: HDL   No results found for this basename: LDLCALC   No results found for this basename: TRIG   No results found for this basename: CHOLHDL   No results found for this basename: LDLDIRECT      Radiology: Head CT negative in the emergency room EKG: Normal sinus rhythm, question abnormal lead placement.  ASSESSMENT AND PLAN:   HTN:  We'll long  discussion regarding his blood pressure. First of all, he will work on lifestyle modifications. He needs to increase exercise to 5 days a week 30 minutes a day of walking. He eats out a fair bit. He needs to decrease salt intake. I also recommend weight loss. Even a modest weight loss may help lower blood pressure.  In terms of his medications, will increase amlodipine to 10 mg daily. If after a few days, there is not a significant decrease in his blood pressure, would add chlorthalidone 25 mg daily. Would have to follow his renal function closely to avoid hypokalemia or increased creatinine. In the past, on HCTZ, he did have increased creatinine, but tolerated Lasix well in the past.  If adding chlorthalidone does not get blood pressure down, would consider changing losartan to irbesartan 300 mg daily. If this is not successful, would then consider adding Spiriva lactone 25 mg daily. He would need electrolytes followed closely. Once blood pressure is better controlled, could consider decreasing Toprol. If additional blood pressure lowering is needed, to change Toprol to bystolic or even carvedilol.  Abnormal ECG: Nonspecific abnormality. Question whether lead placement was off. Not convinced that he has had a prior MI. No symptoms of heart failure. Signed:   Fredric MareJay S. Derell Bruun, MD, [facility] 11/03/2013, 9:47 AM

## 2013-11-10 ENCOUNTER — Ambulatory Visit (INDEPENDENT_AMBULATORY_CARE_PROVIDER_SITE_OTHER): Payer: BC Managed Care – PPO | Admitting: Interventional Cardiology

## 2013-11-10 ENCOUNTER — Encounter: Payer: Self-pay | Admitting: Interventional Cardiology

## 2013-11-10 VITALS — BP 140/100 | HR 65 | Ht 75.0 in | Wt 236.4 lb

## 2013-11-10 DIAGNOSIS — I10 Essential (primary) hypertension: Secondary | ICD-10-CM

## 2013-11-10 MED ORDER — LOSARTAN POTASSIUM 100 MG PO TABS: 100.0000 mg | ORAL_TABLET | Freq: Every day | ORAL | Status: DC

## 2013-11-10 MED ORDER — METOPROLOL SUCCINATE ER 100 MG PO TB24: 100.0000 mg | ORAL_TABLET | Freq: Every day | ORAL | Status: DC

## 2013-11-10 MED ORDER — AMLODIPINE BESYLATE 10 MG PO TABS
10.0000 mg | ORAL_TABLET | Freq: Every day | ORAL | Status: DC
Start: 2013-11-10 — End: 2014-11-08

## 2013-11-10 NOTE — Patient Instructions (Signed)
Your physician has recommended you make the following change in your medication:   1. Decrease Metoprolol Succinate 100 mg to 1 tablet daily.   Your physician has requested that you regularly monitor and record your blood pressure readings at home. Please use the same machine at the same time of day to check your readings and record them. Call if BP is consistently above 140/90.  Your physician wants you to follow-up in: 1 year with Dr. Eldridge DaceVaranasi. You will receive a reminder letter in the mail two months in advance. If you don't receive a letter, please call our office to schedule the follow-up appointment.

## 2013-11-10 NOTE — Progress Notes (Signed)
Patient ID: Jesse Baker, male   DOB: 07-15-1965, 48 y.o.   MRN: [id number] Patient ID: Jesse Baker, male   DOB: 15-Dec-1965, 48 y.o.   MRN: [id number]     Patient ID: Jesse Baker MRN: [id number] DOB/AGE: July 18, 1965 48 y.o.   Referring Physician : Dr. Docia Chuck   Reason for Consultation Hypertensive urgency  HPI: 48 y/o who has had HTN for more than 10 years. He has had elevated BP readings in the past but this resolved with med changes.  In the past week, he has been to the ER for readings in toe 200 systolic.  He has blurry vision.  No CP, or SHOB.  No regular exercise.  Job is physical.    Diet is better.  Decreased salt intake as well.   In the ER, lasix was stopped and amlodipine started.  No improvement in BP since that time.  In 8/14, HCTZ was stopped due to increased Cr.  He was switched to Lasix then without kidney problems.   Toprol and Losartan recently increased.    No prior stress test or echo. He has had one low reading last week, but it has stayed high since that time.  He has a sister who is a pediatric oncology fellow in [place].  Since the last visit a week ago, BPs are in the 115-140 systolc range.  Better is he rests and then checks.  Avg reading in the 120s.    Current Outpatient Prescriptions  Medication Sig Dispense Refill  . amLODipine (NORVASC) 10 MG tablet Take 1 tablet (10 mg total) by mouth daily.  30 tablet  11  . aspirin EC 81 MG tablet Take 81 mg by mouth daily.      Marland Kitchen atorvastatin (LIPITOR) 10 MG tablet Take 10 mg by mouth daily.      . fluticasone (FLONASE) 50 MCG/ACT nasal spray Place 2 sprays into both nostrils daily.      Marland Kitchen loratadine (CLARITIN) 10 MG tablet Take 10 mg by mouth daily.      Marland Kitchen losartan (COZAAR) 100 MG tablet Take 1 tablet (100 mg total) by mouth daily.  30 tablet  11  . metoprolol succinate (TOPROL-XL) 100 MG 24 hr tablet Take 1 tablet (100 mg total) by mouth daily. Take with or immediately following a meal.  10 tablet  0    No current facility-administered medications for this visit.   Past Medical History  Diagnosis Date  . Hypertension   . Hyperlipidemia   . Seasonal allergies     Family History  Problem Relation Age of Onset  . Hypertension Mother     History   Social History  . Marital Status: Married    Spouse Name: N/A    Number of Children: N/A  . Years of Education: N/A   Occupational History  . Not on file.   Social History Main Topics  . Smoking status: Former Games developer  . Smokeless tobacco: Not on file  . Alcohol Use: Yes  . Drug Use: Not on file  . Sexual Activity: Not on file   Other Topics Concern  . Not on file   Social History Narrative  . No narrative on file    Past Surgical History  Procedure Laterality Date  . Back surgery        (Not in a hospital admission)  Review of systems complete and found to be negative unless listed above .  No nausea, vomiting.  No fever chills, Blurry vision  when BP is high;No focal weakness,  No palpitations.  Physical Exam: Filed Vitals:   11/10/13 1100  BP: 140/100  Pulse: 65    Weight: 236 lb 6.4 oz (107.23 kg)  Physical exam:  [place]/AT EOMI No JVD, No carotid bruit RRR S1S2  No wheezing Soft. NT, nondistended No edema. No focal motor or sensory deficits Normal affect  Labs:   Lab Results  Component Value Date   WBC 8.4 11/02/2013   HGB 15.4 11/02/2013   HCT 43.6 11/02/2013   MCV 97.3 11/02/2013   PLT 165 11/02/2013   No results found for this basename: NA, K, CL, CO2, BUN, CREATININE, CALCIUM, LABALBU, PROT, BILITOT, ALKPHOS, ALT, AST, GLUCOSE,  in the last 168 hours Lab Results  Component Value Date   CKTOTAL 251* 10/29/2013   TROPONINI <0.30 10/29/2013    No results found for this basename: CHOL   No results found for this basename: HDL   No results found for this basename: LDLCALC   No results found for this basename: TRIG   No results found for this basename: CHOLHDL   No results found for this  basename: LDLDIRECT      Radiology: Head CT negative in the emergency room EKG: Normal sinus rhythm, question abnormal lead placement.  ASSESSMENT AND PLAN:   HTN:  We had a discussion regarding his blood pressure. First of all, he will work on lifestyle modifications. He needs to increase exercise to 5 days a week 30 minutes a day of walking. He eats out a fair bit. He needs to decrease salt intake. I also recommend weight loss. Even a modest weight loss may help lower blood pressure.  BP much better since increasing amlodipine to 10 mg daily.  Recheck today 134/86.   If BP increases in the future, would add chlorthalidone 25 mg daily. Would have to follow his renal function closely to avoid hypokalemia or increased creatinine. In the past, on HCTZ, he did have increased creatinine, but tolerated Lasix well in the past.  If adding chlorthalidone does not get blood pressure down, would consider changing losartan to irbesartan 300 mg daily. If this is not successful, would then consider adding Spirinolactone 25 mg daily. He would need electrolytes followed closely.   Since blood pressure is better controlled, decrease Toprol to 100 mg daily. If additional blood pressure lowering is needed, to change Toprol to bystolic or even carvedilol.  Abnormal ECG: Nonspecific abnormality. Question whether lead placement was off. Not convinced that he has had a prior MI. No symptoms of heart failure. Signed:   Fredric MareJay S. Lazar Tierce, MD, [facility] 11/10/2013, 1:23 PM

## 2014-05-05 ENCOUNTER — Other Ambulatory Visit: Payer: Self-pay | Admitting: *Deleted

## 2014-05-05 MED ORDER — METOPROLOL SUCCINATE ER 100 MG PO TB24: 100.0000 mg | ORAL_TABLET | Freq: Every day | ORAL | Status: DC

## 2014-09-23 ENCOUNTER — Encounter: Payer: Self-pay | Admitting: Interventional Cardiology

## 2014-10-20 ENCOUNTER — Other Ambulatory Visit: Payer: Self-pay | Admitting: Interventional Cardiology

## 2014-10-20 NOTE — Telephone Encounter (Signed)
Must have appt before any more refills can be prescribed.

## 2014-11-08 ENCOUNTER — Other Ambulatory Visit: Payer: Self-pay | Admitting: Interventional Cardiology

## 2014-11-11 ENCOUNTER — Other Ambulatory Visit: Payer: Self-pay | Admitting: Interventional Cardiology

## 2014-11-18 ENCOUNTER — Other Ambulatory Visit: Payer: Self-pay | Admitting: Interventional Cardiology

## 2014-11-29 ENCOUNTER — Ambulatory Visit (INDEPENDENT_AMBULATORY_CARE_PROVIDER_SITE_OTHER): Payer: BLUE CROSS/BLUE SHIELD | Admitting: Interventional Cardiology

## 2014-11-29 ENCOUNTER — Encounter: Payer: Self-pay | Admitting: Interventional Cardiology

## 2014-11-29 VITALS — BP 130/92 | HR 85 | Ht 75.0 in | Wt 240.0 lb

## 2014-11-29 DIAGNOSIS — I10 Essential (primary) hypertension: Secondary | ICD-10-CM | POA: Diagnosis not present

## 2014-11-29 DIAGNOSIS — E785 Hyperlipidemia, unspecified: Secondary | ICD-10-CM | POA: Diagnosis not present

## 2014-11-29 DIAGNOSIS — E669 Obesity, unspecified: Secondary | ICD-10-CM

## 2014-11-29 DIAGNOSIS — N529 Male erectile dysfunction, unspecified: Secondary | ICD-10-CM | POA: Diagnosis not present

## 2014-11-29 MED ORDER — METOPROLOL SUCCINATE ER 100 MG PO TB24: 100.0000 mg | ORAL_TABLET | Freq: Every day | ORAL | Status: DC

## 2014-11-29 MED ORDER — AMLODIPINE BESYLATE 10 MG PO TABS: 10.0000 mg | ORAL_TABLET | Freq: Every day | ORAL | Status: DC

## 2014-11-29 MED ORDER — LOSARTAN POTASSIUM 100 MG PO TABS: 100.0000 mg | ORAL_TABLET | Freq: Every day | ORAL | Status: DC

## 2014-11-29 MED ORDER — SILDENAFIL CITRATE 50 MG PO TABS: 50.0000 mg | ORAL_TABLET | Freq: Every day | ORAL | Status: DC | PRN

## 2014-11-29 NOTE — Progress Notes (Signed)
Patient ID: Jesse Baker, male   DOB: 02/15/1965, 49 y.o.   MRN: [id number]     Cardiology Office Note   Date:  11/29/2014   ID:  Jesse Baker, DOB 06/15/1965, MRN [id number]  PCP:  Darrow BussingKOIRALA,DIBAS, MD    No chief complaint on file. HTN   Wt Readings from Last 3 Encounters:  11/29/14 240 lb (108.863 kg)  11/10/13 236 lb 6.4 oz (107.23 kg)  11/03/13 238 lb 6.4 oz (108.138 kg)       History of Present Illness: Jesse Baker is a 49 y.o. male  who has had HTN for more than 10 years. He has had elevated BP readings in the past but this resolved with med changes.  He had very high blood pressure readings in 2015. Over the course of the past 12 months, his blood pressure readings have been controlled. They've typically been in the 130s systolic. He denies any chest discomfort or shortness of breath. He does not do any dedicated exercise. 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    Past Medical History  Diagnosis Date  . Hypertension   . Hyperlipidemia   . Seasonal allergies     Past Surgical History  Procedure Laterality Date  . Back surgery       Current Outpatient Prescriptions  Medication Sig Dispense Refill  . amLODipine (NORVASC) 10 MG tablet TAKE 1 TABLET (10 MG TOTAL) BY MOUTH DAILY. 30 tablet 0  . aspirin EC 81 MG tablet Take 81 mg by mouth daily.    Marland Kitchen. atorvastatin (LIPITOR) 10 MG tablet Take 10 mg by mouth daily.    . fluticasone (FLONASE) 50 MCG/ACT nasal spray Place 2 sprays into both nostrils daily.    Marland Kitchen. loratadine (CLARITIN) 10 MG tablet Take 10 mg by mouth daily.    Marland Kitchen. losartan (COZAAR) 100 MG tablet TAKE 1 TABLET (100 MG TOTAL) BY MOUTH DAILY. 30 tablet 0  . metoprolol succinate (TOPROL-XL) 100 MG 24 hr tablet TAKE 1 TABLET (100 MG TOTAL) BY MOUTH DAILY. TAKE WITH OR IMMEDIATELY FOLLOWING A MEAL. 30 tablet 6   No current facility-administered medications for this visit.    Allergies:   Review of patient's  allergies indicates no known allergies.    Social History:  The patient  reports that he has quit smoking. He does not have any smokeless tobacco history on file. He reports that he drinks alcohol.   Family History:  The patient's family history includes Heart attack in his paternal grandmother; Hypertension in his mother. There is no history of Stroke.    ROS:  Please see the history of present illness.   Otherwise, review of systems are positive for occasional erectile dysfunction.   All other systems are reviewed and negative.    PHYSICAL EXAM: VS:  BP 130/92 mmHg  Pulse 85  Ht 6\' 3"  (1.905 m)  Wt 240 lb (108.863 kg)  BMI 30.00 kg/m2 , BMI Body mass index is 30 kg/(m^2). GEN: Well nourished, well developed, in no acute distress HEENT: normal Neck: no JVD, carotid bruits, or masses Cardiac: RRR; no murmurs, rubs, or gallops,no edema  Respiratory:  clear to auscultation bilaterally, normal work of breathing GI: soft, nontender, nondistended, + BS MS: no deformity or atrophy Skin: warm and dry, no rash Neuro:  Strength and sensation are intact Psych: euthymic mood, full affect   EKG:   The ekg ordered today demonstrates normal sinus rhythm, mild widening of the  QRS complex, nonspecific ST segment changes   Recent Labs: No results found for requested labs within last 365 days.   Lipid Panel No results found for: CHOL, TRIG, HDL, CHOLHDL, VLDL, LDLCALC, LDLDIRECT   Other studies Reviewed: Additional studies/ records that were reviewed today with results demonstrating: prior ECG reviewed, I suspect the 2015 ECG done on September 28 shows arm lead reversal.  No significant change from today's ECG to the 10/29/2013 ECG   ASSESSMENT AND PLAN:  1. Hypertension: Well controlled. Continue current medicines. Refills given. 2. Hyperlipidemia: Continue atorvastatin.  Labs checked by his primary care physician. 3. Erectile dysfunction: Prescription given for Viagra 50 mg tablets. He  can start with a half tablet. If this is taken an hour before intercourse and not effective, then he can try a full tablet an hour before intercourse. 4. Obesity: He would benefit from weight loss. He states that he does not exercise because he is lazy.   Current medicines are reviewed at length with the patient today.  The patient concerns regarding his medicines were addressed.  The following changes have been made: prescription for Viagra given  Labs/ tests ordered today include:  No orders of the defined types were placed in this encounter.    Recommend 150 minutes/week of aerobic exercise Low fat, low carb, high fiber diet recommended  Disposition:   FU in when necessary, he can follow-up with Dr.Koirala.  After year, he would have to get his prescriptions filled from his primary care doctor's office. He will contact us if he has any acute issues.   Delorise Jackson., MD  11/29/2014 3:57 PM    [facility] [address] [place], [place], [place]  [address] Phone: [phone]; Fax: [phone]    .

## 2014-11-29 NOTE — Patient Instructions (Signed)
**Note De-Identified Michiko Lineman Obfuscation** Medication Instructions:  Viagra 50 mg daily as directed on bottle (Please refer to primary care MD for refills)- All other medications remain the same  Labwork: None  Testing/Procedures: None  Follow-Up: Your physician recommends that you schedule a follow-up appointment in: as needed.        If you need a refill on your cardiac medications before your next appointment, please call your pharmacy.

## 2014-12-11 ENCOUNTER — Other Ambulatory Visit: Payer: Self-pay | Admitting: Interventional Cardiology

## 2015-03-07 ENCOUNTER — Other Ambulatory Visit: Payer: Self-pay | Admitting: *Deleted

## 2015-03-07 MED ORDER — LOSARTAN POTASSIUM 100 MG PO TABS: 100.0000 mg | ORAL_TABLET | Freq: Every day | ORAL | Status: DC

## 2015-03-31 ENCOUNTER — Other Ambulatory Visit: Payer: Self-pay | Admitting: *Deleted

## 2015-03-31 MED ORDER — AMLODIPINE BESYLATE 10 MG PO TABS: 10.0000 mg | ORAL_TABLET | Freq: Every day | ORAL | Status: DC

## 2015-06-21 DIAGNOSIS — M25561 Pain in right knee: Secondary | ICD-10-CM | POA: Diagnosis not present

## 2016-01-10 ENCOUNTER — Other Ambulatory Visit: Payer: Self-pay | Admitting: Interventional Cardiology

## 2016-01-10 NOTE — Telephone Encounter (Signed)
Since patient is prn follow up, okay to deny and ask that the pharmacy forward the request to patients pcp? Please advise. Thanks, MI

## 2016-01-10 NOTE — Telephone Encounter (Signed)
Please refer to the pts PCP. Thanks. 

## 2016-01-20 ENCOUNTER — Other Ambulatory Visit: Payer: Self-pay | Admitting: Interventional Cardiology

## 2016-01-20 NOTE — Telephone Encounter (Signed)
Patient has not been seen since 11/2014, but is prn follow up with Dr Eldridge DaceVaranasi. Okay to refill or should this be deferred to patients pcp? Please advise. Thanks, MI

## 2016-01-20 NOTE — Telephone Encounter (Signed)
OK to refill for 90 days, tehn should go to PCP.

## 2016-01-23 ENCOUNTER — Other Ambulatory Visit: Payer: Self-pay | Admitting: Interventional Cardiology

## 2016-01-23 NOTE — Telephone Encounter (Signed)
**Note De-identified Milee Qualls Obfuscation** Please refer to PCP. Thanks. 

## 2016-04-17 ENCOUNTER — Encounter (INDEPENDENT_AMBULATORY_CARE_PROVIDER_SITE_OTHER): Payer: Self-pay

## 2016-04-17 ENCOUNTER — Ambulatory Visit (INDEPENDENT_AMBULATORY_CARE_PROVIDER_SITE_OTHER): Payer: BLUE CROSS/BLUE SHIELD | Admitting: Cardiology

## 2016-04-17 ENCOUNTER — Telehealth: Payer: Self-pay | Admitting: Interventional Cardiology

## 2016-04-17 ENCOUNTER — Encounter: Payer: Self-pay | Admitting: Cardiology

## 2016-04-17 VITALS — BP 130/104 | HR 75 | Ht 75.0 in | Wt 255.1 lb

## 2016-04-17 DIAGNOSIS — I10 Essential (primary) hypertension: Secondary | ICD-10-CM | POA: Diagnosis not present

## 2016-04-17 MED ORDER — ATORVASTATIN CALCIUM 10 MG PO TABS: 10.0000 mg | ORAL_TABLET | Freq: Every day | ORAL | 3 refills | Status: DC

## 2016-04-17 MED ORDER — HYDROCHLOROTHIAZIDE 12.5 MG PO CAPS: 12.5000 mg | ORAL_CAPSULE | Freq: Every day | ORAL | 3 refills | Status: DC

## 2016-04-17 NOTE — Telephone Encounter (Signed)
04/17/2016 11:05  Returned call to pt who reported new onset lightheaded/dizziness with elevated Bp as high 140/105 1 hr after taking AM meds and an extra dose of metoprolol.  Pt appt scheduled for 1500 04/17/16.  Jim Likeeri Suits [facility] RN CCM

## 2016-04-17 NOTE — Progress Notes (Signed)
Cardiology Office Note   Date:  04/17/2016   ID:  SILVIA MARKUSON, DOB November 24, 1965, MRN [id number]  PCP:  Leo Grosser, MD  Cardiologist:  Dr. Eldridge Dace    Chief Complaint  Patient presents with  . Hypertension    elevated BP and dizziness      History of Present Illness: Jesse Baker is a 51 y.o. male who presents for uncontrolled HTN.  Last seen by Dr. Eldridge Dace in 2016.  At that time BP was controlled, renal artery doppler was normal.  Today pt's BP was elevated and he had some lightheadedness and dizziness.  BP at home was 140/105 he did take an extra dose of metoprolol.  Now 130/104.    He denies any chest pain or SOB.  Today was the first day he has noticed elevated BP.  EKG in past with some LVH is more obvious.  We discussed echo but pt was not enthused at this time.  He is to see PCP in a week and half.  He will have lab work at that time.  He has been in usual state of health but not seen PCP in some time.  No notes in Epic.  He has gained some weight and he does not exercise except at work.   He does snore but no episodes of apnea.    Past Medical History:  Diagnosis Date  . Hyperlipidemia   . Hypertension   . Seasonal allergies     Past Surgical History:  Procedure Laterality Date  . BACK SURGERY       Current Outpatient Prescriptions  Medication Sig Dispense Refill  . amLODipine (NORVASC) 10 MG tablet Take 1 tablet (10 mg total) by mouth daily. *Per MD further refills need to be authorized by patients pcp* 90 tablet 0  . aspirin EC 81 MG tablet Take 81 mg by mouth daily.    Marland Kitchen atorvastatin (LIPITOR) 10 MG tablet Take 10 mg by mouth daily.    . fluticasone (FLONASE) 50 MCG/ACT nasal spray Place 2 sprays into both nostrils daily.    Marland Kitchen loratadine (CLARITIN) 10 MG tablet Take 10 mg by mouth daily.    Marland Kitchen losartan (COZAAR) 100 MG tablet Take 1 tablet (100 mg total) by mouth daily. 90 tablet 2  . metoprolol succinate (TOPROL-XL) 100 MG 24 hr tablet Take 1 tablet  (100 mg total) by mouth daily. Take with or immediately following a meal. 30 tablet 11  . sildenafil (VIAGRA) 50 MG tablet Take 1 tablet (50 mg total) by mouth daily as needed for erectile dysfunction. 10 tablet 2   No current facility-administered medications for this visit.     Allergies:   Patient has no known allergies.    Social History:  The patient  reports that he has quit smoking. He has never used smokeless tobacco. He reports that he drinks alcohol. He reports that he does not use drugs.   Family History:  The patient's family history includes Heart attack in his paternal grandmother; Hypertension in his mother.    ROS:  General:no colds or fevers,  weight gain since 2016.  Skin:no rashes or ulcers HEENT:no blurred vision, no congestion CV:see HPI PUL:see HPI GI:no diarrhea constipation or melena, no indigestion GU:no hematuria, no dysuria MS:no joint pain, no claudication Neuro:no syncope, + lightheadedness today Endo:no diabetes, no thyroid disease  Wt Readings from Last 3 Encounters:  04/17/16 255 lb 1.9 oz (115.7 kg)  11/29/14 240 lb (108.9 kg)  11/10/13 236 lb  6.4 oz (107.2 kg)     PHYSICAL EXAM: VS:  BP (!) 130/104   Pulse 75   Ht 6\' 3"  (1.905 m)   Wt 255 lb 1.9 oz (115.7 kg)   BMI 31.89 kg/m  , BMI Body mass index is 31.89 kg/m. General:Pleasant affect, NAD Skin:Warm and dry, brisk capillary refill HEENT:normocephalic, sclera clear, mucus membranes moist Neck:supple, no JVD, no bruits  Heart:S1S2 RRR without murmur, gallup, rub or click Lungs:clear without rales, rhonchi, or wheezes NFA:OZHYAbd:soft, non tender, + BS, do not palpate liver spleen or masses Ext:no lower ext edema, 2+ pedal pulses, 2+ radial pulses Neuro:alert and oriented X 3, MAE, follows commands, + facial symmetry    EKG:  EKG is ordered today. The ekg ordered today demonstrates SR LAD LVH    Recent Labs: No results found for requested labs within last 8760 hours.    Lipid  Panel No results found for: CHOL, TRIG, HDL, CHOLHDL, VLDL, LDLCALC, LDLDIRECT     Other studies Reviewed: Additional studies/ records that were reviewed today include:   Renal artery dopplers2014 .IMPRESSION: Normal renal ultrasound and normal renal artery duplex exam. No evidence for renal artery stenosis.   ASSESSMENT AND PLAN:  1.  Uncontrolled HTN will add HCTZ to medication he will have lab with PCP in about a week.  If not he will need to be seen again to check BMP- we discussed this medication may lower K+.   If BP remains elevated I would increase to 25 mg.  I do not know if this elevated BP has been ongoing or was just today.  I also recommended wt loss of 10 lbs and to exercise 20 min daily.  Walking would be great.   2. LVH, would recommend Echo to eval LV function.  Pt will think about this.  He will follow up with Dr. Eldridge DaceVaranasi in 6 months unless BP continues to be poorly controlled.    3.  HLD on lipitor he is out and we will refill lipitor until he can be seen by PCP.    Current medicines are reviewed with the patient today.  The patient Has no concerns regarding medicines.  The following changes have been made:  See above Labs/ tests ordered today include:see above  Disposition:   FU:  see above  Signed, Nada BoozerLaura Ingold, NP  04/17/2016 3:08 PM    [facility] [address], [place], [place]  [address]/ [address] [place], [place] Phone: [phone]([phone]; Fax: [phone]  [phone]

## 2016-04-17 NOTE — Patient Instructions (Signed)
Nada BoozerLaura Ingold, NP, has recommended making the following medication changes: 1. START Hydrochlorothiazide 12.5 mg - take 1 tablet by mouth daily  Your physician recommends that you schedule a follow-up appointment in 6 months with Dr Eldridge DaceVaranasi. You will receive a reminder letter in the mail two months in advance. If you don't receive a letter, please call our office to schedule the follow-up appointment.  If you need a refill on your cardiac medications before your next appointment, please call your pharmacy.

## 2016-04-17 NOTE — Telephone Encounter (Signed)
New message     Pt c/o BP issue: STAT if pt c/o blurred vision, one-sided weakness or slurred speech  1. What are your last 5 BP readings? This am  180/115 @ 7:30 am now 140/705   2. Are you having any other symptoms (ex. Dizziness, headache, blurred vision, passed out)? Dizziness   3. What is your BP issue? Wants to discuss with nurse.

## 2016-04-21 ENCOUNTER — Other Ambulatory Visit: Payer: Self-pay | Admitting: Interventional Cardiology

## 2016-04-25 DIAGNOSIS — J0140 Acute pansinusitis, unspecified: Secondary | ICD-10-CM | POA: Diagnosis not present

## 2016-04-25 DIAGNOSIS — H6122 Impacted cerumen, left ear: Secondary | ICD-10-CM | POA: Diagnosis not present

## 2016-04-25 DIAGNOSIS — J029 Acute pharyngitis, unspecified: Secondary | ICD-10-CM | POA: Diagnosis not present

## 2016-05-01 ENCOUNTER — Ambulatory Visit (INDEPENDENT_AMBULATORY_CARE_PROVIDER_SITE_OTHER): Payer: BLUE CROSS/BLUE SHIELD | Admitting: Family Medicine

## 2016-05-01 ENCOUNTER — Encounter: Payer: Self-pay | Admitting: Family Medicine

## 2016-05-01 VITALS — BP 138/100 | HR 84 | Temp 98.4°F | Resp 18 | Ht 75.0 in | Wt 257.0 lb

## 2016-05-01 DIAGNOSIS — Z7689 Persons encountering health services in other specified circumstances: Secondary | ICD-10-CM | POA: Diagnosis not present

## 2016-05-01 DIAGNOSIS — Z23 Encounter for immunization: Secondary | ICD-10-CM

## 2016-05-01 DIAGNOSIS — Z0000 Encounter for general adult medical examination without abnormal findings: Secondary | ICD-10-CM | POA: Diagnosis not present

## 2016-05-01 NOTE — Progress Notes (Signed)
Subjective:    Patient ID: Jesse Baker, male    DOB: 10/25/1965, 51 y.o.   MRN: [id number]  HPI Patient is here today to establish care. Blood pressure is elevated when I rechecked it 138/100. He recently started hydrochlorothiazide in the care of his cardiologist for elevated blood pressure. Today's value is no better. Past medical history significant for hypertension as well as hyperlipidemia. He is overdue for prostate cancer screening. He is also overdue for colonoscopy. He is also due for tetanus shot. He has no specific medical concerns today other than to establish care and get some fasting lab work Past Medical History:  Diagnosis Date  . Allergy   . Hyperlipidemia   . Hypertension   . Seasonal allergies    Past Surgical History:  Procedure Laterality Date  . BACK SURGERY     2012, lumbar discectomy    Current Outpatient Prescriptions on File Prior to Visit  Medication Sig Dispense Refill  . amLODipine (NORVASC) 10 MG tablet Take 1 tablet (10 mg total) by mouth daily. 90 tablet 3  . aspirin EC 81 MG tablet Take 81 mg by mouth daily.    Marland Kitchen. atorvastatin (LIPITOR) 10 MG tablet Take 1 tablet (10 mg total) by mouth daily. 90 tablet 3  . fluticasone (FLONASE) 50 MCG/ACT nasal spray Place 2 sprays into both nostrils daily.    . hydrochlorothiazide (MICROZIDE) 12.5 MG capsule Take 1 capsule (12.5 mg total) by mouth daily. 90 capsule 3  . loratadine (CLARITIN) 10 MG tablet Take 10 mg by mouth daily.    Marland Kitchen. losartan (COZAAR) 100 MG tablet Take 1 tablet (100 mg total) by mouth daily. 90 tablet 2  . metoprolol succinate (TOPROL-XL) 100 MG 24 hr tablet Take 1 tablet (100 mg total) by mouth daily. Take with or immediately following a meal. 30 tablet 11  . sildenafil (VIAGRA) 50 MG tablet Take 1 tablet (50 mg total) by mouth daily as needed for erectile dysfunction. 10 tablet 2   No current facility-administered medications on file prior to visit.    No Known Allergies Social History    Social History  . Marital status: Married    Spouse name: N/A  . Number of children: N/A  . Years of education: N/A   Occupational History  . Not on file.   Social History Main Topics  . Smoking status: Former Games developermoker  . Smokeless tobacco: Never Used  . Alcohol use Yes  . Drug use: No  . Sexual activity: Yes   Other Topics Concern  . Not on file   Social History Narrative  . No narrative on file   Family History  Problem Relation Age of Onset  . Hypertension Mother   . Heart attack Paternal Grandmother   . Cancer Paternal Grandfather     possible prostate cancer  . Stroke Neg Hx       Review of Systems  All other systems reviewed and are negative.      Objective:   Physical Exam  Constitutional: He is oriented to person, place, and time. He appears well-developed and well-nourished. No distress.  HENT:  Head: Normocephalic and atraumatic.  Right Ear: External ear normal.  Left Ear: External ear normal.  Nose: Nose normal.  Mouth/Throat: Oropharynx is clear and moist. No oropharyngeal exudate.  Eyes: Conjunctivae and EOM are normal. Pupils are equal, round, and reactive to light. Right eye exhibits no discharge. Left eye exhibits no discharge. No scleral icterus.  Neck: Normal range  of motion. Neck supple. No JVD present. No tracheal deviation present. No thyromegaly present.  Cardiovascular: Normal rate, regular rhythm, normal heart sounds and intact distal pulses.  Exam reveals no gallop and no friction rub.   No murmur heard. Pulmonary/Chest: Effort normal and breath sounds normal. No stridor. No respiratory distress. He has no wheezes. He has no rales. He exhibits no tenderness.  Abdominal: Soft. Bowel sounds are normal. He exhibits no distension and no mass. There is no tenderness. There is no rebound and no guarding.  Musculoskeletal: He exhibits no edema.  Lymphadenopathy:    He has no cervical adenopathy.  Neurological: He is alert and oriented to  person, place, and time. He has normal reflexes. He displays normal reflexes. No cranial nerve deficit. He exhibits normal muscle tone. Coordination normal.  Skin: Skin is warm. No rash noted. He is not diaphoretic. No erythema. No pallor.  Psychiatric: He has a normal mood and affect. His behavior is normal. Judgment and thought content normal.  Vitals reviewed.         Assessment & Plan:  Need for diphtheria-tetanus-pertussis (Tdap) vaccine - Plan: Tdap vaccine greater than or equal to 7yo IM  General medical exam - Plan: Ambulatory referral to Gastroenterology, CBC with Differential/Platelet, COMPLETE METABOLIC PANEL WITH GFR, Lipid panel, PSA  Establishing care with new doctor, encounter for  The pressure today is elevated. I've asked the patient checks blood pressure everyday for one week and then bring the values to me for review. Titrate medication to achieve a blood pressure less than 140/90. He received his tetanus shot today. I will schedule the patient for screening colonoscopy. I've asked him to return fasting for a CBC, CMP, fasting lipid panel, and a PSA.

## 2016-05-31 ENCOUNTER — Other Ambulatory Visit: Payer: Self-pay | Admitting: Interventional Cardiology

## 2016-06-04 ENCOUNTER — Encounter: Payer: Self-pay | Admitting: Family Medicine

## 2016-09-13 ENCOUNTER — Other Ambulatory Visit: Payer: Self-pay | Admitting: Cardiology

## 2016-12-14 ENCOUNTER — Encounter: Payer: Self-pay | Admitting: Interventional Cardiology

## 2016-12-18 ENCOUNTER — Other Ambulatory Visit: Payer: Self-pay | Admitting: Interventional Cardiology

## 2016-12-22 NOTE — Progress Notes (Signed)
Cardiology Office Note   Date:  12/25/2016   ID:  Jesse MoellerGary M Baker, DOB 08/09/1965, MRN [id number]  PCP:  Donita BrooksPickard, Warren T, MD    No chief complaint on file. Hypertension   Wt Readings from Last 3 Encounters:  12/25/16 258 lb (117 kg)  05/01/16 257 lb (116.6 kg)  04/17/16 255 lb 1.9 oz (115.7 kg)       History of Present Illness: Jesse Baker is a 51 y.o. male  who has had HTN for more than 10 years.    Since last year, he has gained some weight.  He has not been exercising regularly.  He feels like he does try to eat healthy, but unfortunately has gained weight.  His job remains physical.  Denies : Chest pain. Dizziness. Leg edema. Nitroglycerin use. Orthopnea. Palpitations. Paroxysmal nocturnal dyspnea. Shortness of breath. Syncope.     Past Medical History:  Diagnosis Date  . Allergy   . Hyperlipidemia   . Hypertension   . Seasonal allergies     Past Surgical History:  Procedure Laterality Date  . BACK SURGERY     2012, lumbar discectomy      Current Outpatient Medications  Medication Sig Dispense Refill  . amLODipine (NORVASC) 10 MG tablet Take 1 tablet (10 mg total) by mouth daily. 90 tablet 3  . aspirin EC 81 MG tablet Take 81 mg by mouth daily.    Marland Kitchen. atorvastatin (LIPITOR) 10 MG tablet Take 1 tablet (10 mg total) by mouth daily. 90 tablet 3  . fluticasone (FLONASE) 50 MCG/ACT nasal spray Place 2 sprays into both nostrils daily.    Marland Kitchen. loratadine (CLARITIN) 10 MG tablet Take 10 mg by mouth daily.    Marland Kitchen. losartan (COZAAR) 100 MG tablet TAKE 1 TABLET (100 MG TOTAL) BY MOUTH DAILY. 90 tablet 2  . metoprolol succinate (TOPROL-XL) 100 MG 24 hr tablet TAKE 1 TABLET EVERY DAY 30 tablet 4  . sildenafil (VIAGRA) 50 MG tablet Take 1 tablet (50 mg total) by mouth daily as needed for erectile dysfunction. 10 tablet 2  . hydrochlorothiazide (MICROZIDE) 12.5 MG capsule Take 1 capsule (12.5 mg total) by mouth daily. 90 capsule 3   No current facility-administered  medications for this visit.     Allergies:   Patient has no known allergies.    Social History:  The patient  reports that he has quit smoking. he has never used smokeless tobacco. He reports that he drinks alcohol. He reports that he does not use drugs.   Family History:  The patient's family history includes Cancer in his paternal grandfather; Heart attack in his paternal grandmother; Hypertension in his mother.    ROS:  Please see the history of present illness.   Otherwise, review of systems are positive for weight gain.   All other systems are reviewed and negative.    PHYSICAL EXAM: VS:  BP 130/82   Pulse 77   Ht 6\' 3"  (1.905 m)   Wt 258 lb (117 kg)   SpO2 97%   BMI 32.25 kg/m  , BMI Body mass index is 32.25 kg/m. GEN: Well nourished, well developed, in no acute distress  HEENT: normal  Neck: no JVD, carotid bruits, or masses Cardiac: RRR; no murmurs, rubs, or gallops,no edema  Respiratory:  clear to auscultation bilaterally, normal work of breathing GI: soft, nontender, nondistended, + BS MS: no deformity or atrophy  Skin: warm and dry, no rash Neuro:  Strength and sensation are intact Psych:  euthymic mood, full affect   EKG:   The ekg ordered on 3/18 demonstrates NSR, LVH   Recent Labs: No results found for requested labs within last 8760 hours.   Lipid Panel No results found for: CHOL, TRIG, HDL, CHOLHDL, VLDL, LDLCALC, LDLDIRECT   Other studies Reviewed: Additional studies/ records that were reviewed today with results demonstrating: 2016 labs reviewed.   ASSESSMENT AND PLAN:  1. HTN: Controlled.  Continue current medications. 2. Hyperlipidemia: Lipids controlled at last check.  He states he did have more recent blood work but I do not have those readings. 3. Erectile dysfunction: Has used sildenafil. 4. Obesity: We spoke about the importance of weight loss.  Trying to avoid added sugar in the diet and increasing duration of physical activity will  help.   Current medicines are reviewed at length with the patient today.  The patient concerns regarding his medicines were addressed.  The following changes have been made:  No change  Labs/ tests ordered today include:  No orders of the defined types were placed in this encounter.   Recommend 150 minutes/week of aerobic exercise Low fat, low carb, high fiber diet recommended  Disposition:   FU in 1 year   Signed, Lance MussJayadeep Gabreil [place], MD  12/25/2016 10:20 AM    [facility] [address], [place], [place]  [address] Phone: [phone]([phone]; Fax: [phone]

## 2016-12-25 ENCOUNTER — Encounter (INDEPENDENT_AMBULATORY_CARE_PROVIDER_SITE_OTHER): Payer: Self-pay

## 2016-12-25 ENCOUNTER — Encounter: Payer: Self-pay | Admitting: Interventional Cardiology

## 2016-12-25 ENCOUNTER — Ambulatory Visit: Payer: BLUE CROSS/BLUE SHIELD | Admitting: Interventional Cardiology

## 2016-12-25 VITALS — BP 130/82 | HR 77 | Ht 75.0 in | Wt 258.0 lb

## 2016-12-25 DIAGNOSIS — I10 Essential (primary) hypertension: Secondary | ICD-10-CM

## 2016-12-25 DIAGNOSIS — E669 Obesity, unspecified: Secondary | ICD-10-CM | POA: Diagnosis not present

## 2016-12-25 DIAGNOSIS — Z6832 Body mass index (BMI) 32.0-32.9, adult: Secondary | ICD-10-CM

## 2016-12-25 DIAGNOSIS — E782 Mixed hyperlipidemia: Secondary | ICD-10-CM

## 2016-12-25 MED ORDER — ATORVASTATIN CALCIUM 10 MG PO TABS: 10.0000 mg | ORAL_TABLET | Freq: Every day | ORAL | 3 refills | Status: DC

## 2016-12-25 MED ORDER — AMLODIPINE BESYLATE 10 MG PO TABS: 10.0000 mg | ORAL_TABLET | Freq: Every day | ORAL | 3 refills | Status: DC

## 2016-12-25 MED ORDER — HYDROCHLOROTHIAZIDE 12.5 MG PO CAPS: 12.5000 mg | ORAL_CAPSULE | Freq: Every day | ORAL | 3 refills | Status: DC

## 2016-12-25 MED ORDER — METOPROLOL SUCCINATE ER 100 MG PO TB24: 100.0000 mg | ORAL_TABLET | Freq: Every day | ORAL | 11 refills | Status: DC

## 2016-12-25 MED ORDER — LOSARTAN POTASSIUM 100 MG PO TABS: 100.0000 mg | ORAL_TABLET | Freq: Every day | ORAL | 3 refills | Status: DC

## 2016-12-25 NOTE — Patient Instructions (Signed)

## 2017-12-28 ENCOUNTER — Other Ambulatory Visit: Payer: Self-pay | Admitting: Interventional Cardiology

## 2018-01-06 ENCOUNTER — Other Ambulatory Visit: Payer: Self-pay | Admitting: Interventional Cardiology

## 2018-01-09 ENCOUNTER — Other Ambulatory Visit: Payer: Self-pay | Admitting: Interventional Cardiology

## 2018-01-10 NOTE — Telephone Encounter (Signed)
Outpatient Medication Detail    Disp Refills Start End   hydrochlorothiazide (MICROZIDE) 12.5 MG capsule 30 capsule 0 01/06/2018    Sig - Route: Take 1 capsule (12.5 mg total) by mouth daily. Patient needs to call and schedule an appointment for further refills 1st attempt - Oral   Sent to pharmacy as: hydrochlorothiazide (MICROZIDE) 12.5 MG capsule   E-Prescribing Status: Receipt confirmed by pharmacy (01/06/2018 11:05 AM EST)   Pharmacy   [id number] - [place], [place] - [address]

## 2018-01-13 ENCOUNTER — Other Ambulatory Visit: Payer: Self-pay | Admitting: Interventional Cardiology

## 2018-01-18 ENCOUNTER — Other Ambulatory Visit: Payer: Self-pay | Admitting: Interventional Cardiology

## 2018-01-31 ENCOUNTER — Other Ambulatory Visit: Payer: Self-pay | Admitting: Interventional Cardiology

## 2018-02-17 ENCOUNTER — Other Ambulatory Visit: Payer: Self-pay | Admitting: Interventional Cardiology

## 2018-02-22 ENCOUNTER — Other Ambulatory Visit: Payer: Self-pay | Admitting: Interventional Cardiology

## 2018-03-15 ENCOUNTER — Other Ambulatory Visit: Payer: Self-pay | Admitting: Interventional Cardiology

## 2018-04-05 ENCOUNTER — Other Ambulatory Visit: Payer: Self-pay | Admitting: Interventional Cardiology

## 2018-04-14 ENCOUNTER — Other Ambulatory Visit: Payer: Self-pay | Admitting: Interventional Cardiology

## 2018-04-22 ENCOUNTER — Other Ambulatory Visit: Payer: Self-pay | Admitting: Interventional Cardiology

## 2018-04-22 MED ORDER — METOPROLOL SUCCINATE ER 100 MG PO TB24: 100.0000 mg | ORAL_TABLET | Freq: Every day | ORAL | 0 refills | Status: DC

## 2018-04-22 NOTE — Telephone Encounter (Signed)
° ° ° °*  STAT* If patient is at the pharmacy, call can be transferred to refill team.   1. Which medications need to be refilled? (please list name of each medication and dose if known) metoprolol succinate (TOPROL-XL) 100 MG 24 hr tablet,   2. Which pharmacy/location (including street and city if local pharmacy) is medication to be sent to? [id number] - [place], [place] - [address] [facility] [address]  3. Do they need a 30 day or 90 day supply? 90

## 2018-05-05 ENCOUNTER — Other Ambulatory Visit: Payer: Self-pay | Admitting: Interventional Cardiology

## 2018-05-14 ENCOUNTER — Other Ambulatory Visit: Payer: Self-pay | Admitting: Interventional Cardiology

## 2018-05-14 MED ORDER — ATORVASTATIN CALCIUM 10 MG PO TABS: 10.0000 mg | ORAL_TABLET | Freq: Every day | ORAL | 0 refills | Status: DC

## 2018-05-14 NOTE — Telephone Encounter (Signed)
Pt's medication was sent to pt's pharmacy as requested. Confirmation received.  °

## 2018-06-03 ENCOUNTER — Telehealth: Payer: Self-pay

## 2018-06-03 NOTE — Telephone Encounter (Signed)
Virtual Visit Pre-Appointment Phone Call  "(Name), I am calling you today to discuss your upcoming appointment. We are currently trying to limit exposure to the virus that causes COVID-19 by seeing patients at home rather than in the office."  1. "What is the BEST phone number to call the day of the visit?" - include this in appointment notes  2. "Do you have or have access to (through a family member/friend) a smartphone with video capability that we can use for your visit?" a. If yes - list this number in appt notes as "cell" (if different from BEST phone #) and list the appointment type as a VIDEO visit in appointment notes b. If no - list the appointment type as a PHONE visit in appointment notes  3. Confirm consent - "In the setting of the current Covid19 crisis, you are scheduled for a (phone or video) visit with your provider on (date) at (time).  Just as we do with many in-office visits, in order for you to participate in this visit, we must obtain consent.  If you'd like, I can send this to your mychart (if signed up) or email for you to review.  Otherwise, I can obtain your verbal consent now.  All virtual visits are billed to your insurance company just like a normal visit would be.  By agreeing to a virtual visit, we'd like you to understand that the technology does not allow for your provider to perform an examination, and thus may limit your provider's ability to fully assess your condition. If your provider identifies any concerns that need to be evaluated in person, we will make arrangements to do so.  Finally, though the technology is pretty good, we cannot assure that it will always work on either your or our end, and in the setting of a video visit, we may have to convert it to a phone-only visit.  In either situation, we cannot ensure that we have a secure connection.  Are you willing to proceed?" STAFF: Did the patient verbally acknowledge consent to telehealth visit? yes  4.  Advise patient to be prepared - "Two hours prior to your appointment, go ahead and check your blood pressure, pulse, oxygen saturation, and your weight (if you have the equipment to check those) and write them all down. When your visit starts, your provider will ask you for this information. If you have an Apple Watch or Kardia device, please plan to have heart rate information ready on the day of your appointment. Please have a pen and paper handy nearby the day of the visit as well."  5. Give patient instructions for MyChart download to smartphone OR Doximity/Doxy.me as below if video visit (depending on what platform provider is using)  6. Inform patient they will receive a phone call 15 minutes prior to their appointment time (may be from unknown caller ID) so they should be prepared to answer    TELEPHONE CALL NOTE  JAKSYN FESER has been deemed a candidate for a follow-up tele-health visit to limit community exposure during the Covid-19 pandemic. I spoke with the patient via phone to ensure availability of phone/video source, confirm preferred email & phone number, and discuss instructions and expectations.  I reminded Jesse Baker to be prepared with any vital sign and/or heart rhythm information that could potentially be obtained via home monitoring, at the time of his visit. I reminded Jesse Baker to expect a phone call prior to his visit.  Clide DalesDanielle M Reid Nawrot, CMA 06/03/2018 2:35 PM   INSTRUCTIONS FOR DOWNLOADING THE MYCHART APP TO SMARTPHONE  - The patient must first make sure to have activated MyChart and know their login information - If Apple, go to [employer] Store and type in MyChart in the search bar and download the app. If Android, ask patient to go to [employer] Play Store and type in [place] in the search bar and download the app. The app is free but as with any other app downloads, their phone may require them to verify saved payment information or Apple/Android password.  - The  patient will need to then log into the app with their MyChart username and password, and select [facility] as their healthcare provider to link the account. When it is time for your visit, go to the MyChart app, find appointments, and click Begin Video Visit. Be sure to Select Allow for your device to access the Microphone and Camera for your visit. You will then be connected, and your provider will be with you shortly.  **If they have any issues connecting, or need assistance please contact MyChart service desk [phone])**  **If using a computer, in order to ensure the best quality for their visit they will need to use either of the following Internet Browsers: [employer] Edge, or Google Chrome**  IF USING DOXIMITY or DOXY.ME - The patient will receive a link just prior to their visit by text.     FULL LENGTH CONSENT FOR TELE-HEALTH VISIT   I hereby voluntarily request, consent and authorize [facility] and its employed or contracted physicians, physician assistants, nurse practitioners or other licensed health care professionals (the Practitioner), to provide me with telemedicine health care services (the "Services") as deemed necessary by the treating Practitioner. I acknowledge and consent to receive the Services by the Practitioner via telemedicine. I understand that the telemedicine visit will involve communicating with the Practitioner through live audiovisual communication technology and the disclosure of certain medical information by electronic transmission. I acknowledge that I have been given the opportunity to request an in-person assessment or other available alternative prior to the telemedicine visit and am voluntarily participating in the telemedicine visit.  I understand that I have the right to withhold or withdraw my consent to the use of telemedicine in the course of my care at any time, without affecting my right to future care or treatment, and that the  Practitioner or I may terminate the telemedicine visit at any time. I understand that I have the right to inspect all information obtained and/or recorded in the course of the telemedicine visit and may receive copies of available information for a reasonable fee.  I understand that some of the potential risks of receiving the Services via telemedicine include:  Marland Kitchen. Delay or interruption in medical evaluation due to technological equipment failure or disruption; . Information transmitted may not be sufficient (e.g. poor resolution of images) to allow for appropriate medical decision making by the Practitioner; and/or  . In rare instances, security protocols could fail, causing a breach of personal health information.  Furthermore, I acknowledge that it is my responsibility to provide information about my medical history, conditions and care that is complete and accurate to the best of my ability. I acknowledge that Practitioner's advice, recommendations, and/or decision may be based on factors not within their control, such as incomplete or inaccurate data provided by me or distortions of diagnostic images or specimens that may result from electronic transmissions. I understand that  the practice of medicine is not an exact science and that Practitioner makes no warranties or guarantees regarding treatment outcomes. I acknowledge that I will receive a copy of this consent concurrently upon execution via email to the email address I last provided but may also request a printed copy by calling the office of [place].    I understand that my insurance will be billed for this visit.   I have read or had this consent read to me. . I understand the contents of this consent, which adequately explains the benefits and risks of the Services being provided via telemedicine.  . I have been provided ample opportunity to ask questions regarding this consent and the Services and have had my questions answered to my  satisfaction. . I give my informed consent for the services to be provided through the use of telemedicine in my medical care  By participating in this telemedicine visit I agree to the above.

## 2018-06-04 NOTE — Progress Notes (Signed)
Virtual Visit via Video Note   This visit type was conducted due to national recommendations for restrictions regarding the COVID-19 Pandemic (e.g. social distancing) in an effort to limit this patient's exposure and mitigate transmission in our community.  Due to his co-morbid illnesses, this patient is at least at moderate risk for complications without adequate follow up.  This format is felt to be most appropriate for this patient at this time.  All issues noted in this document were discussed and addressed.  A limited physical exam was performed with this format.  Please refer to the patient's chart for his consent to telehealth for [facility].    Evaluation Performed:  Follow-up visit  Date:  06/05/2018   ID:  Jesse Baker, DOB 12/16/1965, MRN [id number]  Patient Location: Home Provider Location: Home  PCP:  Donita BrooksPickard, Warren T, MD  Cardiologist:  No primary care provider on file. Cainan Trull Electrophysiologist:  None   Chief Complaint:  HTN  History of Present Illness:    Jesse Baker is a 53 y.o. male who has had HTN for more than 10 years.    He has had issues with maintaining a healthy diet and regular exercise.   BP in the morning tends to be higher, but ok, in the 140/80 range.  BP in the afternoons are in the 120/80 range.  Work is most strenuous exercise.  Not doing any other exercise.  Works in [employer].  Diet has been ok.  Since shelter in place, he is getting fast food for lunch.  Denies : Chest pain. Dizziness. Leg edema. Nitroglycerin use. Orthopnea. Palpitations. Paroxysmal nocturnal dyspnea. Shortness of breath. Syncope.   The patient does not have symptoms concerning for COVID-19 infection (fever, chills, cough, or new shortness of breath).    Past Medical History:  Diagnosis Date  . Allergy   . Hyperlipidemia   . Hypertension   . Seasonal allergies    Past Surgical History:  Procedure Laterality Date  . BACK SURGERY     2012, lumbar discectomy       Current Meds  Medication Sig  . amLODipine (NORVASC) 10 MG tablet TAKE 1 TABLET BY MOUTH EVERY DAY  . aspirin EC 81 MG tablet Take 81 mg by mouth daily.  Marland Kitchen. atorvastatin (LIPITOR) 10 MG tablet Take 1 tablet (10 mg total) by mouth daily. Please keep upcoming appt with Dr. Eldridge DaceVaranasi in April for future refills. Thank you  . fluticasone (FLONASE) 50 MCG/ACT nasal spray Place 2 sprays into both nostrils daily.  Marland Kitchen. loratadine (CLARITIN) 10 MG tablet Take 10 mg by mouth daily.  Marland Kitchen. losartan (COZAAR) 100 MG tablet Take 1 tablet (100 mg total) by mouth daily. Please make appt for future refills 2nd attempt.  . metoprolol succinate (TOPROL-XL) 100 MG 24 hr tablet Take 1 tablet (100 mg total) by mouth daily. Please keep upcoming appt for future refills. Thank you  . sildenafil (VIAGRA) 50 MG tablet Take 1 tablet (50 mg total) by mouth daily as needed for erectile dysfunction.     Allergies:   Patient has no known allergies.   Social History   Tobacco Use  . Smoking status: Former Games developermoker  . Smokeless tobacco: Never Used  Substance Use Topics  . Alcohol use: Yes  . Drug use: No     Family Hx: The patient's family history includes Cancer in his paternal grandfather; Heart attack in his paternal grandmother; Hypertension in his mother. There is no history of Stroke.  ROS:  Please see the history of present illness.    Unable to lose weight. All other systems reviewed and are negative.   Prior CV studies:   The following studies were reviewed today:    Labs/Other Tests and Data Reviewed:    EKG:  An ECG dated 3/18 was personally reviewed today and demonstrated:  NSR, LAFB  Recent Labs: No results found for requested labs within last 8760 hours.   Recent Lipid Panel No results found for: CHOL, TRIG, HDL, CHOLHDL, LDLCALC, LDLDIRECT  Wt Readings from Last 3 Encounters:  06/05/18 255 lb (115.7 kg)  12/25/16 258 lb (117 kg)  05/01/16 257 lb (116.6 kg)     Objective:    Vital  Signs:  BP 129/85   Pulse 93   Ht 6\' 3"  (1.905 m)   Wt 255 lb (115.7 kg)   BMI 31.87 kg/m    VITAL SIGNS:  reviewed GEN:  no acute distress RESPIRATORY:  normal respiratory effort, symmetric expansion PSYCH:  normal affect exam limited by video format  ASSESSMENT & PLAN:    1. HTN: Move metoprolol to evening to help lower morning readings.  COntinue other meds. 2. Hyperlipidemia:  Needs repeat labs when virus slows down.  Plan to check labs in a fe wmonths 3. Erectile dysfunction: Has used sildenafil. Will refill 4. Obesity: Spoke about increased exercise and avoiding fast food.   COVID-19 Education: The signs and symptoms of COVID-19 were discussed with the patient and how to seek care for testing (follow up with PCP or arrange E-visit).  The importance of social distancing was discussed today.  Time:   Today, I have spent 25 minutes with the patient with telehealth technology discussing the above problems.     Medication Adjustments/Labs and Tests Ordered: Current medicines are reviewed at length with the patient today.  Concerns regarding medicines are outlined above.   Tests Ordered: No orders of the defined types were placed in this encounter.   Medication Changes: No orders of the defined types were placed in this encounter.   Disposition:  Follow up in 1 year(s)  Signed, Lance Muss, MD  06/05/2018 8:50 AM    [facility]

## 2018-06-05 ENCOUNTER — Encounter: Payer: Self-pay | Admitting: Interventional Cardiology

## 2018-06-05 ENCOUNTER — Telehealth (INDEPENDENT_AMBULATORY_CARE_PROVIDER_SITE_OTHER): Payer: BLUE CROSS/BLUE SHIELD | Admitting: Interventional Cardiology

## 2018-06-05 ENCOUNTER — Other Ambulatory Visit: Payer: Self-pay

## 2018-06-05 VITALS — BP 129/85 | HR 93 | Ht 75.0 in | Wt 255.0 lb

## 2018-06-05 DIAGNOSIS — E669 Obesity, unspecified: Secondary | ICD-10-CM

## 2018-06-05 DIAGNOSIS — E782 Mixed hyperlipidemia: Secondary | ICD-10-CM | POA: Diagnosis not present

## 2018-06-05 DIAGNOSIS — I10 Essential (primary) hypertension: Secondary | ICD-10-CM | POA: Diagnosis not present

## 2018-06-05 MED ORDER — AMLODIPINE BESYLATE 10 MG PO TABS: 10.0000 mg | ORAL_TABLET | Freq: Every day | ORAL | 3 refills | Status: DC

## 2018-06-05 MED ORDER — ATORVASTATIN CALCIUM 10 MG PO TABS: 10.0000 mg | ORAL_TABLET | Freq: Every day | ORAL | 3 refills | Status: DC

## 2018-06-05 MED ORDER — LOSARTAN POTASSIUM 100 MG PO TABS: 100.0000 mg | ORAL_TABLET | Freq: Every day | ORAL | 3 refills | Status: DC

## 2018-06-05 MED ORDER — SILDENAFIL CITRATE 20 MG PO TABS: ORAL_TABLET | ORAL | 11 refills | Status: DC

## 2018-06-05 MED ORDER — METOPROLOL SUCCINATE ER 100 MG PO TB24: 100.0000 mg | ORAL_TABLET | Freq: Every day | ORAL | 3 refills | Status: DC

## 2018-06-05 NOTE — Patient Instructions (Addendum)
Medication Instructions:  Your physician recommends that you continue on your current medications as directed. Please refer to the Current Medication list given to you today.  Take your metoprolol at night  If you need a refill on your cardiac medications before your next appointment, please call your pharmacy.   Lab work: Your physician recommends that you return for a FASTING lipid profile, complete metabolic panel, and complete blood count on 08/05/18  If you have labs (blood work) drawn today and your tests are completely normal, you will receive your results only by: Marland Kitchen MyChart Message (if you have MyChart) OR . A paper copy in the mail If you have any lab test that is abnormal or we need to change your treatment, we will call you to review the results.  Testing/Procedures: None ordered  Follow-Up: At [facility], you and your health needs are our priority.  As part of our continuing mission to provide you with exceptional heart care, we have created designated Provider Care Teams.  These Care Teams include your primary Cardiologist (physician) and Advanced Practice Providers (APPs -  Physician Assistants and Nurse Practitioners) who all work together to provide you with the care you need, when you need it. . You will need a follow up appointment in 1 year.  Please call our office 2 months in advance to schedule this appointment.  You may see Everette Rank, MD or one of the following Advanced Practice Providers on your designated Care Team:   . Robbie Lis, PA-C . Dayna Dunn, PA-C . Jacolyn Reedy, PA-C  Any Other Special Instructions Will Be Listed Below (If Applicable).  Your provider recommends that you maintain 150 minutes per week of moderate aerobic activity.   Heart-Healthy Eating Plan Heart-healthy meal planning includes:  Eating less unhealthy fats.  Eating more healthy fats.  Making other changes in your diet. Talk with your doctor or a diet specialist  (dietitian) to create an eating plan that is right for you. What is my plan? Your doctor may recommend an eating plan that includes:  Total fat: ______% or less of total calories a day.  Saturated fat: ______% or less of total calories a day.  Cholesterol: less than _________mg a day. What are tips for following this plan? Cooking Avoid frying your food. Try to bake, boil, grill, or broil it instead. You can also reduce fat by:  Removing the skin from poultry.  Removing all visible fats from meats.  Steaming vegetables in water or broth. Meal planning   At meals, divide your plate into four equal parts: ? Fill one-half of your plate with vegetables and green salads. ? Fill one-fourth of your plate with whole grains. ? Fill one-fourth of your plate with lean protein foods.  Eat 4-5 servings of vegetables per day. A serving of vegetables is: ? 1 cup of raw or cooked vegetables. ? 2 cups of raw leafy greens.  Eat 4-5 servings of fruit per day. A serving of fruit is: ? 1 medium whole fruit. ?  cup of dried fruit. ?  cup of fresh, frozen, or canned fruit. ?  cup of 100% fruit juice.  Eat more foods that have soluble fiber. These are apples, broccoli, carrots, beans, peas, and barley. Try to get 20-30 g of fiber per day.  Eat 4-5 servings of nuts, legumes, and seeds per week: ? 1 serving of dried beans or legumes equals  cup after being cooked. ? 1 serving of nuts is  cup. ?  1 serving of seeds equals 1 tablespoon. General information  Eat more home-cooked food. Eat less restaurant, buffet, and fast food.  Limit or avoid alcohol.  Limit foods that are high in starch and sugar.  Avoid fried foods.  Lose weight if you are overweight.  Keep track of how much salt (sodium) you eat. This is important if you have high blood pressure. Ask your doctor to tell you more about this.  Try to add vegetarian meals each week. Fats  Choose healthy fats. These include olive  oil and canola oil, flaxseeds, walnuts, almonds, and seeds.  Eat more omega-3 fats. These include salmon, mackerel, sardines, tuna, flaxseed oil, and ground flaxseeds. Try to eat fish at least 2 times each week.  Check food labels. Avoid foods with trans fats or high amounts of saturated fat.  Limit saturated fats. ? These are often found in animal products, such as meats, butter, and cream. ? These are also found in plant foods, such as palm oil, palm kernel oil, and coconut oil.  Avoid foods with partially hydrogenated oils in them. These have trans fats. Examples are stick margarine, some tub margarines, cookies, crackers, and other baked goods. What foods can I eat? Fruits All fresh, canned (in natural juice), or frozen fruits. Vegetables Fresh or frozen vegetables (raw, steamed, roasted, or grilled). Green salads. Grains Most grains. Choose whole wheat and whole grains most of the time. Rice and pasta, including brown rice and pastas made with whole wheat. Meats and other proteins Lean, well-trimmed beef, veal, pork, and lamb. Chicken and [place] without skin. All fish and shellfish. Wild duck, rabbit, pheasant, and venison. Egg whites or low-cholesterol egg substitutes. Dried beans, peas, lentils, and tofu. Seeds and most nuts. Dairy Low-fat or nonfat cheeses, including ricotta and mozzarella. Skim or 1% milk that is liquid, powdered, or evaporated. Buttermilk that is made with low-fat milk. Nonfat or low-fat yogurt. Fats and oils Non-hydrogenated (trans-free) margarines. Vegetable oils, including soybean, sesame, sunflower, olive, peanut, safflower, corn, canola, and cottonseed. Salad dressings or mayonnaise made with a vegetable oil. Beverages Mineral water. Coffee and tea. Diet carbonated beverages. Sweets and desserts Sherbet, gelatin, and fruit ice. Small amounts of dark chocolate. Limit all sweets and desserts. Seasonings and condiments All seasonings and condiments. The  items listed above may not be a complete list of foods and drinks you can eat. Contact a dietitian for more options. What foods should I avoid? Fruits Canned fruit in heavy syrup. Fruit in cream or butter sauce. Fried fruit. Limit coconut. Vegetables Vegetables cooked in cheese, cream, or butter sauce. Fried vegetables. Grains Breads that are made with saturated or trans fats, oils, or whole milk. Croissants. Sweet rolls. Donuts. High-fat crackers, such as cheese crackers. Meats and other proteins Fatty meats, such as hot dogs, ribs, sausage, bacon, rib-eye roast or steak. High-fat deli meats, such as salami and bologna. Caviar. Domestic duck and goose. Organ meats, such as liver. Dairy Cream, sour cream, cream cheese, and creamed cottage cheese. Whole-milk cheeses. Whole or 2% milk that is liquid, evaporated, or condensed. Whole buttermilk. Cream sauce or high-fat cheese sauce. Yogurt that is made from whole milk. Fats and oils Meat fat, or shortening. Cocoa butter, hydrogenated oils, palm oil, coconut oil, palm kernel oil. Solid fats and shortenings, including bacon fat, salt pork, lard, and butter. Nondairy cream substitutes. Salad dressings with cheese or sour cream. Beverages Regular sodas and juice drinks with added sugar. Sweets and desserts Frosting. Pudding. Cookies. Cakes. Pies. Milk  chocolate or white chocolate. Buttered syrups. Full-fat ice cream or ice cream drinks. The items listed above may not be a complete list of foods and drinks to avoid. Contact a dietitian for more information. Summary  Heart-healthy meal planning includes eating less unhealthy fats, eating more healthy fats, and making other changes in your diet.  Eat a balanced diet. This includes fruits and vegetables, low-fat or nonfat dairy, lean protein, nuts and legumes, whole grains, and heart-healthy oils and fats. This information is not intended to replace advice given to you by your health care provider. Make  sure you discuss any questions you have with your health care provider. Document Released: 07/24/2011 Document Revised: 03/01/2017 Document Reviewed: 03/01/2017 Elsevier Interactive Patient Education  2019 [employer]

## 2018-08-05 ENCOUNTER — Other Ambulatory Visit: Payer: BLUE CROSS/BLUE SHIELD

## 2019-01-27 ENCOUNTER — Ambulatory Visit: Payer: BC Managed Care – PPO | Attending: Internal Medicine

## 2019-01-27 DIAGNOSIS — Z20822 Contact with and (suspected) exposure to covid-19: Secondary | ICD-10-CM

## 2019-01-27 DIAGNOSIS — Z20828 Contact with and (suspected) exposure to other viral communicable diseases: Secondary | ICD-10-CM | POA: Diagnosis not present

## 2019-01-28 LAB — NOVEL CORONAVIRUS, NAA: SARS-CoV-2, NAA: NOT DETECTED

## 2019-06-12 ENCOUNTER — Other Ambulatory Visit: Payer: Self-pay | Admitting: Interventional Cardiology

## 2019-06-30 ENCOUNTER — Telehealth: Payer: Self-pay | Admitting: Interventional Cardiology

## 2019-06-30 ENCOUNTER — Emergency Department (HOSPITAL_COMMUNITY)
Admission: EM | Admit: 2019-06-30 | Discharge: 2019-06-30 | Disposition: A | Discharge status: Home / Self Care | Payer: BC Managed Care – PPO | Attending: Emergency Medicine | Admitting: Emergency Medicine

## 2019-06-30 ENCOUNTER — Other Ambulatory Visit: Payer: Self-pay

## 2019-06-30 ENCOUNTER — Encounter (HOSPITAL_COMMUNITY): Payer: Self-pay

## 2019-06-30 ENCOUNTER — Emergency Department (HOSPITAL_COMMUNITY): Payer: BC Managed Care – PPO

## 2019-06-30 DIAGNOSIS — R002 Palpitations: Secondary | ICD-10-CM

## 2019-06-30 DIAGNOSIS — R0602 Shortness of breath: Secondary | ICD-10-CM | POA: Insufficient documentation

## 2019-06-30 DIAGNOSIS — R079 Chest pain, unspecified: Secondary | ICD-10-CM | POA: Diagnosis not present

## 2019-06-30 LAB — TROPONIN I (HIGH SENSITIVITY)
Troponin I (High Sensitivity): 7 ng/L (ref <18)
Troponin I (High Sensitivity): 8 ng/L (ref <18)

## 2019-06-30 LAB — BASIC METABOLIC PANEL
Anion gap: 12 (ref 5–15)
BUN: 25 mg/dL — ABNORMAL HIGH (ref 6–20)
CO2: 20 mmol/L — ABNORMAL LOW (ref 22–32)
Calcium: 9.1 mg/dL (ref 8.9–10.3)
Chloride: 102 mmol/L (ref 98–111)
Creatinine, Ser: 1.2 mg/dL (ref 0.61–1.24)
GFR calc Af Amer: >60 mL/min (ref >60)
GFR calc non Af Amer: >60 mL/min (ref >60)
Glucose, Bld: 178 mg/dL — ABNORMAL HIGH (ref 70–99)
Potassium: 3.7 mmol/L (ref 3.5–5.1)
Sodium: 134 mmol/L — ABNORMAL LOW (ref 135–145)

## 2019-06-30 LAB — CBC
HCT: 41.4 % (ref 39.0–52.0)
Hemoglobin: 14 g/dL (ref 13.0–17.0)
MCH: 34.4 pg — ABNORMAL HIGH (ref 26.0–34.0)
MCHC: 33.8 g/dL (ref 30.0–36.0)
MCV: 101.7 fL — ABNORMAL HIGH (ref 80.0–100.0)
Platelets: 203 10*3/uL (ref 150–400)
RBC: 4.07 MIL/uL — ABNORMAL LOW (ref 4.22–5.81)
RDW: 11.6 % (ref 11.5–15.5)
WBC: 9.8 10*3/uL (ref 4.0–10.5)
nRBC: 0 % (ref 0.0–0.2)

## 2019-06-30 LAB — D-DIMER, QUANTITATIVE: D-Dimer, Quant: <0.27 ug/mL-FEU (ref 0.00–0.50)

## 2019-06-30 IMAGING — CR DG CHEST 2V
2 series · 2 of 2 positions shown · non-contrast
Comparison: [DATE].

CLINICAL DATA: Chest pain.

EXAM:
CHEST - 2 VIEW

[chest pa]
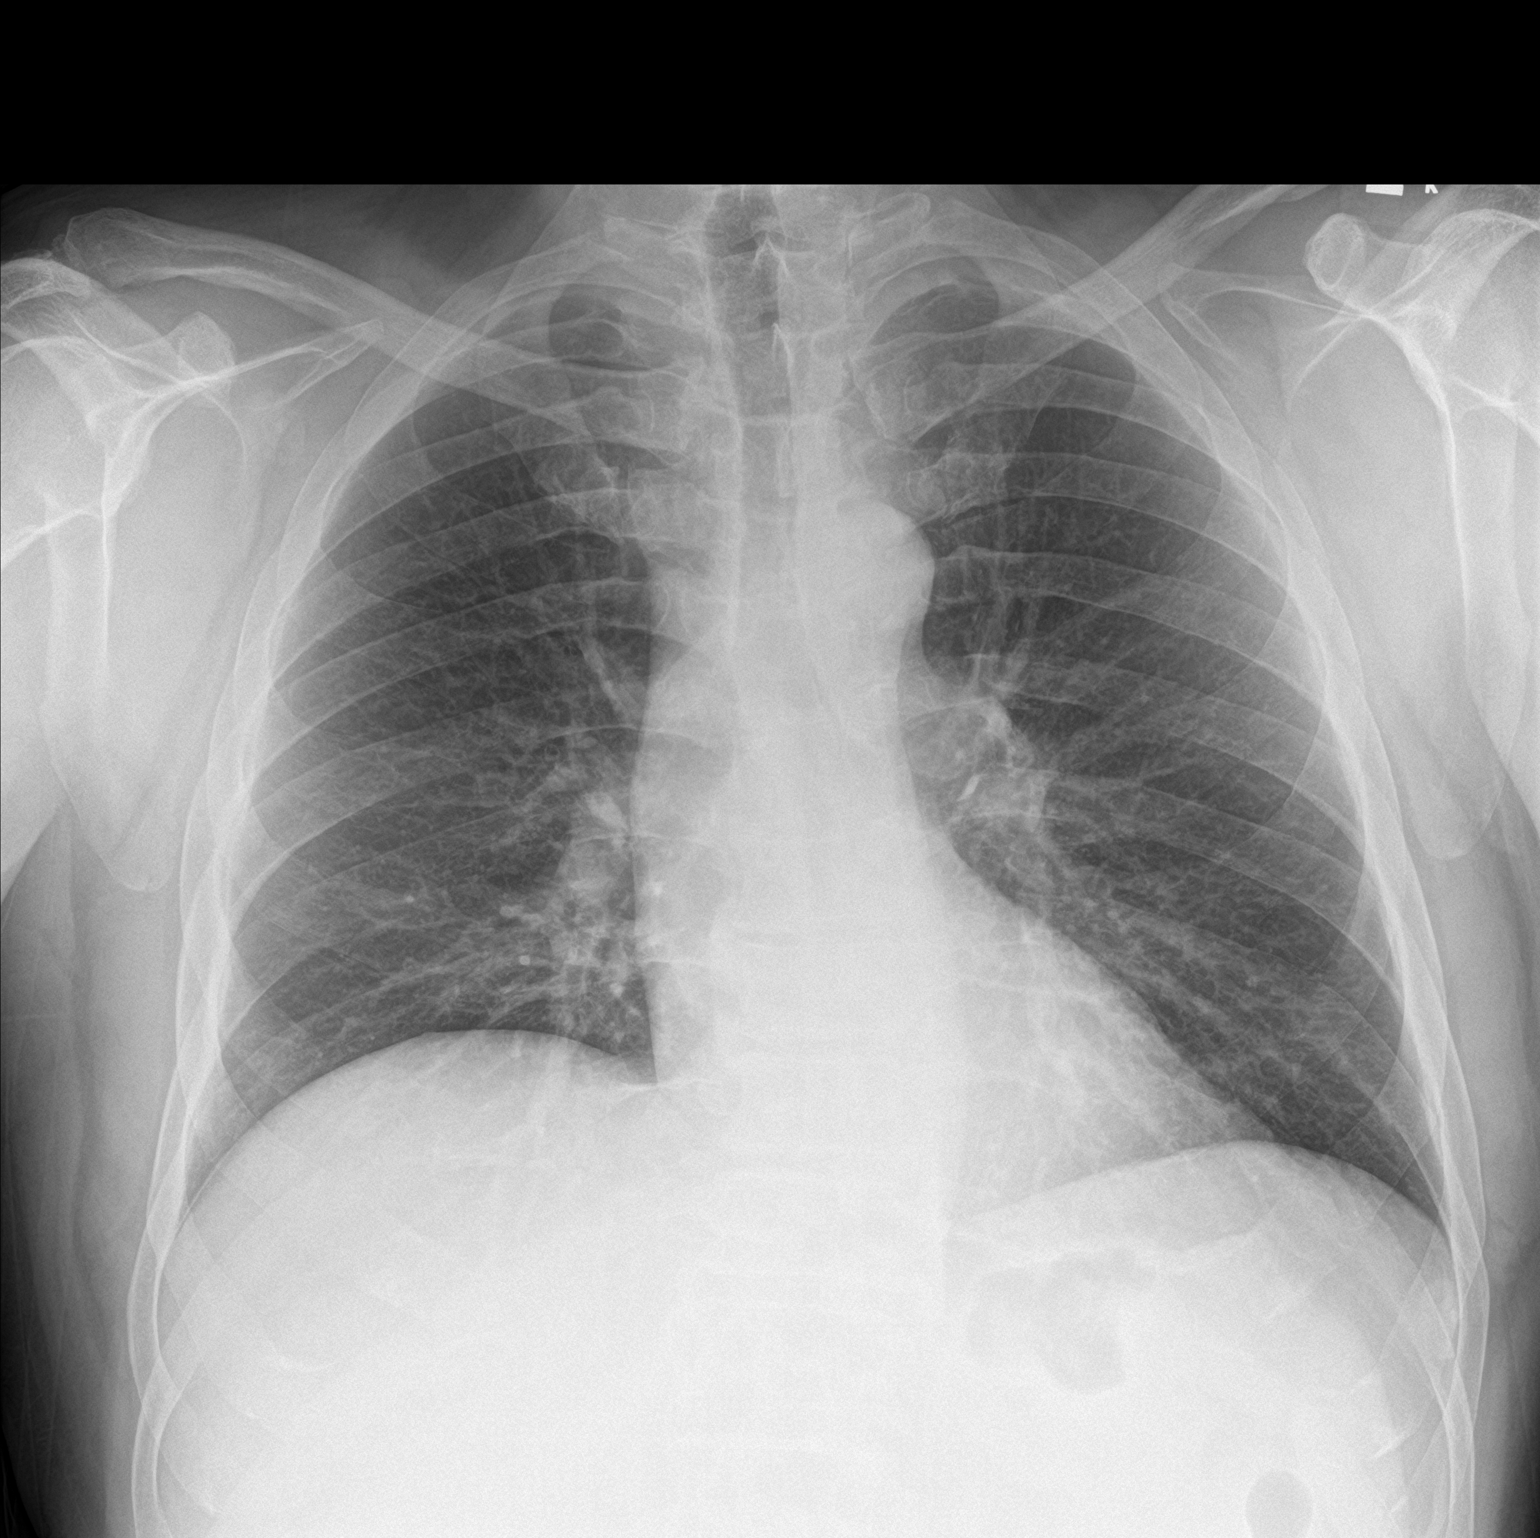

[chest lat]
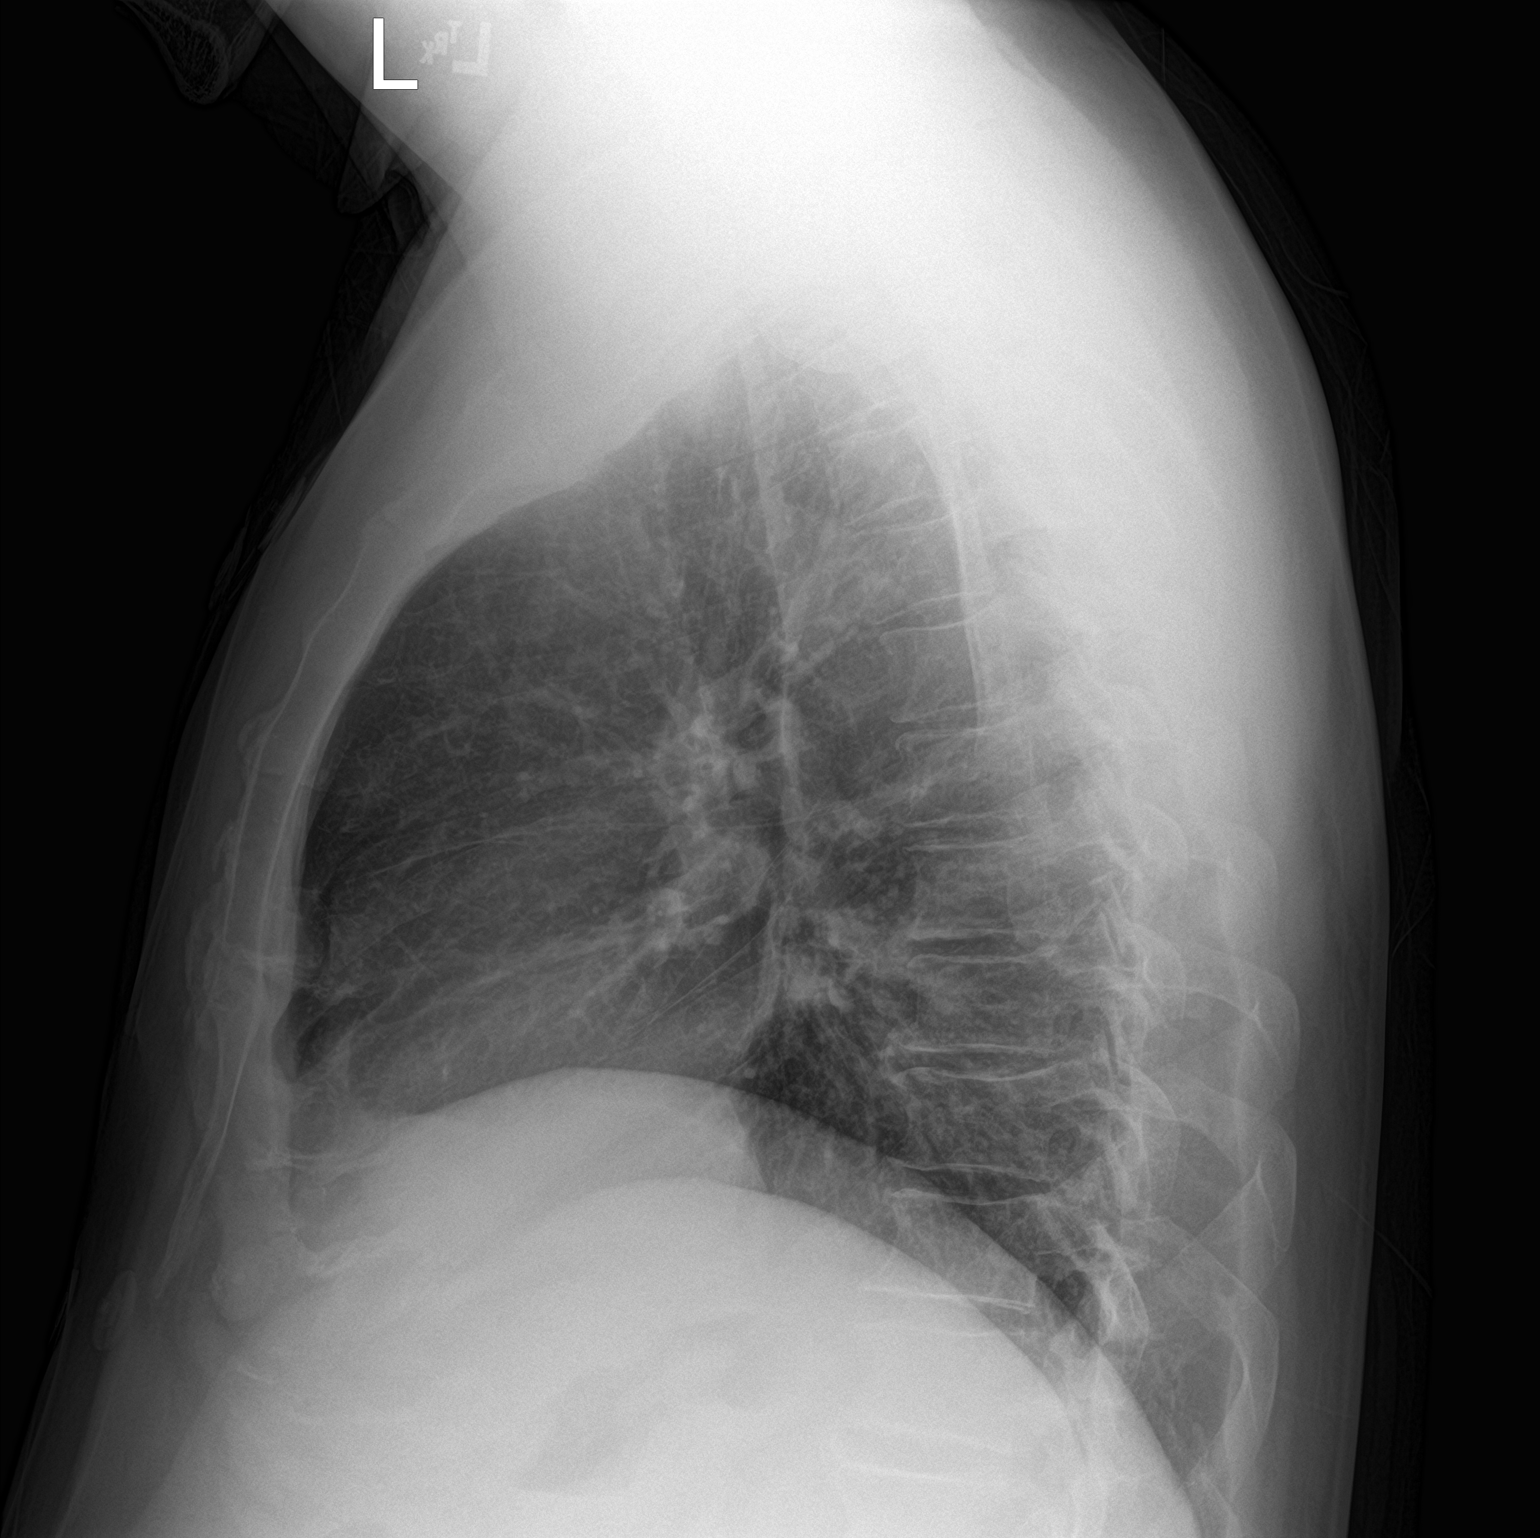

[2 of 2 positions shown; findings below may reference images not displayed]

FINDINGS: The heart size and mediastinal contours are within normal limits.
Both lungs are clear. No pneumothorax or pleural effusion is noted.
The visualized skeletal structures are unremarkable.
IMPRESSION: No active cardiopulmonary disease.

## 2019-06-30 NOTE — ED Notes (Signed)
Verbalized understanding of DC instructions and follow up care with PCP/Cards

## 2019-06-30 NOTE — ED Triage Notes (Signed)
Pt reports intermittent tachycardia, feeling like he was gonna pass out. Denies chest pain or palpitations. Pt also reports SOB, having a hard time catching his breath. Pt a.o, HR 140 in triage.

## 2019-06-30 NOTE — ED Provider Notes (Signed)
[facility] EMERGENCY DEPARTMENT Provider Note   CSN: [id number] Arrival date & time: 06/30/19  6283     History Chief Complaint  Patient presents with  . Dizziness  . Shortness of Breath  . Tachycardia    Jesse Baker is a 54 y.o. male.  HPI      Had sinus infection last week-congestion, was not seen, improved now Yesterday while working, thought bp was high but didn't have anything to check Diaphoresis, lightheadedness, felt like going to pass out Sat down and started to do more and felt worse This AM pulse elevated, BP high, pulse was 120s Mild shortness of breath, mostly feeling lightheaded Episodes last until sit down and rest, better after 30 minutes Episodes of dyspnea, heart racing, dizziness since yesterday, improved later in day Feels back to baseline now, no continuing symptoms No chest pain  No cough, no fever, no n/v/d/black or bloody stools  No medication changes, hadn't taken any OTC medications  No leg pain or swelling, long trips in car/airplane,no hx of DVT/PE Grandmother had MI hx   Past Medical History:  Diagnosis Date  . Allergy   . Hyperlipidemia   . Hypertension   . Seasonal allergies     Patient Active Problem List   Diagnosis Date Noted  . Hyperlipidemia 11/29/2014  . Nonspecific abnormal electrocardiogram (ECG) (EKG) 11/03/2013  . Essential hypertension, benign 11/03/2013  . Obesity, unspecified 11/03/2013    Past Surgical History:  Procedure Laterality Date  . BACK SURGERY     2012, lumbar discectomy        Family History  Problem Relation Age of Onset  . Hypertension Mother   . Heart attack Paternal Grandmother   . Cancer Paternal Grandfather        possible prostate cancer  . Stroke Neg Hx     Social History   Tobacco Use  . Smoking status: Former Research scientist (life sciences)  . Smokeless tobacco: Never Used  Substance Use Topics  . Alcohol use: Yes  . Drug use: No    Home Medications Prior to Admission  medications   Medication Sig Start Date End Date Taking? Authorizing Provider  amLODipine (NORVASC) 10 MG tablet Take 1 tablet (10 mg total) by mouth daily. 06/05/18   Jettie Booze, MD  aspirin EC 81 MG tablet Take 81 mg by mouth daily.    [provider]  atorvastatin (LIPITOR) 10 MG tablet Take 1 tablet (10 mg total) by mouth daily. 06/05/18   Jettie Booze, MD  fluticasone [facility]) 50 MCG/ACT nasal spray Place 2 sprays into both nostrils daily.    [provider]  loratadine (CLARITIN) 10 MG tablet Take 10 mg by mouth daily.    [provider]  losartan (COZAAR) 100 MG tablet Take 1 tablet (100 mg total) by mouth daily. Please make overdue appt with Dr. Irish Lack before anymore refills. 1st attempt 06/12/19   Jettie Booze, MD  metoprolol succinate (TOPROL-XL) 100 MG 24 hr tablet Take 1 tablet (100 mg total) by mouth at bedtime. 06/05/18   Jettie Booze, MD  sildenafil (REVATIO) 20 MG tablet Take 3-5 tablets 1 hour prior to sexual activity 06/05/18   Jettie Booze, MD    Allergies    Patient has no known allergies.  Review of Systems   Review of Systems  Constitutional: Negative for fever.  HENT: Negative for sore throat.   Eyes: Negative for visual disturbance.  Respiratory: Positive for shortness of breath. Negative  for cough.   Cardiovascular: Positive for palpitations. Negative for chest pain.  Gastrointestinal: Negative for abdominal pain, constipation, diarrhea, nausea and vomiting.  Genitourinary: Negative for difficulty urinating and dysuria.  Musculoskeletal: Negative for neck stiffness.  Skin: Negative for rash.  Neurological: Positive for light-headedness. Negative for syncope, weakness, numbness (woke up with numbness in left arm, will sometimes wake up with it, thinks from sleeping on it, not happening during the day) and headaches.    Physical Exam Updated Vital Signs BP (!) 136/92 (BP Location: Left Arm)    Pulse 89   Temp 98.1 F (36.7 C) (Oral)   Resp 16   Ht 6\' 3"  (1.905 m)   Wt 111.1 kg   SpO2 97%   BMI 30.62 kg/m   Physical Exam Vitals and nursing note reviewed.  Constitutional:      General: He is not in acute distress.    Appearance: He is well-developed. He is not diaphoretic.  HENT:     Head: Normocephalic and atraumatic.  Eyes:     Conjunctiva/sclera: Conjunctivae normal.  Cardiovascular:     Rate and Rhythm: Normal rate and regular rhythm.     Heart sounds: Normal heart sounds. No murmur. No friction rub. No gallop.   Pulmonary:     Effort: Pulmonary effort is normal. No respiratory distress.     Breath sounds: Normal breath sounds. No wheezing or rales.  Abdominal:     General: There is no distension.     Palpations: Abdomen is soft.     Tenderness: There is no abdominal tenderness. There is no guarding.  Musculoskeletal:     Cervical back: Normal range of motion.  Skin:    General: Skin is warm and dry.  Neurological:     Mental Status: He is alert and oriented to person, place, and time.     ED Results / Procedures / Treatments   Labs (all labs ordered are listed, but only abnormal results are displayed) Labs Reviewed  BASIC METABOLIC PANEL - Abnormal; Notable for the following components:      Result Value   Sodium 134 (*)    CO2 20 (*)    Glucose, Bld 178 (*)    BUN 25 (*)    All other components within normal limits  CBC - Abnormal; Notable for the following components:   RBC 4.07 (*)    MCV 101.7 (*)    MCH 34.4 (*)    All other components within normal limits  D-DIMER, QUANTITATIVE (NOT AT [facility])  TROPONIN I (HIGH SENSITIVITY)  TROPONIN I (HIGH SENSITIVITY)    EKG EKG Interpretation  Date/Time:  Tuesday Jun 30 2019 08:25:17 EDT Ventricular Rate:  126 PR Interval:  156 QRS Duration: 138 QT Interval:  428 QTC Calculation: 619 R Axis:   -75 Text Interpretation: Sinus tachycardia Right bundle branch block Left anterior fascicular block  Bifascicular block Left ventricular hypertrophy with repolarization abnormality ( R in aVL ) Abnormal ECG No significant change since 11/02/2013 Reconfirmed by 11/04/2013 (Alvira Monday) on 07/01/2019 8:44:32 AM   Radiology DG Chest 2 View  Result Date: 06/30/2019 CLINICAL DATA:  Chest pain. EXAM: CHEST - 2 VIEW COMPARISON:  October 29, 2013. FINDINGS: The heart size and mediastinal contours are within normal limits. Both lungs are clear. No pneumothorax or pleural effusion is noted. The visualized skeletal structures are unremarkable. IMPRESSION: No active cardiopulmonary disease. Electronically Signed   By: October 31, 2013 M.D.   On: 06/30/2019 09:03  Procedures Procedures (including critical care time)  Medications Ordered in ED Medications - No data to display  ED Course  I have reviewed the triage vital signs and the nursing notes.  Pertinent labs & imaging results that were available during my care of the patient were reviewed by me and considered in my medical decision making (see chart for details).    MDM Rules/Calculators/A&P                      Very pleasant 54yo male with history of htn, hyperlipidemia presents with concern for palpitations, lightheadedness and dyspnea.  Labs show no significant electrolyte abnormalities, no anemia.  CXR without pneumonia, pneumothorax or pulmonary edema.  ECG consistent with sinus tachycardia with bifascicular block.  Delta troponins negative, no sign of ACS. Low risk Wells and ddimer negative and doubt PE.  Recommend follow up with Cardiology for further evaluation of symptoms.  Patient discharged in stable condition with understanding of reasons to return.   Final Clinical Impression(s) / ED Diagnoses Final diagnoses:  Palpitations  Shortness of breath    Rx / DC Orders ED Discharge Orders    None       Alvira Monday, MD 07/01/19 [phone]

## 2019-06-30 NOTE — Telephone Encounter (Signed)
New Message  Pt c/o Shortness Of Breath: STAT if SOB developed within the last 24 hours or pt is noticeably SOB on the phone  1. Are you currently SOB (can you hear that pt is SOB on the phone)? Yes  2. How long have you been experiencing SOB? Yesterday  3. Are you SOB when sitting or when up moving around? Moving around   4. Are you currently experiencing any other symptoms? Heartrate is high, lightheadedness, BP high,

## 2019-06-30 NOTE — Telephone Encounter (Signed)
Spoke with patient who reports chest pounding, fast pounding heart beat 110-125 and shortness of breath, lightheadedness and dizziness and intermittent left arm numbness.  Worse with activities.  This happened yesterday morning and eased off.  He is experiencing this again right now. BP currently is 136/95.  I adv patient he needs urgent evaluation at [facility] ER.  He is close by.  Someone will drive him there now.

## 2019-07-01 ENCOUNTER — Telehealth: Payer: Self-pay | Admitting: Interventional Cardiology

## 2019-07-01 NOTE — Telephone Encounter (Signed)
Called and spoke to patient. He has been scheduled with Dr. Eldridge Dace this Friday 5/28.

## 2019-07-01 NOTE — Telephone Encounter (Signed)
   Pt said he was in the ED yesterday and needs to f/u with Dr. Eldridge Dace. Provide first available appt. Pt would like to speak with his nurse to see if he get be seen sooner. Also pt accepted VV appt with Georgie Chard

## 2019-07-02 NOTE — Progress Notes (Signed)
Virtual Visit via Video Note   This visit type was conducted due to national recommendations for restrictions regarding the COVID-19 Pandemic (e.g. social distancing) in an effort to limit this patient's exposure and mitigate transmission in our community.  Due to his co-morbid illnesses, this patient is at least at moderate risk for complications without adequate follow up.  This format is felt to be most appropriate for this patient at this time.  All issues noted in this document were discussed and addressed.  A limited physical exam was performed with this format.  Please refer to the patient's chart for his consent to telehealth for [facility].   The patient was identified using 2 identifiers.  Date:  07/03/2019   ID:  Jesse Baker, DOB [birth year], MRN [id number]  Patient Location: Home Provider Location: Office  PCP:  Susy Frizzle, MD  Cardiologist:  No primary care provider on file. [place] Electrophysiologist:  None   Evaluation Performed:  Follow-Up Visit  Chief Complaint:  Palpitaitons  History of Present Illness:    NAI DASCH is a 54 y.o. male with who has had HTN for more than 10 years.  He has had issues with maintaining a healthy diet and regular exercise.   Patient with palpitations which prompted a trip to the emergency room on May 25.  He was found to have sinus tachycardia.  He had negative troponins and a negative D-dimer.  He was sent home for further work-up as an outpatient.  History from the ER as follows: "Had sinus infection last week-congestion, was not seen, improved now Yesterday while working, thought bp was high but didn't have anything to check Diaphoresis, lightheadedness, felt like going to pass out Sat down and started to do more and felt worse This AM pulse elevated, BP high, pulse was 120s Mild shortness of breath, mostly feeling lightheaded Episodes last until sit down and rest, better after 30 minutes Episodes of  dyspnea, heart racing, dizziness since yesterday, improved later in day Feels back to baseline now, no continuing symptoms No chest pain  No cough, no fever, no n/v/d/black or bloody stools  No medication changes, hadn't taken any OTC medications  No leg pain or swelling, long trips in car/airplane,no hx of DVT/PE Grandmother had MI hx"   He felt his BP was high, but BP was normal when he checked it.  Sx started Monday night.  Sx got better with rest.  Next day HR was high and he went to the ER.  Negative w/u.  On the following day, Wednesday, Hr was 110, and BO was low, 90/60 range.   He had been eating and drinking normally.  He was sweating a lot, but no chills or fever.  Still has some fatigue.   Denies : Chest pain. Leg edema. Nitroglycerin use. Orthopnea. Paroxysmal nocturnal dyspnea.  Syncope.   Has had some DOE with walking up stairs while at work.   The patient does not have symptoms concerning for COVID-19 infection (fever, chills, cough, or new shortness of breath).    Past Medical History:  Diagnosis Date  . Allergy   . Hyperlipidemia   . Hypertension   . Seasonal allergies    Past Surgical History:  Procedure Laterality Date  . BACK SURGERY     2012, lumbar discectomy      Current Meds  Medication Sig  . amLODipine (NORVASC) 10 MG tablet Take 1 tablet (10 mg total) by mouth daily.  Marland Kitchen aspirin EC 81  MG tablet Take 81 mg by mouth daily.  Marland Kitchen atorvastatin (LIPITOR) 10 MG tablet Take 1 tablet (10 mg total) by mouth daily.  . fluticasone (FLONASE) 50 MCG/ACT nasal spray Place 2 sprays into both nostrils daily.  Marland Kitchen loratadine (CLARITIN) 10 MG tablet Take 10 mg by mouth daily.  Marland Kitchen losartan (COZAAR) 100 MG tablet Take 1 tablet (100 mg total) by mouth daily. Please make overdue appt with Dr. Eldridge Dace before anymore refills. 1st attempt  . metoprolol succinate (TOPROL-XL) 100 MG 24 hr tablet Take 1 tablet (100 mg total) by mouth at bedtime. (Patient taking differently:  Take 100 mg by mouth daily. )  . sildenafil (REVATIO) 20 MG tablet Take 3-5 tablets 1 hour prior to sexual activity     Allergies:   Patient has no known allergies.   Social History   Tobacco Use  . Smoking status: Former Games developer  . Smokeless tobacco: Never Used  Substance Use Topics  . Alcohol use: Yes  . Drug use: No     Family Hx: The patient's family history includes Cancer in his paternal grandfather; Heart attack in his paternal grandmother; Hypertension in his mother. There is no history of Stroke.  ROS:   Please see the history of present illness.    palpitations All other systems reviewed and are negative.   Prior CV studies:   The following studies were reviewed today: 2014, normal renal Duplex   Labs/Other Tests and Data Reviewed:    EKG:  An ECG dated 06/30/19 was personally reviewed today and demonstrated:  sinus tach, RBBB  Recent Labs: 06/30/2019: BUN 25; Creatinine, Ser 1.20; Hemoglobin 14.0; Platelets 203; Potassium 3.7; Sodium 134   Recent Lipid Panel No results found for: CHOL, TRIG, HDL, CHOLHDL, LDLCALC, LDLDIRECT  Wt Readings from Last 3 Encounters:  07/03/19 245 lb (111.1 kg)  06/30/19 245 lb (111.1 kg)  06/05/18 255 lb (115.7 kg)     Objective:    Vital Signs:  BP 102/84   Pulse 92   Ht 6\' 3"  (1.905 m)   Wt 245 lb (111.1 kg)   BMI 30.62 kg/m    VITAL SIGNS:  reviewed GEN:  no acute distress CARDIOVASCULAR:  no shortness of breath NEURO:  alert and oriented x 3, no obvious focal deficit PSYCH:  normal affect exam limited by video format  ASSESSMENT & PLAN:    1. Hypertension: Controlled for the most part.  Stay well hydrated.  He minimizes caffeine.  2. DOE: With walking up stairs.  Check echo.  3. Hyperlipidemia: Check lipids when fasting. 4. Palpitations: Sinus tach noted in ER.  Consider 14 day Zio patch if palpitations return.  Check TSH as well.  5. Obesity: He has lost 10 lbs with dietary changes.  Elevated blood glucose.   WiIl check A1C.     COVID-19 Education: The signs and symptoms of COVID-19 were discussed with the patient and how to seek care for testing (follow up with PCP or arrange E-visit).  The importance of social distancing was discussed today.  Time:   Today, I have spent  minutes with the patient with telehealth technology discussing the above problems.     Medication Adjustments/Labs and Tests Ordered: Current medicines are reviewed at length with the patient today.  Concerns regarding medicines are outlined above.   Tests Ordered: No orders of the defined types were placed in this encounter.   Medication Changes: No orders of the defined types were placed in this encounter.   Follow  Up:  Either In Person or Virtual in 3 month(s)  Signed, Lance Muss, MD  07/03/2019 8:43 AM    [facility]

## 2019-07-03 ENCOUNTER — Encounter: Payer: Self-pay | Admitting: Interventional Cardiology

## 2019-07-03 ENCOUNTER — Telehealth (INDEPENDENT_AMBULATORY_CARE_PROVIDER_SITE_OTHER): Payer: BC Managed Care – PPO | Admitting: Interventional Cardiology

## 2019-07-03 ENCOUNTER — Other Ambulatory Visit: Payer: Self-pay

## 2019-07-03 VITALS — BP 102/84 | HR 92 | Ht 75.0 in | Wt 245.0 lb

## 2019-07-03 DIAGNOSIS — I10 Essential (primary) hypertension: Secondary | ICD-10-CM | POA: Diagnosis not present

## 2019-07-03 DIAGNOSIS — E669 Obesity, unspecified: Secondary | ICD-10-CM | POA: Diagnosis not present

## 2019-07-03 DIAGNOSIS — R739 Hyperglycemia, unspecified: Secondary | ICD-10-CM

## 2019-07-03 DIAGNOSIS — R0600 Dyspnea, unspecified: Secondary | ICD-10-CM

## 2019-07-03 DIAGNOSIS — E782 Mixed hyperlipidemia: Secondary | ICD-10-CM | POA: Diagnosis not present

## 2019-07-03 DIAGNOSIS — R0609 Other forms of dyspnea: Secondary | ICD-10-CM

## 2019-07-03 DIAGNOSIS — R002 Palpitations: Secondary | ICD-10-CM

## 2019-07-03 MED ORDER — AMLODIPINE BESYLATE 10 MG PO TABS: 10.0000 mg | ORAL_TABLET | Freq: Every day | ORAL | 3 refills | Status: DC

## 2019-07-03 MED ORDER — ATORVASTATIN CALCIUM 10 MG PO TABS: 10.0000 mg | ORAL_TABLET | Freq: Every day | ORAL | 3 refills | Status: DC

## 2019-07-03 MED ORDER — LOSARTAN POTASSIUM 100 MG PO TABS: 100.0000 mg | ORAL_TABLET | Freq: Every day | ORAL | 3 refills | Status: DC

## 2019-07-03 MED ORDER — METOPROLOL SUCCINATE ER 100 MG PO TB24: 100.0000 mg | ORAL_TABLET | Freq: Every day | ORAL | 3 refills | Status: DC

## 2019-07-03 NOTE — Patient Instructions (Signed)
Medication Instructions:  Your physician recommends that you continue on your current medications as directed. Please refer to the Current Medication list given to you today.  *If you need a refill on your cardiac medications before your next appointment, please call your pharmacy*   Lab Work: Your physician recommends that you return for a FASTING LIPIDS, TSH, and A1C on 07/23/19 (same day as your echocardiogram)  If you have labs (blood work) drawn today and your tests are completely normal, you will receive your results only by: Marland Kitchen MyChart Message (if you have MyChart) OR . A paper copy in the mail If you have any lab test that is abnormal or we need to change your treatment, we will call you to review the results.   Testing/Procedures: Your physician has requested that you have an echocardiogram on 07/23/19 at 7:15 AM. Please arrive at 7:00 AM. Echocardiography is a painless test that uses sound waves to create images of your heart. It provides your doctor with information about the size and shape of your heart and how well your heart's chambers and valves are working. This procedure takes approximately one hour. There are no restrictions for this procedure.  Follow-Up: Follow up with Dr. Eldridge Dace on 10/06/19 at 8:00 AM   Other Instructions Your physician has requested that you regularly monitor and record your blood pressure readings at home. Please use the same machine at the same time of day to check your readings and record them and send [place] your readings through MyChart.

## 2019-07-07 ENCOUNTER — Telehealth: Payer: BC Managed Care – PPO | Admitting: Cardiology

## 2019-07-20 NOTE — Telephone Encounter (Signed)
Called and spoke to the patient. He has been scheduled for an EKG on 6/17.

## 2019-07-23 ENCOUNTER — Other Ambulatory Visit: Payer: BC Managed Care – PPO | Admitting: *Deleted

## 2019-07-23 ENCOUNTER — Ambulatory Visit (HOSPITAL_COMMUNITY): Payer: BC Managed Care – PPO | Attending: Cardiovascular Disease

## 2019-07-23 ENCOUNTER — Ambulatory Visit (INDEPENDENT_AMBULATORY_CARE_PROVIDER_SITE_OTHER): Payer: BC Managed Care – PPO

## 2019-07-23 ENCOUNTER — Other Ambulatory Visit: Payer: Self-pay

## 2019-07-23 VITALS — BP 122/74 | HR 70 | Ht 75.0 in | Wt 246.0 lb

## 2019-07-23 DIAGNOSIS — E782 Mixed hyperlipidemia: Secondary | ICD-10-CM

## 2019-07-23 DIAGNOSIS — R002 Palpitations: Secondary | ICD-10-CM

## 2019-07-23 DIAGNOSIS — R0609 Other forms of dyspnea: Secondary | ICD-10-CM

## 2019-07-23 DIAGNOSIS — R0600 Dyspnea, unspecified: Secondary | ICD-10-CM

## 2019-07-23 DIAGNOSIS — I10 Essential (primary) hypertension: Secondary | ICD-10-CM

## 2019-07-23 DIAGNOSIS — R739 Hyperglycemia, unspecified: Secondary | ICD-10-CM | POA: Diagnosis not present

## 2019-07-23 DIAGNOSIS — E669 Obesity, unspecified: Secondary | ICD-10-CM

## 2019-07-23 LAB — LIPID PANEL
Chol/HDL Ratio: 2.5 ratio (ref 0.0–5.0)
Cholesterol, Total: 139 mg/dL (ref 100–199)
HDL: 55 mg/dL (ref >39)
LDL Chol Calc (NIH): 60 mg/dL (ref 0–99)
Triglycerides: 139 mg/dL (ref 0–149)
VLDL Cholesterol Cal: 24 mg/dL (ref 5–40)

## 2019-07-23 LAB — HEMOGLOBIN A1C
Est. average glucose Bld gHb Est-mCnc: 100 mg/dL
Hgb A1c MFr Bld: 5.1 % (ref 4.8–5.6)

## 2019-07-23 LAB — TSH: TSH: 4.56 u[IU]/mL — ABNORMAL HIGH (ref 0.450–4.500)

## 2019-07-23 NOTE — Progress Notes (Signed)
1.) Reason for visit: EKG  2.) Name of MD requesting visit: Dr. Eldridge Dace  3.) H&P: HTN, DOE, HLD, Palpitations   4.) Assessment and plan per MD: Patient presented to ER on 5/25 with palpitations and EKG showed ST w/ RBBB. Patient returning today for echocardiogram and labs ordered at visit on 5/28 and requesting repeat EKG to see if RBBB still there. Patient denies having any chest pain, SOB, palpitations, or any other Sx. EKG showing NSR, LAFB (which was present on prior EKG). Made patient aware that RBBB was probably rate dependent since his HR was elevated in the ER. EKG reviewed with DOD. No changes at this time. Patient aware that we will call him with his echo and lab results.

## 2019-07-23 NOTE — Progress Notes (Signed)
RBBB resolved with slower HR.    JV

## 2019-09-22 DIAGNOSIS — M545 Low back pain: Secondary | ICD-10-CM | POA: Diagnosis not present

## 2019-10-05 NOTE — Progress Notes (Signed)
Cardiology Office Note   Date:  10/06/2019   ID:  Jesse, Baker [birth year], MRN [id number]  PCP:  Donita Brooks, MD    No chief complaint on file.  HTN  Wt Readings from Last 3 Encounters:  10/06/19 243 lb (110.2 kg)  07/23/19 246 lb (111.6 kg)  07/03/19 245 lb (111.1 kg)       History of Present Illness: Jesse Baker is a 54 y.o. male  who has had HTN for more than 10 years.  He has had issues with maintaining a healthy diet and regular exercise.  Patient with palpitations which prompted a trip to the emergency room on May 25.  He was found to have sinus tachycardia.  He had negative troponins and a negative D-dimer.  He was sent home for further work-up as an outpatient.  History from the ER as follows: "Had sinus infection last week-congestion, was not seen, improved now"  Has had sinus tachycardia, with plans for 14 day Zio patch if sx return.   Since the last visit, he has done well.  BP has been controlled.  HR has well.  Denies : Chest pain. Dizziness. Leg edema. Nitroglycerin use. Orthopnea. Palpitations. Paroxysmal nocturnal dyspnea. Shortness of breath. Syncope.   Has chronic back pain which limits exercise.   He had both COVID vaccinesWater engineer.    Past Medical History:  Diagnosis Date  . Allergy   . Hyperlipidemia   . Hypertension   . Seasonal allergies     Past Surgical History:  Procedure Laterality Date  . BACK SURGERY     2012, lumbar discectomy      Current Outpatient Medications  Medication Sig Dispense Refill  . amLODipine (NORVASC) 10 MG tablet Take 1 tablet (10 mg total) by mouth daily. 90 tablet 3  . amoxicillin (AMOXIL) 500 MG capsule Take by mouth as directed.    Marland Kitchen aspirin EC 81 MG tablet Take 81 mg by mouth daily.    Marland Kitchen atorvastatin (LIPITOR) 10 MG tablet Take 1 tablet (10 mg total) by mouth daily. 90 tablet 3  . chlorhexidine (PERIDEX) 0.12 % solution as directed.    . diclofenac (VOLTAREN) 75 MG EC tablet  Take 75 mg by mouth in the morning and at bedtime.    . fluticasone (FLONASE) 50 MCG/ACT nasal spray Place 2 sprays into both nostrils daily.    Marland Kitchen loratadine (CLARITIN) 10 MG tablet Take 10 mg by mouth daily.    Marland Kitchen losartan (COZAAR) 100 MG tablet Take 1 tablet (100 mg total) by mouth daily. 90 tablet 3  . methocarbamol (ROBAXIN) 500 MG tablet daily.    . metoprolol succinate (TOPROL-XL) 100 MG 24 hr tablet Take 1 tablet (100 mg total) by mouth daily. 90 tablet 3  . sildenafil (REVATIO) 20 MG tablet Take 3-5 tablets 1 hour prior to sexual activity 30 tablet 11   No current facility-administered medications for this visit.    Allergies:   Patient has no known allergies.    Social History:  The patient  reports that he has quit smoking. He has never used smokeless tobacco. He reports current alcohol use. He reports that he does not use drugs.   Family History:  The patient's family history includes Cancer in his paternal grandfather; Heart attack in his paternal grandmother; Hypertension in his mother.    ROS:  Please see the history of present illness.   Otherwise, review of systems are positive for back pain.  All other systems are reviewed and negative.    PHYSICAL EXAM: VS:  BP 100/78   Pulse 79   Ht 6\' 3"  (1.905 m)   Wt 243 lb (110.2 kg)   SpO2 98%   BMI 30.37 kg/m  , BMI Body mass index is 30.37 kg/m. GEN: Well nourished, well developed, in no acute distress  HEENT: normal  Neck: no JVD, carotid bruits, or masses Cardiac: RRR; no murmurs, rubs, or gallops,no edema  Respiratory:  clear to auscultation bilaterally, normal work of breathing GI: soft, nontender, nondistended, + BS, obese MS: no deformity or atrophy  Skin: warm and dry, no rash Neuro:  Strength and sensation are intact Psych: euthymic mood, full affect   EKG:   The ekg ordered today demonstrates    Recent Labs: 06/30/2019: BUN 25; Creatinine, Ser 1.20; Hemoglobin 14.0; Platelets 203; Potassium 3.7; Sodium  134 07/23/2019: TSH 4.560   Lipid Panel    Component Value Date/Time   CHOL 139 07/23/2019 0807   TRIG 139 07/23/2019 0807   HDL 55 07/23/2019 0807   CHOLHDL 2.5 07/23/2019 0807   LDLCALC 60 07/23/2019 0807     Other studies Reviewed: Additional studies/ records that were reviewed today with results demonstrating: labs reviewed.   ASSESSMENT AND PLAN:  1. HTN: Low salt diet recommended.  He does eat out for lunch frequently.  We spoke about low sodium choices.  High-fiber diet recommended as well.  Blood pressure has been very well controlled of late. 2. DOE: Resolved.  Increase activity to help improve stamina.  His job is physical.  He has limited by chronic back pain.  3. Hyperlipidemia: The current medical regimen is effective;  continue present plan and medications.  Continue atorvastatin 4. Palpitations: Resolved.  TSH was slightly increased at last check.  He will follow-up with Dr. 07/25/2019. 5. Obesity: Increase activity to target below.     Current medicines are reviewed at length with the patient today.  The patient concerns regarding his medicines were addressed.  The following changes have been made:  No change  Labs/ tests ordered today include:  No orders of the defined types were placed in this encounter.   Recommend 150 minutes/week of aerobic exercise Low fat, low carb, high fiber diet recommended  Disposition:   FU in 1 year   Signed, Tanya Nones, MD  10/06/2019 8:18 AM    [facility] [address] [place], [place], [place]  [place] Phone: [phone]; Fax: [phone]

## 2019-10-06 ENCOUNTER — Encounter: Payer: Self-pay | Admitting: Interventional Cardiology

## 2019-10-06 ENCOUNTER — Ambulatory Visit (INDEPENDENT_AMBULATORY_CARE_PROVIDER_SITE_OTHER): Payer: BC Managed Care – PPO | Admitting: Interventional Cardiology

## 2019-10-06 ENCOUNTER — Other Ambulatory Visit: Payer: Self-pay

## 2019-10-06 VITALS — BP 100/78 | HR 79 | Ht 75.0 in | Wt 243.0 lb

## 2019-10-06 DIAGNOSIS — E782 Mixed hyperlipidemia: Secondary | ICD-10-CM

## 2019-10-06 DIAGNOSIS — R002 Palpitations: Secondary | ICD-10-CM | POA: Diagnosis not present

## 2019-10-06 DIAGNOSIS — R0600 Dyspnea, unspecified: Secondary | ICD-10-CM | POA: Diagnosis not present

## 2019-10-06 DIAGNOSIS — E669 Obesity, unspecified: Secondary | ICD-10-CM

## 2019-10-06 DIAGNOSIS — I10 Essential (primary) hypertension: Secondary | ICD-10-CM | POA: Diagnosis not present

## 2019-10-06 DIAGNOSIS — R0609 Other forms of dyspnea: Secondary | ICD-10-CM

## 2019-10-06 NOTE — Patient Instructions (Signed)
Medication Instructions:  Your physician recommends that you continue on your current medications as directed. Please refer to the Current Medication list given to you today.  *If you need a refill on your cardiac medications before your next appointment, please call your pharmacy*   Lab Work: None  If you have labs (blood work) drawn today and your tests are completely normal, you will receive your results only by: Marland Kitchen MyChart Message (if you have MyChart) OR . A paper copy in the mail If you have any lab test that is abnormal or we need to change your treatment, we will call you to review the results.   Testing/Procedures: None   Follow-Up: At [facility], you and your health needs are our priority.  As part of our continuing mission to provide you with exceptional heart care, we have created designated Provider Care Teams.  These Care Teams include your primary Cardiologist (physician) and Advanced Practice Providers (APPs -  Physician Assistants and Nurse Practitioners) who all work together to provide you with the care you need, when you need it.  We recommend signing up for the patient portal called "MyChart".  Sign up information is provided on this After Visit Summary.  MyChart is used to connect with patients for Virtual Visits (Telemedicine).  Patients are able to view lab/test results, encounter notes, upcoming appointments, etc.  Non-urgent messages can be sent to your provider as well.   To learn more about what you can do with MyChart, go to [email or web address].    Your next appointment:   12 month(s)  The format for your next appointment:   In Person  Provider:   You may see Lance Muss, MD or one of the following Advanced Practice Providers on your designated Care Team:    Ronie Spies, PA-C  Jacolyn Reedy, PA-C    Other Instructions  Low-Sodium Eating Plan Sodium, which is an element that makes up salt, helps you maintain a healthy balance of  fluids in your body. Too much sodium can increase your blood pressure and cause fluid and waste to be held in your body. Your health care provider or dietitian may recommend following this plan if you have high blood pressure (hypertension), kidney disease, liver disease, or heart failure. Eating less sodium can help lower your blood pressure, reduce swelling, and protect your heart, liver, and kidneys. What are tips for following this plan? General guidelines  Most people on this plan should limit their sodium intake to 1,500-2,000 mg (milligrams) of sodium each day. Reading food labels   The Nutrition Facts label lists the amount of sodium in one serving of the food. If you eat more than one serving, you must multiply the listed amount of sodium by the number of servings.  Choose foods with less than 140 mg of sodium per serving.  Avoid foods with 300 mg of sodium or more per serving. Shopping  Look for lower-sodium products, often labeled as "low-sodium" or "no salt added."  Always check the sodium content even if foods are labeled as "unsalted" or "no salt added".  Buy fresh foods. ? Avoid canned foods and premade or frozen meals. ? Avoid canned, cured, or processed meats  Buy breads that have less than 80 mg of sodium per slice. Cooking  Eat more home-cooked food and less restaurant, buffet, and fast food.  Avoid adding salt when cooking. Use salt-free seasonings or herbs instead of table salt or sea salt. Check with your health care provider or pharmacist  before using salt substitutes.  Cook with plant-based oils, such as canola, sunflower, or olive oil. Meal planning  When eating at a restaurant, ask that your food be prepared with less salt or no salt, if possible.  Avoid foods that contain MSG (monosodium glutamate). MSG is sometimes added to [place] food, bouillon, and some canned foods. What foods are recommended? The items listed may not be a complete list. Talk with  your dietitian about what dietary choices are best for you. Grains Low-sodium cereals, including oats, puffed wheat and rice, and shredded wheat. Low-sodium crackers. Unsalted rice. Unsalted pasta. Low-sodium bread. Whole-grain breads and whole-grain pasta. Vegetables Fresh or frozen vegetables. "No salt added" canned vegetables. "No salt added" tomato sauce and paste. Low-sodium or reduced-sodium tomato and vegetable juice. Fruits Fresh, frozen, or canned fruit. Fruit juice. Meats and other protein foods Fresh or frozen (no salt added) meat, poultry, seafood, and fish. Low-sodium canned tuna and salmon. Unsalted nuts. Dried peas, beans, and lentils without added salt. Unsalted canned beans. Eggs. Unsalted nut butters. Dairy Milk. Soy milk. Cheese that is naturally low in sodium, such as ricotta cheese, fresh mozzarella, or Swiss cheese Low-sodium or reduced-sodium cheese. Cream cheese. Yogurt. Fats and oils Unsalted butter. Unsalted margarine with no trans fat. Vegetable oils such as canola or olive oils. Seasonings and other foods Fresh and dried herbs and spices. Salt-free seasonings. Low-sodium mustard and ketchup. Sodium-free salad dressing. Sodium-free light mayonnaise. Fresh or refrigerated horseradish. Lemon juice. Vinegar. Homemade, reduced-sodium, or low-sodium soups. Unsalted popcorn and pretzels. Low-salt or salt-free chips. What foods are not recommended? The items listed may not be a complete list. Talk with your dietitian about what dietary choices are best for you. Grains Instant hot cereals. Bread stuffing, pancake, and biscuit mixes. Croutons. Seasoned rice or pasta mixes. Noodle soup cups. Boxed or frozen macaroni and cheese. Regular salted crackers. Self-rising flour. Vegetables Sauerkraut, pickled vegetables, and relishes. Olives. [place] fries. Onion rings. Regular canned vegetables (not low-sodium or reduced-sodium). Regular canned tomato sauce and paste (not low-sodium or  reduced-sodium). Regular tomato and vegetable juice (not low-sodium or reduced-sodium). Frozen vegetables in sauces. Meats and other protein foods Meat or fish that is salted, canned, smoked, spiced, or pickled. Bacon, ham, sausage, hotdogs, corned beef, chipped beef, packaged lunch meats, salt pork, jerky, pickled herring, anchovies, regular canned tuna, sardines, salted nuts. Dairy Processed cheese and cheese spreads. Cheese curds. Blue cheese. Feta cheese. String cheese. Regular cottage cheese. Buttermilk. Canned milk. Fats and oils Salted butter. Regular margarine. Ghee. Bacon fat. Seasonings and other foods Onion salt, garlic salt, seasoned salt, table salt, and sea salt. Canned and packaged gravies. Worcestershire sauce. Tartar sauce. Barbecue sauce. Teriyaki sauce. Soy sauce, including reduced-sodium. Steak sauce. Fish sauce. Oyster sauce. Cocktail sauce. Horseradish that you find on the shelf. Regular ketchup and mustard. Meat flavorings and tenderizers. Bouillon cubes. Hot sauce and Tabasco sauce. Premade or packaged marinades. Premade or packaged taco seasonings. Relishes. Regular salad dressings. Salsa. Potato and tortilla chips. Corn chips and puffs. Salted popcorn and pretzels. Canned or dried soups. Pizza. Frozen entrees and pot pies. Summary  Eating less sodium can help lower your blood pressure, reduce swelling, and protect your heart, liver, and kidneys.  Most people on this plan should limit their sodium intake to 1,500-2,000 mg (milligrams) of sodium each day.  Canned, boxed, and frozen foods are high in sodium. Restaurant foods, fast foods, and pizza are also very high in sodium. You also get sodium by adding salt to  food.  Try to cook at home, eat more fresh fruits and vegetables, and eat less fast food, canned, processed, or prepared foods. This information is not intended to replace advice given to you by your health care provider. Make sure you discuss any questions you have  with your health care provider. Document Revised: 01/04/2017 Document Reviewed: 01/16/2016 Elsevier Patient Education  2020 Elsevier Inc.  High-Fiber Diet Fiber, also called dietary fiber, is a type of carbohydrate that is found in fruits, vegetables, whole grains, and beans. A high-fiber diet can have many health benefits. Your health care provider may recommend a high-fiber diet to help:  Prevent constipation. Fiber can make your bowel movements more regular.  Lower your cholesterol.  Relieve the following conditions: ? Swelling of veins in the anus (hemorrhoids). ? Swelling and irritation (inflammation) of specific areas of the digestive tract (uncomplicated diverticulosis). ? A problem of the large intestine (colon) that sometimes causes pain and diarrhea (irritable bowel syndrome, IBS).  Prevent overeating as part of a weight-loss plan.  Prevent heart disease, type 2 diabetes, and certain cancers. What is my plan? The recommended daily fiber intake in grams (g) includes:  38 g for men age 37 or younger.  30 g for men over age 71.  25 g for women age 76 or younger.  21 g for women over age 47. You can get the recommended daily intake of dietary fiber by:  Eating a variety of fruits, vegetables, grains, and beans.  Taking a fiber supplement, if it is not possible to get enough fiber through your diet. What do I need to know about a high-fiber diet?  It is better to get fiber through food sources rather than from fiber supplements. There is not a lot of research about how effective supplements are.  Always check the fiber content on the nutrition facts label of any prepackaged food. Look for foods that contain 5 g of fiber or more per serving.  Talk with a diet and nutrition specialist (dietitian) if you have questions about specific foods that are recommended or not recommended for your medical condition, especially if those foods are not listed below.  Gradually  increase how much fiber you consume. If you increase your intake of dietary fiber too quickly, you may have bloating, cramping, or gas.  Drink plenty of water. Water helps you to digest fiber. What are tips for following this plan?  Eat a wide variety of high-fiber foods.  Make sure that half of the grains that you eat each day are whole grains.  Eat breads and cereals that are made with whole-grain flour instead of refined flour or white flour.  Eat brown rice, bulgur wheat, or millet instead of white rice.  Start the day with a breakfast that is high in fiber, such as a cereal that contains 5 g of fiber or more per serving.  Use beans in place of meat in soups, salads, and pasta dishes.  Eat high-fiber snacks, such as berries, raw vegetables, nuts, and popcorn.  Choose whole fruits and vegetables instead of processed forms like juice or sauce. What foods can I eat?  Fruits Berries. Pears. Apples. Oranges. Avocado. Prunes and raisins. Dried figs. Vegetables Sweet potatoes. Spinach. Kale. Artichokes. Cabbage. Broccoli. Cauliflower. Green peas. Carrots. Squash. Grains Whole-grain breads. Multigrain cereal. Oats and oatmeal. Brown rice. Barley. Bulgur wheat. Millet. Quinoa. Bran muffins. Popcorn. Rye wafer crackers. Meats and other proteins Navy, kidney, and pinto beans. Soybeans. Split peas. Lentils. Nuts and  seeds. Dairy Fiber-fortified yogurt. Beverages Fiber-fortified soy milk. Fiber-fortified orange juice. Other foods Fiber bars. The items listed above may not be a complete list of recommended foods and beverages. Contact a dietitian for more options. What foods are not recommended? Fruits Fruit juice. Cooked, strained fruit. Vegetables Fried potatoes. Canned vegetables. Well-cooked vegetables. Grains White bread. Pasta made with refined flour. White rice. Meats and other proteins Fatty cuts of meat. Fried chicken or fried fish. Dairy Milk. Yogurt. Cream cheese. Sour  cream. Fats and oils Butters. Beverages Soft drinks. Other foods Cakes and pastries. The items listed above may not be a complete list of foods and beverages to avoid. Contact a dietitian for more information. Summary  Fiber is a type of carbohydrate. It is found in fruits, vegetables, whole grains, and beans.  There are many health benefits of eating a high-fiber diet, such as preventing constipation, lowering blood cholesterol, helping with weight loss, and reducing your risk of heart disease, diabetes, and certain cancers.  Gradually increase your intake of fiber. Increasing too fast can result in cramping, bloating, and gas. Drink plenty of water while you increase your fiber.  The best sources of fiber include whole fruits and vegetables, whole grains, nuts, seeds, and beans. This information is not intended to replace advice given to you by your health care provider. Make sure you discuss any questions you have with your health care provider. Document Revised: 11/26/2016 Document Reviewed: 11/26/2016 Elsevier Patient Education  2020 [employer].

## 2019-11-03 DIAGNOSIS — M545 Low back pain: Secondary | ICD-10-CM | POA: Diagnosis not present

## 2019-11-09 DIAGNOSIS — Z20828 Contact with and (suspected) exposure to other viral communicable diseases: Secondary | ICD-10-CM | POA: Diagnosis not present

## 2019-11-16 DIAGNOSIS — M545 Low back pain, unspecified: Secondary | ICD-10-CM | POA: Diagnosis not present

## 2019-11-19 DIAGNOSIS — M545 Low back pain, unspecified: Secondary | ICD-10-CM | POA: Diagnosis not present

## 2019-11-23 DIAGNOSIS — M48061 Spinal stenosis, lumbar region without neurogenic claudication: Secondary | ICD-10-CM | POA: Diagnosis not present

## 2019-12-16 DIAGNOSIS — M25561 Pain in right knee: Secondary | ICD-10-CM | POA: Diagnosis not present

## 2019-12-16 DIAGNOSIS — M5416 Radiculopathy, lumbar region: Secondary | ICD-10-CM | POA: Diagnosis not present

## 2020-02-27 ENCOUNTER — Encounter: Payer: Self-pay | Admitting: Physician Assistant

## 2020-02-27 ENCOUNTER — Telehealth: Payer: BC Managed Care – PPO | Admitting: Physician Assistant

## 2020-02-27 DIAGNOSIS — R509 Fever, unspecified: Secondary | ICD-10-CM

## 2020-02-27 DIAGNOSIS — J029 Acute pharyngitis, unspecified: Secondary | ICD-10-CM

## 2020-02-27 DIAGNOSIS — J0110 Acute frontal sinusitis, unspecified: Secondary | ICD-10-CM

## 2020-02-27 MED ORDER — AZITHROMYCIN 250 MG PO TABS: ORAL_TABLET | ORAL | 0 refills | Status: DC

## 2020-02-27 NOTE — Progress Notes (Signed)
I connected with  Regis Bill on 02/27/20 by a video enabled telemedicine application and verified that I am speaking with the correct person using two identifiers.   I discussed the limitations of evaluation and management by telemedicine. The patient expressed understanding and agreed to proceed.    Acute Office Visit  Subjective:    Patient ID: Jesse Baker, male    DOB: October 22, 1965, 55 y.o.   MRN: [id number]  Chief Complaint  Patient presents with  . Covid Exposure  Virtual Visit via Video Note  I connected with SENAY SISTRUNK on 02/27/20 at 10:30 AM EST by a video enabled telemedicine application and verified that I am speaking with the correct person using two identifiers.  Video connection was interrupted and the remainder of the visit was completed via telephone.  Location: Patient: Home Provider: Working remotely from home   I discussed the limitations of evaluation and management by telemedicine and the availability of in person appointments. The patient expressed understanding and agreed to proceed.  History of Present Illness: Patient states that he has not been feeling well since Tuesday February 23, 2020.  Reports he started having a sore throat.  Reports the next day his symptoms worsened, continued sore throat, fatigue, sinus pressure, nasal congestion with yellow discharge, body aches.  Reports some cough with a small amount of shortness of breath.  Reports yesterday he started feeling very hot, measured temperature of 100.4.  Reports a few episodes of diarrhea.  Has a low appetite but is eating and drinking okay, states he is staying well-hydrated.  Does endorse history of sinus infections.  Reports that he has been using Tylenol, Claritin and Flonase with a little relief.  Reports exposure on February 19, 2020 to coworker who is positive for COVID.  Reports that he took a home COVID test on Wednesday, and went again on Thursday, both were negative.  Reports that he  did have a PCR test completed at [facility] yesterday afternoon, currently awaiting results.  Reports 3 Pfizer COVID vaccines with his booster in November 2021, flu shot November 2021   Observations/Objective: Medical history and current medications reviewed, no physical exam completed     Past Medical History:  Diagnosis Date  . Allergy   . Hyperlipidemia   . Hypertension   . Seasonal allergies     Past Surgical History:  Procedure Laterality Date  . BACK SURGERY     2012, lumbar discectomy     Family History  Problem Relation Age of Onset  . Hypertension Mother   . Heart attack Paternal Grandmother   . Cancer Paternal Grandfather        possible prostate cancer  . Stroke Neg Hx     Social History   Socioeconomic History  . Marital status: Married    Spouse name: Not on file  . Number of children: Not on file  . Years of education: Not on file  . Highest education level: Not on file  Occupational History  . Not on file  Tobacco Use  . Smoking status: Former Games developer  . Smokeless tobacco: Never Used  Vaping Use  . Vaping Use: Never used  Substance and Sexual Activity  . Alcohol use: Yes  . Drug use: No  . Sexual activity: Yes  Other Topics Concern  . Not on file  Social History Narrative  . Not on file   Social Determinants of Health   Financial Resource Strain: Not on file  Food Insecurity: Not  on file  Transportation Needs: Not on file  Physical Activity: Not on file  Stress: Not on file  Social Connections: Not on file  Intimate Partner Violence: Not on file    Outpatient Medications Prior to Visit  Medication Sig Dispense Refill  . amLODipine (NORVASC) 10 MG tablet Take 1 tablet (10 mg total) by mouth daily. 90 tablet 3  . amoxicillin (AMOXIL) 500 MG capsule Take by mouth as directed.    Marland Kitchen aspirin EC 81 MG tablet Take 81 mg by mouth daily.    Marland Kitchen atorvastatin (LIPITOR) 10 MG tablet Take 1 tablet (10 mg total) by mouth daily. 90 tablet 3  .  chlorhexidine (PERIDEX) 0.12 % solution as directed.    . diclofenac (VOLTAREN) 75 MG EC tablet Take 75 mg by mouth in the morning and at bedtime.    . fluticasone (FLONASE) 50 MCG/ACT nasal spray Place 2 sprays into both nostrils daily.    Marland Kitchen loratadine (CLARITIN) 10 MG tablet Take 10 mg by mouth daily.    Marland Kitchen losartan (COZAAR) 100 MG tablet Take 1 tablet (100 mg total) by mouth daily. 90 tablet 3  . methocarbamol (ROBAXIN) 500 MG tablet daily.    . metoprolol succinate (TOPROL-XL) 100 MG 24 hr tablet Take 1 tablet (100 mg total) by mouth daily. 90 tablet 3  . sildenafil (REVATIO) 20 MG tablet Take 3-5 tablets 1 hour prior to sexual activity 30 tablet 11   No facility-administered medications prior to visit.    No Known Allergies  Review of Systems  Constitutional: Positive for fatigue and fever.  HENT: Positive for congestion, sinus pressure, sore throat and trouble swallowing.   Eyes: Negative.   Respiratory: Positive for cough and shortness of breath. Negative for wheezing.   Cardiovascular: Negative for chest pain.  Gastrointestinal: Positive for diarrhea. Negative for abdominal pain, nausea and vomiting.  Endocrine: Negative.   Genitourinary: Negative.   Musculoskeletal: Positive for myalgias.  Skin: Negative.   Allergic/Immunologic: Negative.   Neurological: Negative.   Hematological: Negative.   Psychiatric/Behavioral: Negative.        Objective:     There were no vitals taken for this visit. Wt Readings from Last 3 Encounters:  10/06/19 243 lb (110.2 kg)  07/23/19 246 lb (111.6 kg)  07/03/19 245 lb (111.1 kg)    Health Maintenance Due  Topic Date Due  . Hepatitis C Screening  Never done  . COVID-19 Vaccine (1) Never done  . HIV Screening  Never done  . COLONOSCOPY (Pts 45-65yrs Insurance coverage will need to be confirmed)  Never done  . INFLUENZA VACCINE  09/06/2019    There are no preventive care reminders to display for this patient.   Lab Results   Component Value Date   TSH 4.560 (H) 07/23/2019   Lab Results  Component Value Date   WBC 9.8 06/30/2019   HGB 14.0 06/30/2019   HCT 41.4 06/30/2019   MCV 101.7 (H) 06/30/2019   PLT 203 06/30/2019   Lab Results  Component Value Date   NA 134 (L) 06/30/2019   K 3.7 06/30/2019   CO2 20 (L) 06/30/2019   GLUCOSE 178 (H) 06/30/2019   BUN 25 (H) 06/30/2019   CREATININE 1.20 06/30/2019   BILITOT 3.3 (H) 10/29/2013   ALKPHOS 96 10/29/2013   AST 46 (H) 10/29/2013   ALT 49 10/29/2013   PROT 7.8 10/29/2013   ALBUMIN 4.4 10/29/2013   CALCIUM 9.1 06/30/2019   ANIONGAP 12 06/30/2019   Lab Results  Component Value Date   CHOL 139 07/23/2019   Lab Results  Component Value Date   HDL 55 07/23/2019   Lab Results  Component Value Date   LDLCALC 60 07/23/2019   Lab Results  Component Value Date   TRIG 139 07/23/2019   Lab Results  Component Value Date   CHOLHDL 2.5 07/23/2019   Lab Results  Component Value Date   HGBA1C 5.1 07/23/2019       Assessment & Plan:   Problem List Items Addressed This Visit   None   Visit Diagnoses    Acute frontal sinusitis, recurrence not specified    -  Primary   Relevant Medications   azithromycin (ZITHROMAX) 250 MG tablet   Fever, unspecified       Sore throat          Assessment and Plan:  1. Acute frontal sinusitis, recurrence not specified Continue symptomatic treatment, trial ibuprofen over-the-counter, trial azithromycin.  Encouraged follow-up with primary care provider or with any other ED visit if no improvement, or if PCR testing does return positive for COVID.  Red flags given for prompt reevaluation.  Encouraged to self quarantine until fever resolves and/or Covid testing returns.  - azithromycin (ZITHROMAX) 250 MG tablet; Take 2 tabs PO day 1 one ,  Dispense: 6 tablet; Refill: 0  2. Fever, unspecified   3. Sore throat    Follow Up Instructions:    I discussed the assessment and treatment plan with the  patient. The patient was provided an opportunity to ask questions and all were answered. The patient agreed with the plan and demonstrated an understanding of the instructions.   The patient was advised to call back or seek an in-person evaluation if the symptoms worsen or if the condition fails to improve as anticipated.  I provided 21 minutes of face-to-face and  non-face-to-face time during this encounter.     Meds ordered this encounter  Medications  . azithromycin (ZITHROMAX) 250 MG tablet    Sig: Take 2 tabs PO day 1 one ,    Dispense:  6 tablet    Refill:  0    Order Specific Question:   Supervising Provider    Answer:   Shan Levans E [[id number]]     Chrles Selley S Merina Behrendt, PA-C

## 2020-02-27 NOTE — Patient Instructions (Signed)
I encourage you to continue using your Claritin, I encourage you to increase Flonase to twice daily until your symptoms improve, I sent azithromycin to your pharmacy, I encourage you to also try ibuprofen for relief.  Continue to stay well-hydrated.  I do encourage you to maintain a self quarantine until your COVID testing results return.  Although you have had 2 vaccines and a booster dose, it is still possible for you to test positive.  The good news is that even if you are positive, because you have had the COVID vaccines and booster shot, your symptoms should resolve quickly and you should feel better very soon.  I hope that you feel better soon, please let us know if there is anything else we can do for you  Roney Jaffe, PA-C Physician Assistant [facility] [email or web address]   Sinusitis, Adult Sinusitis is inflammation of your sinuses. Sinuses are hollow spaces in the bones around your face. Your sinuses are located:  Around your eyes.  In the middle of your forehead.  Behind your nose.  In your cheekbones. Mucus normally drains out of your sinuses. When your nasal tissues become inflamed or swollen, mucus can become trapped or blocked. This allows bacteria, viruses, and fungi to grow, which leads to infection. Most infections of the sinuses are caused by a virus. Sinusitis can develop quickly. It can last for up to 4 weeks (acute) or for more than 12 weeks (chronic). Sinusitis often develops after a cold. What are the causes? This condition is caused by anything that creates swelling in the sinuses or stops mucus from draining. This includes:  Allergies.  Asthma.  Infection from bacteria or viruses.  Deformities or blockages in your nose or sinuses.  Abnormal growths in the nose (nasal polyps).  Pollutants, such as chemicals or irritants in the air.  Infection from fungi (rare). What increases the risk? You are  more likely to develop this condition if you:  Have a weak body defense system (immune system).  Do a lot of swimming or diving.  Overuse nasal sprays.  Smoke. What are the signs or symptoms? The main symptoms of this condition are pain and a feeling of pressure around the affected sinuses. Other symptoms include:  Stuffy nose or congestion.  Thick drainage from your nose.  Swelling and warmth over the affected sinuses.  Headache.  Upper toothache.  A cough that may get worse at night.  Extra mucus that collects in the throat or the back of the nose (postnasal drip).  Decreased sense of smell and taste.  Fatigue.  A fever.  Sore throat.  Bad breath. How is this diagnosed? This condition is diagnosed based on:  Your symptoms.  Your medical history.  A physical exam.  Tests to find out if your condition is acute or chronic. This may include: ? Checking your nose for nasal polyps. ? Viewing your sinuses using a device that has a light (endoscope). ? Testing for allergies or bacteria. ? Imaging tests, such as an MRI or CT scan. In rare cases, a bone biopsy may be done to rule out more serious types of fungal sinus disease. How is this treated? Treatment for sinusitis depends on the cause and whether your condition is chronic or acute.  If caused by a virus, your symptoms should go away on their own within 10 days. You may be given medicines to relieve symptoms. They include: ? Medicines that shrink swollen nasal passages (topical intranasal decongestants). ? Medicines  that treat allergies (antihistamines). ? A spray that eases inflammation of the nostrils (topical intranasal corticosteroids). ? Rinses that help get rid of thick mucus in your nose (nasal saline washes).  If caused by bacteria, your health care provider may recommend waiting to see if your symptoms improve. Most bacterial infections will get better without antibiotic medicine. You may be given  antibiotics if you have: ? A severe infection. ? A weak immune system.  If caused by narrow nasal passages or nasal polyps, you may need to have surgery. Follow these instructions at home: Medicines  Take, use, or apply over-the-counter and prescription medicines only as told by your health care provider. These may include nasal sprays.  If you were prescribed an antibiotic medicine, take it as told by your health care provider. Do not stop taking the antibiotic even if you start to feel better. Hydrate and humidify  Drink enough fluid to keep your urine pale yellow. Staying hydrated will help to thin your mucus.  Use a cool mist humidifier to keep the humidity level in your home above 50%.  Inhale steam for 10-15 minutes, 3-4 times a day, or as told by your health care provider. You can do this in the bathroom while a hot shower is running.  Limit your exposure to cool or dry air.   Rest  Rest as much as possible.  Sleep with your head raised (elevated).  Make sure you get enough sleep each night. General instructions  Apply a warm, moist washcloth to your face 3-4 times a day or as told by your health care provider. This will help with discomfort.  Wash your hands often with soap and water to reduce your exposure to germs. If soap and water are not available, use hand sanitizer.  Do not smoke. Avoid being around people who are smoking (secondhand smoke).  Keep all follow-up visits as told by your health care provider. This is important.   Contact a health care provider if:  You have a fever.  Your symptoms get worse.  Your symptoms do not improve within 10 days. Get help right away if:  You have a severe headache.  You have persistent vomiting.  You have severe pain or swelling around your face or eyes.  You have vision problems.  You develop confusion.  Your neck is stiff.  You have trouble breathing. Summary  Sinusitis is soreness and inflammation of  your sinuses. Sinuses are hollow spaces in the bones around your face.  This condition is caused by nasal tissues that become inflamed or swollen. The swelling traps or blocks the flow of mucus. This allows bacteria, viruses, and fungi to grow, which leads to infection.  If you were prescribed an antibiotic medicine, take it as told by your health care provider. Do not stop taking the antibiotic even if you start to feel better.  Keep all follow-up visits as told by your health care provider. This is important. This information is not intended to replace advice given to you by your health care provider. Make sure you discuss any questions you have with your health care provider. Document Revised: 06/24/2017 Document Reviewed: 06/24/2017 Elsevier Patient Education  2021 [employer].

## 2020-02-28 NOTE — Addendum Note (Signed)
Addended by: Shondale Quinley S on: 02/28/2020 01:39 PM   Modules accepted: Level of Service  

## 2020-06-19 ENCOUNTER — Other Ambulatory Visit: Payer: Self-pay | Admitting: Interventional Cardiology

## 2020-07-11 ENCOUNTER — Emergency Department (HOSPITAL_COMMUNITY): Payer: 59

## 2020-07-11 ENCOUNTER — Encounter (HOSPITAL_COMMUNITY): Payer: Self-pay

## 2020-07-11 ENCOUNTER — Observation Stay (HOSPITAL_COMMUNITY)
Admission: EM | Admit: 2020-07-11 | Discharge: 2020-07-12 | Disposition: A | Discharge status: Home / Self Care | Payer: 59 | Attending: Internal Medicine | Admitting: Internal Medicine

## 2020-07-11 ENCOUNTER — Other Ambulatory Visit: Payer: Self-pay

## 2020-07-11 DIAGNOSIS — Z7982 Long term (current) use of aspirin: Secondary | ICD-10-CM | POA: Insufficient documentation

## 2020-07-11 DIAGNOSIS — G459 Transient cerebral ischemic attack, unspecified: Secondary | ICD-10-CM

## 2020-07-11 DIAGNOSIS — R7401 Elevation of levels of liver transaminase levels: Secondary | ICD-10-CM | POA: Insufficient documentation

## 2020-07-11 DIAGNOSIS — Z87891 Personal history of nicotine dependence: Secondary | ICD-10-CM | POA: Diagnosis not present

## 2020-07-11 DIAGNOSIS — I10 Essential (primary) hypertension: Secondary | ICD-10-CM | POA: Diagnosis not present

## 2020-07-11 DIAGNOSIS — R200 Anesthesia of skin: Secondary | ICD-10-CM | POA: Diagnosis not present

## 2020-07-11 DIAGNOSIS — R7989 Other specified abnormal findings of blood chemistry: Secondary | ICD-10-CM

## 2020-07-11 DIAGNOSIS — Z20822 Contact with and (suspected) exposure to covid-19: Secondary | ICD-10-CM | POA: Insufficient documentation

## 2020-07-11 DIAGNOSIS — F1010 Alcohol abuse, uncomplicated: Secondary | ICD-10-CM

## 2020-07-11 DIAGNOSIS — E782 Mixed hyperlipidemia: Secondary | ICD-10-CM

## 2020-07-11 DIAGNOSIS — F10230 Alcohol dependence with withdrawal, uncomplicated: Secondary | ICD-10-CM | POA: Insufficient documentation

## 2020-07-11 DIAGNOSIS — Z79899 Other long term (current) drug therapy: Secondary | ICD-10-CM | POA: Insufficient documentation

## 2020-07-11 DIAGNOSIS — Y903 Blood alcohol level of 60-79 mg/100 ml: Secondary | ICD-10-CM | POA: Diagnosis not present

## 2020-07-11 DIAGNOSIS — E785 Hyperlipidemia, unspecified: Secondary | ICD-10-CM | POA: Diagnosis present

## 2020-07-11 HISTORY — DX: Transient cerebral ischemic attack, unspecified: G45.9

## 2020-07-11 LAB — I-STAT CHEM 8, ED
BUN: 31 mg/dL — ABNORMAL HIGH (ref 6–20)
Calcium, Ion: 1.13 mmol/L — ABNORMAL LOW (ref 1.15–1.40)
Chloride: 106 mmol/L (ref 98–111)
Creatinine, Ser: 1.2 mg/dL (ref 0.61–1.24)
Glucose, Bld: 166 mg/dL — ABNORMAL HIGH (ref 70–99)
HCT: 46 % (ref 39.0–52.0)
Hemoglobin: 15.6 g/dL (ref 13.0–17.0)
Potassium: 4.2 mmol/L (ref 3.5–5.1)
Sodium: 140 mmol/L (ref 135–145)
TCO2: 22 mmol/L (ref 22–32)

## 2020-07-11 LAB — DIFFERENTIAL
Abs Immature Granulocytes: 0.03 10*3/uL (ref 0.00–0.07)
Basophils Absolute: 0.1 10*3/uL (ref 0.0–0.1)
Basophils Relative: 1 %
Eosinophils Absolute: 0.1 10*3/uL (ref 0.0–0.5)
Eosinophils Relative: 1 %
Immature Granulocytes: 0 %
Lymphocytes Relative: 18 %
Lymphs Abs: 1.8 10*3/uL (ref 0.7–4.0)
Monocytes Absolute: 0.6 10*3/uL (ref 0.1–1.0)
Monocytes Relative: 6 %
Neutro Abs: 7.5 10*3/uL (ref 1.7–7.7)
Neutrophils Relative %: 74 %

## 2020-07-11 LAB — PROTIME-INR
INR: 1.2 (ref 0.8–1.2)
Prothrombin Time: 15.1 seconds (ref 11.4–15.2)

## 2020-07-11 LAB — COMPREHENSIVE METABOLIC PANEL
ALT: 70 U/L — ABNORMAL HIGH (ref 0–44)
AST: 109 U/L — ABNORMAL HIGH (ref 15–41)
Albumin: 4.3 g/dL (ref 3.5–5.0)
Alkaline Phosphatase: 121 U/L (ref 38–126)
Anion gap: 11 (ref 5–15)
BUN: 33 mg/dL — ABNORMAL HIGH (ref 6–20)
CO2: 23 mmol/L (ref 22–32)
Calcium: 9 mg/dL (ref 8.9–10.3)
Chloride: 104 mmol/L (ref 98–111)
Creatinine, Ser: 1.19 mg/dL (ref 0.61–1.24)
GFR, Estimated: >60 mL/min (ref >60)
Glucose, Bld: 169 mg/dL — ABNORMAL HIGH (ref 70–99)
Potassium: 4.1 mmol/L (ref 3.5–5.1)
Sodium: 138 mmol/L (ref 135–145)
Total Bilirubin: 1.4 mg/dL — ABNORMAL HIGH (ref 0.3–1.2)
Total Protein: 8 g/dL (ref 6.5–8.1)

## 2020-07-11 LAB — RAPID URINE DRUG SCREEN, HOSP PERFORMED
Amphetamines: NOT DETECTED
Barbiturates: NOT DETECTED
Benzodiazepines: NOT DETECTED
Cocaine: NOT DETECTED
Opiates: NOT DETECTED
Tetrahydrocannabinol: NOT DETECTED

## 2020-07-11 LAB — PHOSPHORUS: Phosphorus: 3.9 mg/dL (ref 2.5–4.6)

## 2020-07-11 LAB — URINALYSIS, ROUTINE W REFLEX MICROSCOPIC
Bilirubin Urine: NEGATIVE
Glucose, UA: NEGATIVE mg/dL
Hgb urine dipstick: NEGATIVE
Ketones, ur: NEGATIVE mg/dL
Leukocytes,Ua: NEGATIVE
Nitrite: NEGATIVE
Protein, ur: NEGATIVE mg/dL
Specific Gravity, Urine: 1.024 (ref 1.005–1.030)
pH: 5 (ref 5.0–8.0)

## 2020-07-11 LAB — TROPONIN I (HIGH SENSITIVITY)
Troponin I (High Sensitivity): 5 ng/L (ref <18)
Troponin I (High Sensitivity): 5 ng/L (ref <18)

## 2020-07-11 LAB — CBC
HCT: 44.7 % (ref 39.0–52.0)
Hemoglobin: 15.1 g/dL (ref 13.0–17.0)
MCH: 34.2 pg — ABNORMAL HIGH (ref 26.0–34.0)
MCHC: 33.8 g/dL (ref 30.0–36.0)
MCV: 101.1 fL — ABNORMAL HIGH (ref 80.0–100.0)
Platelets: 246 10*3/uL (ref 150–400)
RBC: 4.42 MIL/uL (ref 4.22–5.81)
RDW: 11.4 % — ABNORMAL LOW (ref 11.5–15.5)
WBC: 10 10*3/uL (ref 4.0–10.5)
nRBC: 0 % (ref 0.0–0.2)

## 2020-07-11 LAB — RESP PANEL BY RT-PCR (FLU A&B, COVID) ARPGX2
Influenza A by PCR: NEGATIVE
Influenza B by PCR: NEGATIVE
SARS Coronavirus 2 by RT PCR: NEGATIVE

## 2020-07-11 LAB — MAGNESIUM: Magnesium: 2 mg/dL (ref 1.7–2.4)

## 2020-07-11 LAB — ETHANOL: Alcohol, Ethyl (B): 76 mg/dL — ABNORMAL HIGH (ref <10)

## 2020-07-11 LAB — CBG MONITORING, ED: Glucose-Capillary: 166 mg/dL — ABNORMAL HIGH (ref 70–99)

## 2020-07-11 LAB — APTT: aPTT: 32 seconds (ref 24–36)

## 2020-07-11 LAB — HIV ANTIBODY (ROUTINE TESTING W REFLEX): HIV Screen 4th Generation wRfx: NONREACTIVE

## 2020-07-11 LAB — TSH: TSH: 11.915 u[IU]/mL — ABNORMAL HIGH (ref 0.350–4.500)

## 2020-07-11 IMAGING — CT CT HEAD CODE STROKE
3 series · 15 of 47 positions shown, 18 images · non-contrast
Comparison: [XK]

CLINICAL DATA: Code stroke.  Left arm weakness

EXAM:
CT HEAD WITHOUT CONTRAST
TECHNIQUE: Contiguous axial images were obtained from the base of the skull
through the vertex without intravenous contrast.

[Series 5: head wo · axial · 0.50mm/px · z∈[+1438,+1578]mm · 9 of 34 slices shown, 12 images]
[im 3/34  brain]
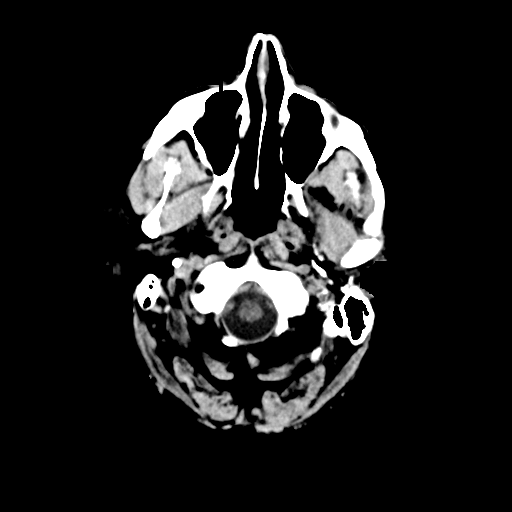
[im 3/34  bone]
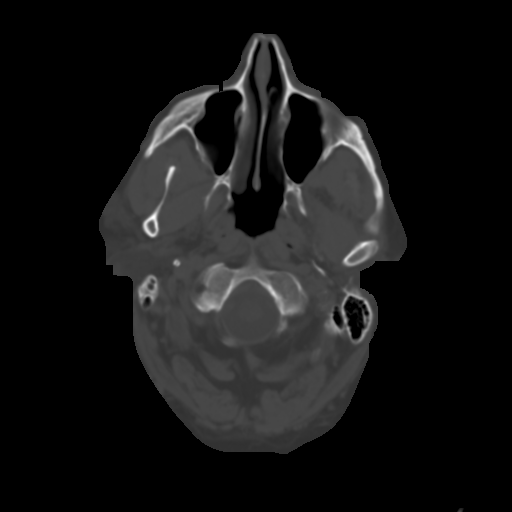
[im 6/34  brain]
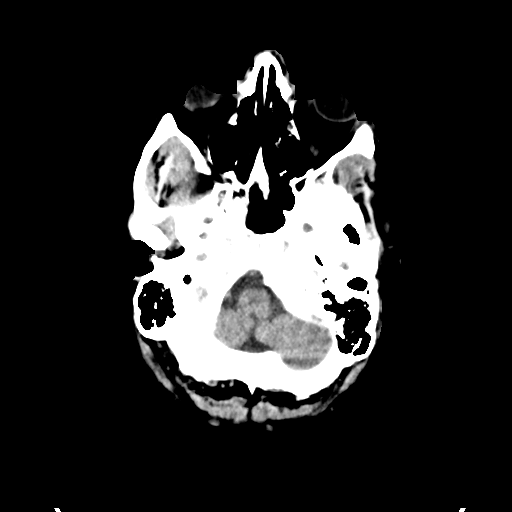
[im 10/34  brain]
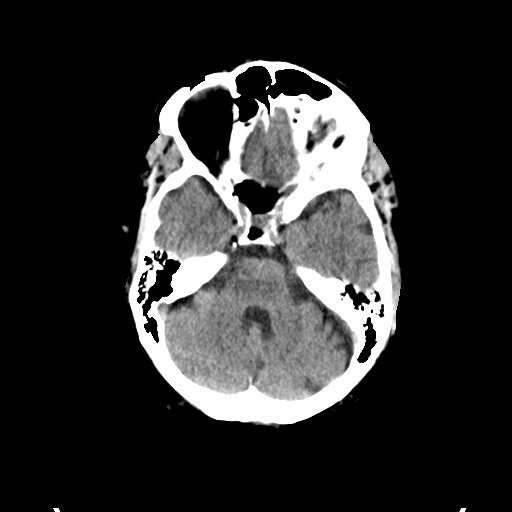
[im 13/34  brain]
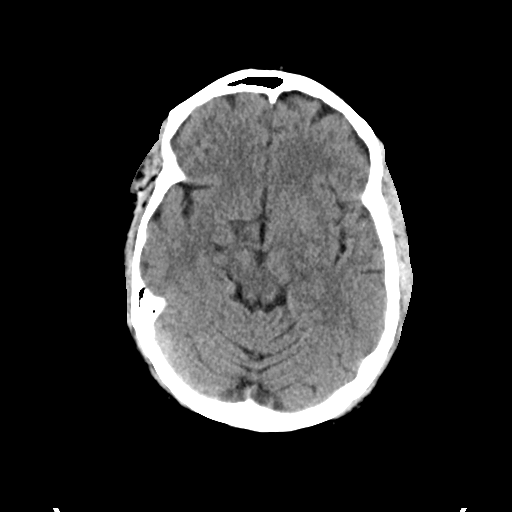
[im 18/34  brain]
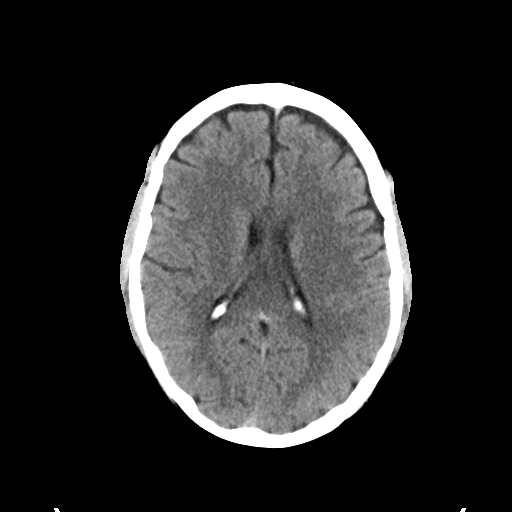
[im 18/34  bone]
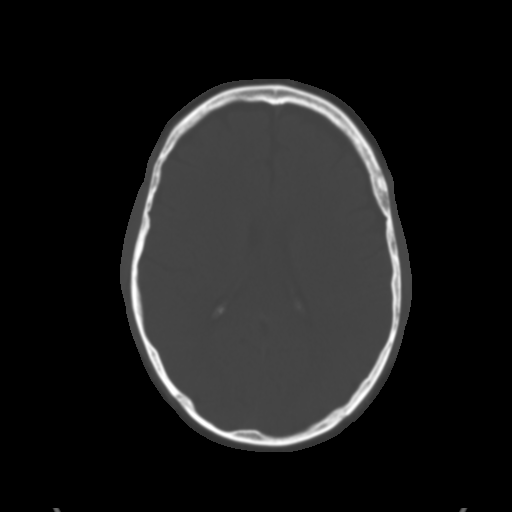
[im 21/34  brain]
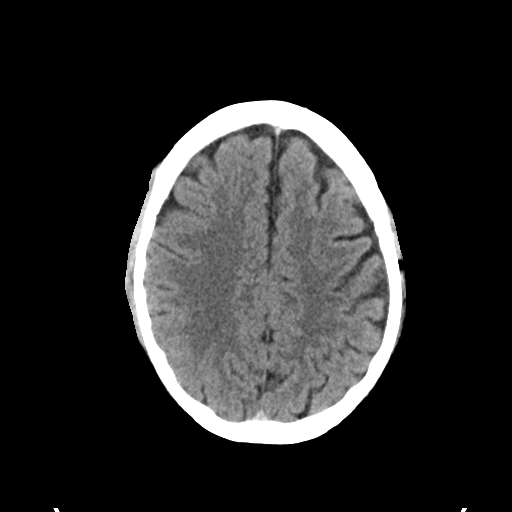
[im 24/34  brain]
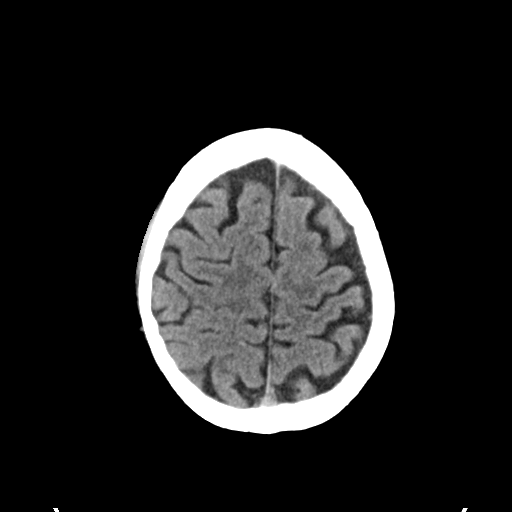
[im 28/34  brain]
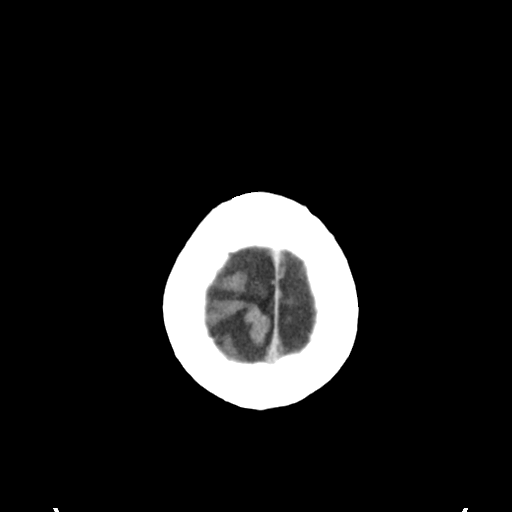
[im 31/34  brain]
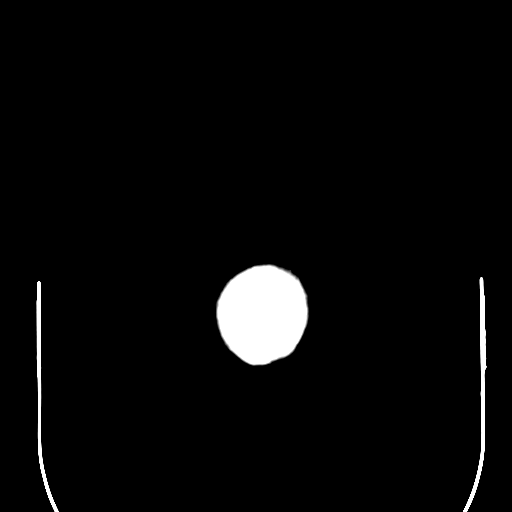
[im 31/34  bone]
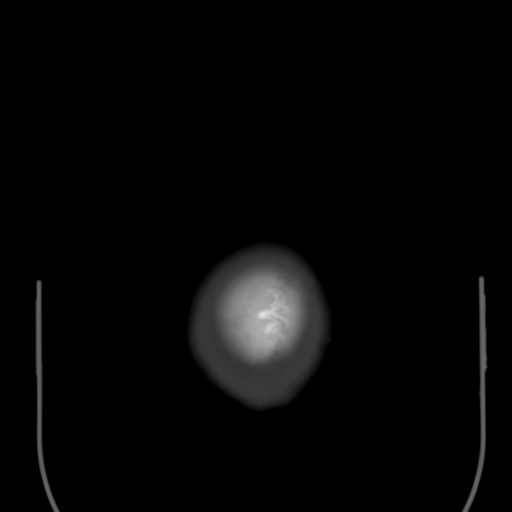

[Series 7: coronal soft tissue · coronal · 0.33mm/px · 3 of 74 slices shown]
[im 25/74  brain]
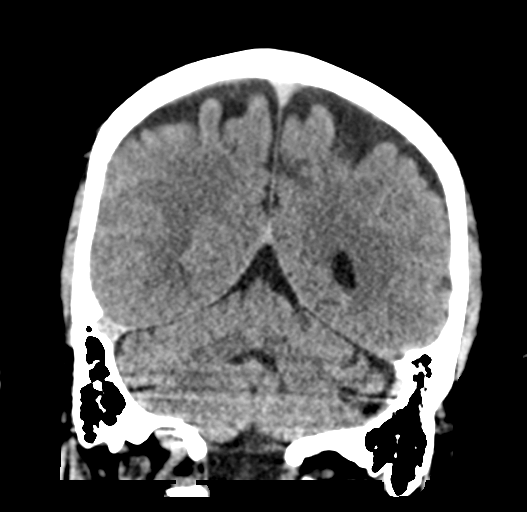
[im 33/74  brain]
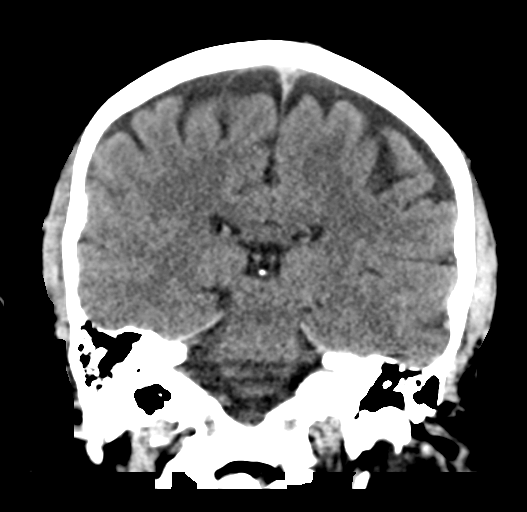
[im 41/74  brain]
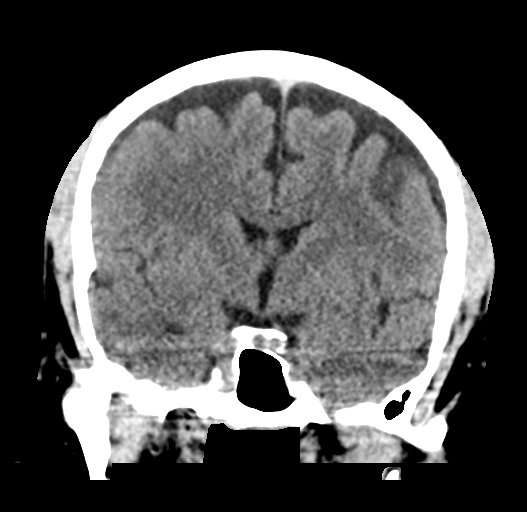

[Series 8: sagittal soft tissue · sagittal · 0.32mm/px · 3 of 65 slices shown]
[im 22/65  brain]
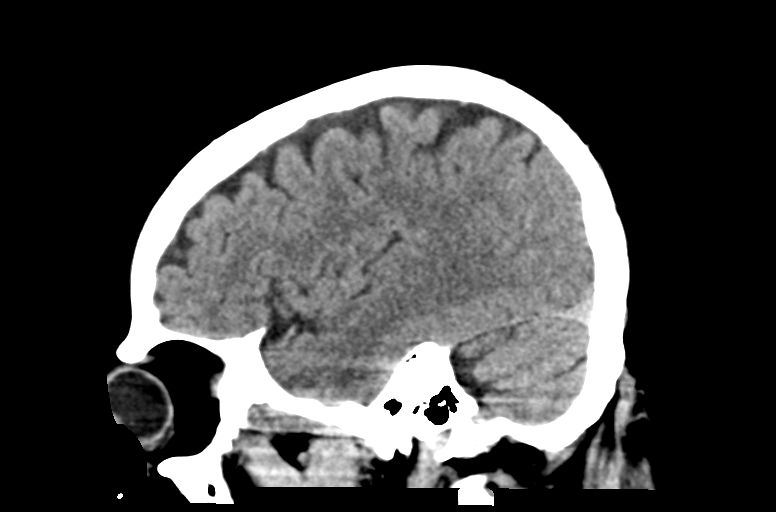
[im 33/65  brain]
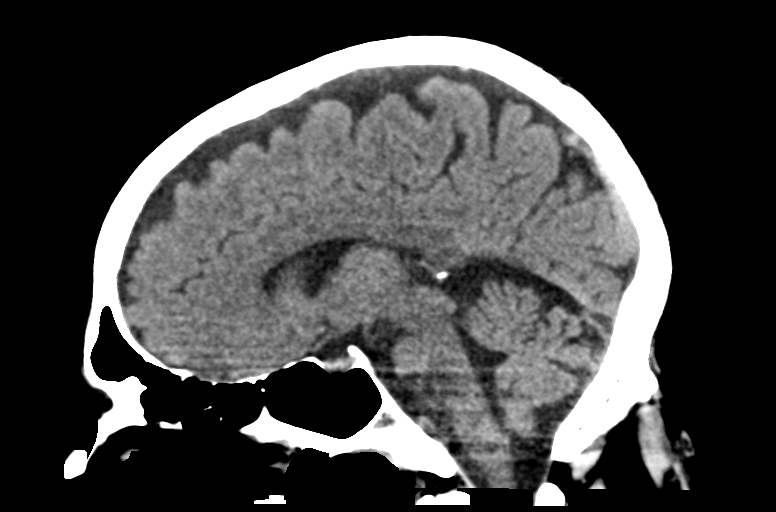
[im 43/65  brain]
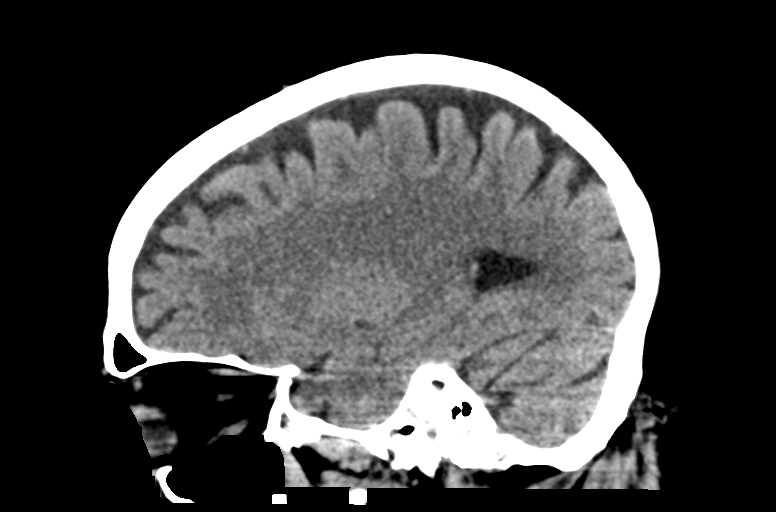

[15 of 47 positions shown; findings below may reference images not displayed]

FINDINGS: Brain: There is no acute intracranial hemorrhage, mass effect, or
edema. Gray-white differentiation is preserved. Ventricles and sulci
are normal in size and configuration. No extra-axial collection.

Vascular: No hyperdense vessel.

Skull: Unremarkable.

Sinuses/Orbits: Maxillary sinus retention cyst. Orbits are
unremarkable.

Other: Mastoid air cells are clear.

ASPECTS (Alberta Stroke Program Early CT Score)

- Ganglionic level infarction (caudate, lentiform nuclei, internal
capsule, insula, M1-M3 cortex): 7

- Supraganglionic infarction (M4-M6 cortex): 3

Total score (0-10 with 10 being normal): 10
IMPRESSION: There is no acute intracranial hemorrhage or evidence of acute
infarction. ASPECT score is 10.

These results were called by telephone at the time of interpretation
on [DATE] at [DATE] to provider UNAPUCHA , who verbally
acknowledged these results.

## 2020-07-11 IMAGING — MR MR MRA NECK WO/W CM
16 of 22 series · 37 of 48 positions shown · IV contrast (10 GADAVIST)
Comparison: Same day CT head.

CLINICAL DATA: Concern for stroke.



[Series 5: DWI · axial · 3.0mm · 1.36mm/px · z∈[-35,+115]mm · 3 of 104 slices shown (1 of 2)]
[im 1/104]
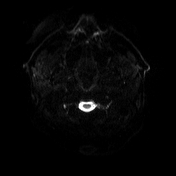
[im 52/104]
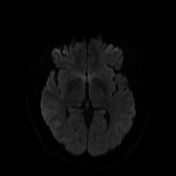
[im 104/104]
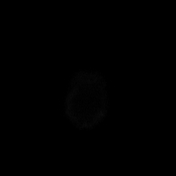

[Series 6: DWI · axial · 3.0mm · 1.36mm/px · 1 of 52 slices shown (2 of 2)]
[im 1/52]
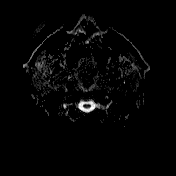

[Series 7: T1 · sagittal · 5.0mm · 0.77mm/px · 1 of 24 slices shown (1 of 2)]
[im 1/24]
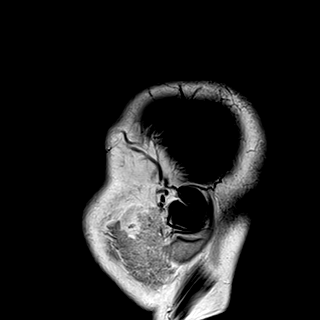

[Series 8: T2 · axial · 5.0mm · 0.64mm/px · 1 of 24 slices shown (1 of 2)]
[im 1/24]
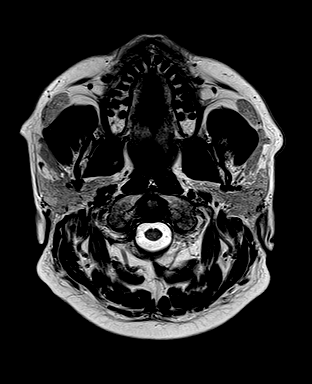

[Series 9: swi_images · axial · 3.0mm · 0.78mm/px · 1 of 52 slices shown]
[im 1/52]
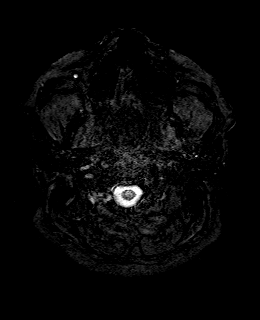

[Series 11: FLAIR · axial · 3.0mm · 0.78mm/px · 1 of 52 slices shown]
[im 1/52]
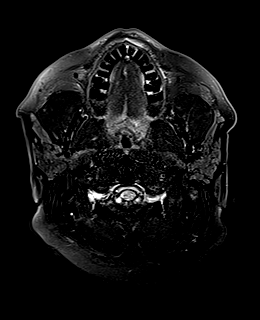

[Series 12: T1 · axial · 1.0mm · 0.98mm/px · z∈[-30,+111]mm · 4 of 144 slices shown (2 of 2)]
[im 1/144]
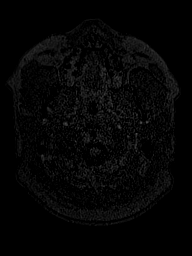
[im 48/144]
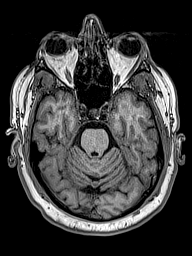
[im 96/144]
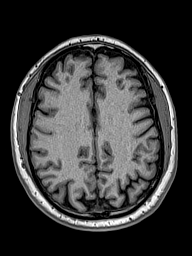
[im 144/144]
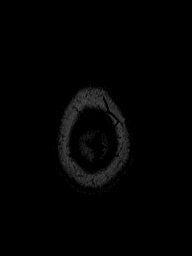

[Series 13: cor dwi_tracew · coronal · 5.0mm · 1.53mm/px · 2 of 48 slices shown]
[im 1/48]
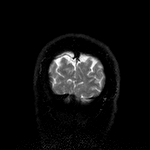
[im 48/48]
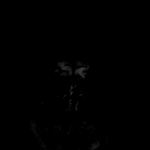

[Series 14: cor dwi_adc · coronal · 5.0mm · 1.53mm/px · 1 of 24 slices shown]
[im 1/24]
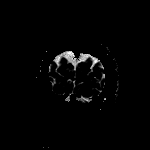

[Series 15: T2 · coronal · 5.0mm · 0.57mm/px · 1 of 28 slices shown (2 of 2)]
[im 1/28]
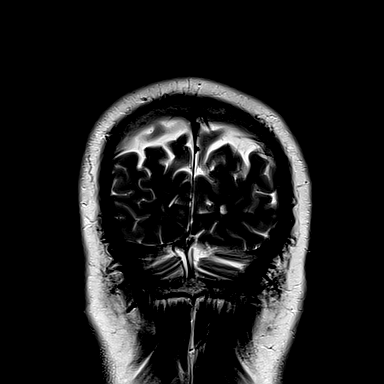

[Series 25: tof_2d_tra · axial · 3.5mm · 0.43mm/px · z∈[-189,-44]mm · 2 of 60 slices shown]
[im 1/60]
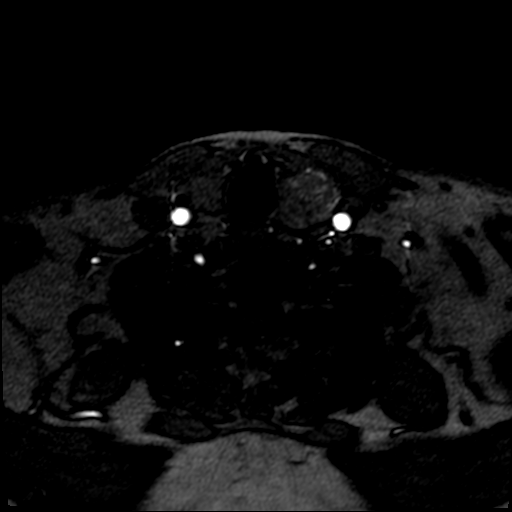
[im 60/60]
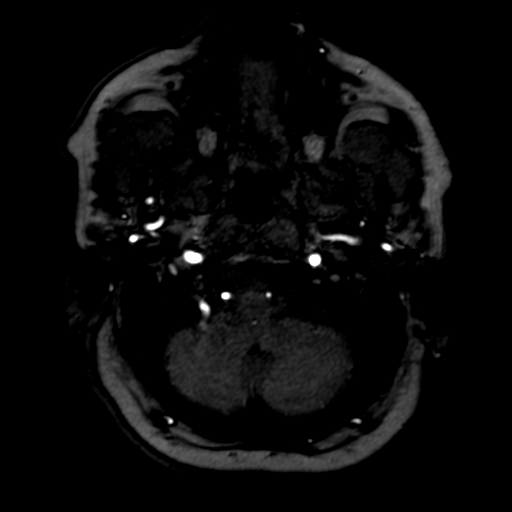

[Series 30: (id)_cor_pre · coronal · 0.8mm · 0.78mm/px · 4 of 96 slices shown]
[im 1/96]
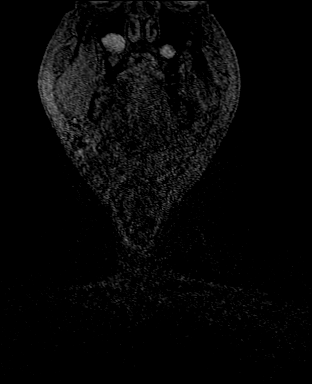
[im 32/96]
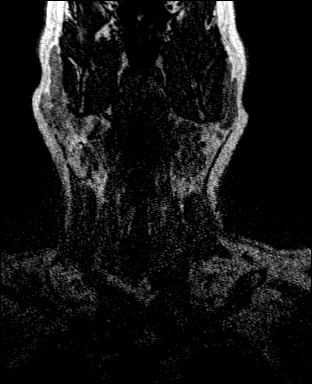
[im 64/96]
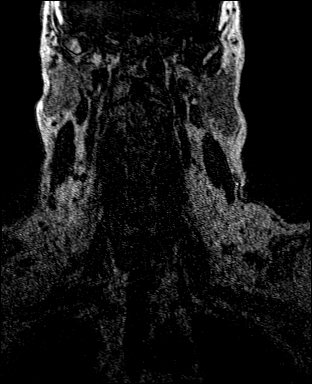
[im 96/96]
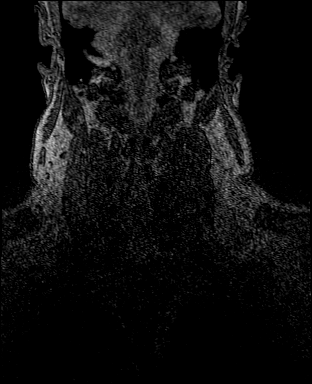

[Series 32: (id)_cor_post · coronal · 0.8mm · 0.78mm/px · 4 of 96 slices shown]
[im 1/96]
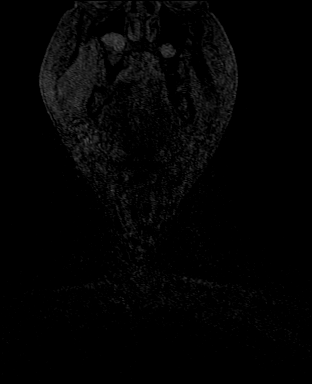
[im 32/96]
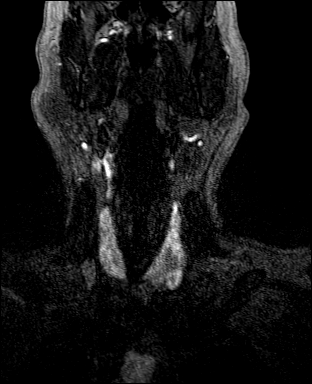
[im 64/96]
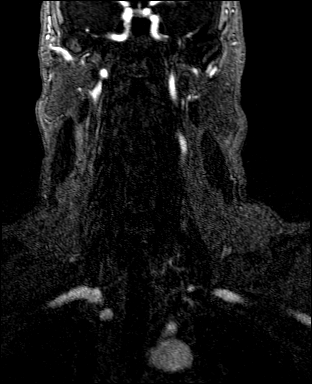
[im 96/96]
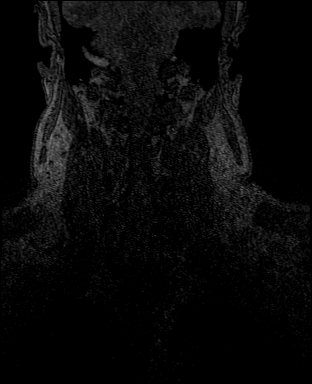

[Series 33: (id)_cor_post_sub · coronal · 0.8mm · 0.78mm/px · 3 of 85 slices shown]
[im 1/85]
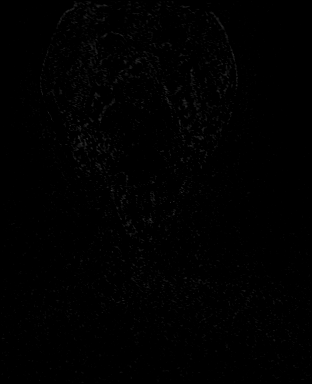
[im 43/85]
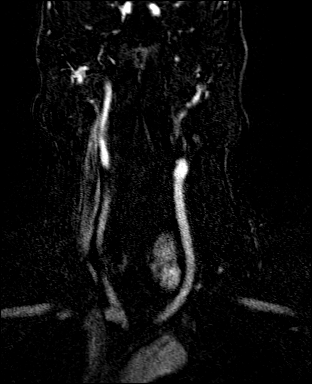
[im 85/85]
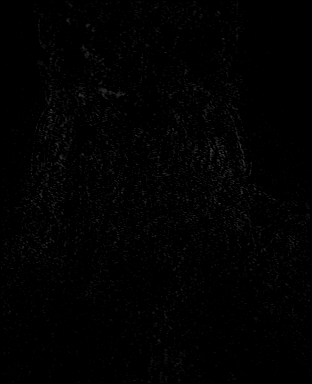

[Series 35: (id)_cor_post_venous · coronal · 0.8mm · 0.78mm/px · 4 of 96 slices shown]
[im 1/96]
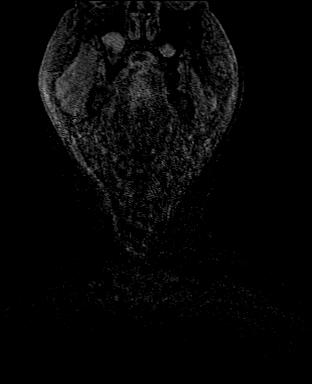
[im 32/96]
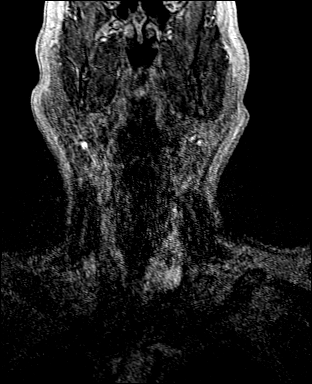
[im 64/96]
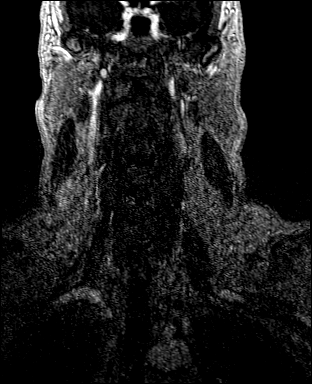
[im 96/96]
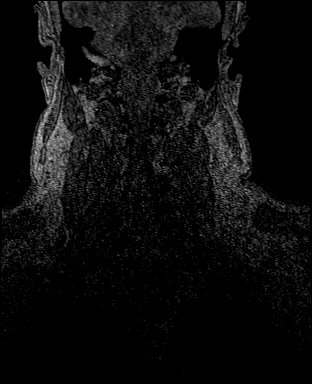

[Series 36: (id)_cor_post_venous_sub · coronal · 0.8mm · 0.78mm/px · 4 of 96 slices shown]
[im 1/96]
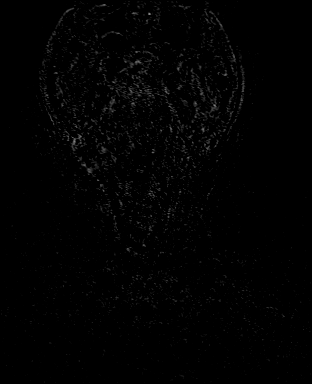
[im 32/96]
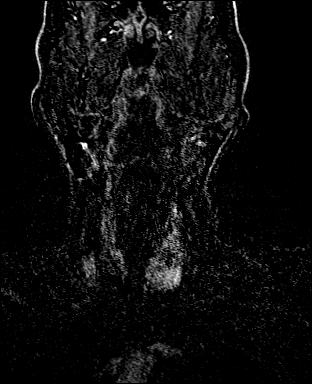
[im 64/96]
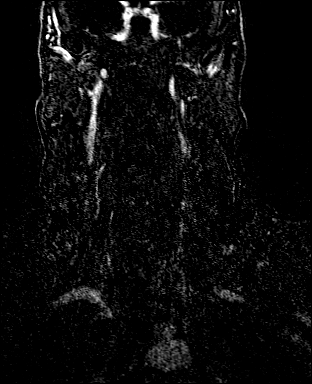
[im 96/96]
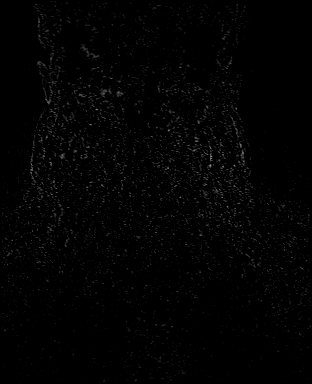

[37 of 48 positions shown; findings below may reference images not displayed]

FINDINGS: MRI HEAD FINDINGS

Brain: No acute infarction, acute hemorrhage, hydrocephalus,
extra-axial collection or mass lesion. No pathologic intracranial
enhancement. Focus of susceptibility artifact (approximately 7 mm)
in the right parietal lobe without edema. There is suggestion of
possible draining vein in this region and slight T2 hypointensity,
suggesting this may represent a cavernous malformation. No correlate
hyperdensity on the same day head CT.

Vascular: See below.

Skull and upper cervical spine: Normal marrow signal.

Sinuses/Orbits: Retention cysts and bilateral maxillary sinuses and
the right sphenoid sinus. No acute orbital abnormality.

Other: No mastoid effusions.

MRA HEAD FINDINGS

Motion limited evaluation.  Within this limitation:

Anterior circulation: No large vessel occlusion or proximal
hemodynamically significant stenosis. Small left posterior
communicating artery infundibulum without visible aneurysm.

Posterior circulation: Patent bilateral intradural vertebral
arteries with small left vertebral artery and basilar artery.
Bilateral posterior communicating arteries with fetal type left PCA,
anatomic variant. Small, but patent bilateral posterior cerebral
arteries proximally. Distal PCA evaluation is limited due to small
size and motion.

MRA NECK FINDINGS

Motion limited evaluation.  Within this limitation:

Aortic arch: Great vessel origins are patent.

Right carotid system: No visible hemodynamically significant
(greater than 50%) stenosis.

Left carotid system: No visible hemodynamically significant (greater
than 50%) stenosis.

Vertebral arteries: Right dominant. Patent right vertebral artery
without visible hemodynamically significant (greater than 50%)
stenosis. The left vertebral artery is small throughout its course,
limiting evaluation (particularly at its origin).

Other: Heterogeneous thyroid with suspected left thyroid nodule.
IMPRESSION: MRI:

1. No evidence of acute intracranial abnormality. Specifically, no
acute infarct.
2. Possible 7 mm cerebral cavernous venous malformation in the right
parietal lobe (versus prior hemorrhage) without evidence of edema or
acute hemorrhage.

MRA head:

1. Motion limited evaluation without large vessel occlusion or
visible proximal hemodynamically significant stenosis.
2. Heterogeneous thyroid with suspected left thyroid nodule.
Consider nonurgent thyroid ultrasound to further characterize.

## 2020-07-11 IMAGING — MR MR MRA HEAD W/O CM
16 of 22 series · 37 of 48 positions shown · IV contrast (gadavist)
Comparison: Same day CT head.

CLINICAL DATA: Concern for stroke.



[Series 5: DWI · axial · 3.0mm · 1.36mm/px · z∈[-35,+115]mm · 3 of 104 slices shown (1 of 2)]
[im 1/104]
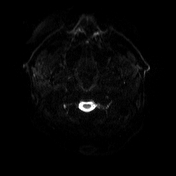
[im 52/104]
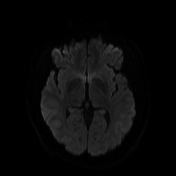
[im 104/104]
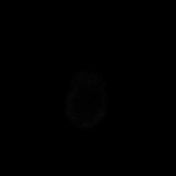

[Series 6: DWI · axial · 3.0mm · 1.36mm/px · 1 of 52 slices shown (2 of 2)]
[im 1/52]
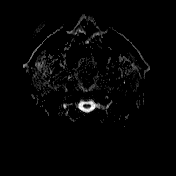

[Series 7: T1 · sagittal · 5.0mm · 0.77mm/px · 1 of 24 slices shown (1 of 2)]
[im 1/24]
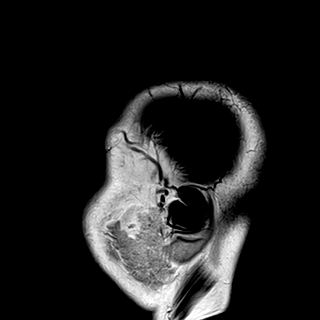

[Series 8: T2 · axial · 5.0mm · 0.64mm/px · 1 of 24 slices shown (1 of 2)]
[im 1/24]
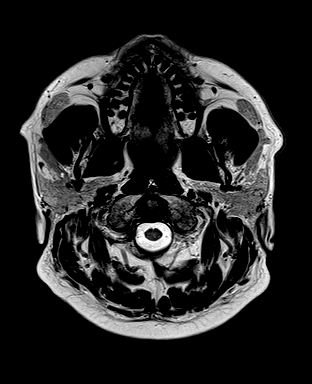

[Series 9: swi_images · axial · 3.0mm · 0.78mm/px · 1 of 52 slices shown]
[im 1/52]
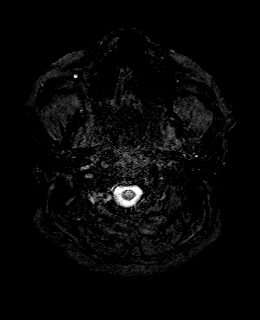

[Series 11: FLAIR · axial · 3.0mm · 0.78mm/px · 1 of 52 slices shown]
[im 1/52]
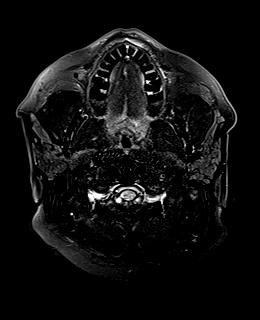

[Series 12: T1 · axial · 1.0mm · 0.98mm/px · z∈[-30,+111]mm · 4 of 144 slices shown (2 of 2)]
[im 1/144]
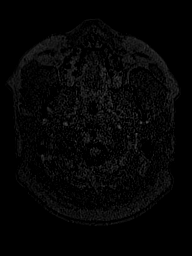
[im 48/144]
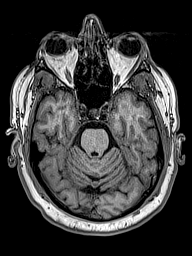
[im 96/144]
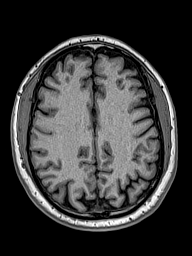
[im 144/144]
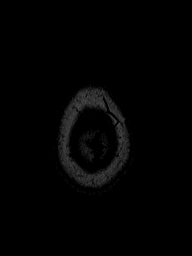

[Series 13: cor dwi_tracew · coronal · 5.0mm · 1.53mm/px · 2 of 48 slices shown]
[im 1/48]
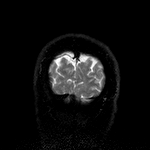
[im 48/48]
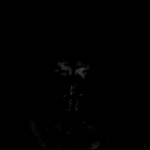

[Series 14: cor dwi_adc · coronal · 5.0mm · 1.53mm/px · 1 of 24 slices shown]
[im 1/24]
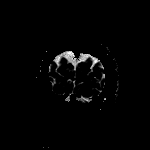

[Series 15: T2 · coronal · 5.0mm · 0.57mm/px · 1 of 28 slices shown (2 of 2)]
[im 1/28]
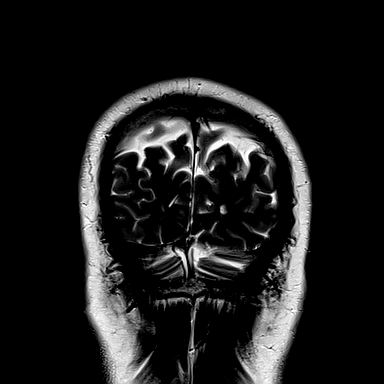

[Series 25: tof_2d_tra · axial · 3.5mm · 0.43mm/px · z∈[-189,-44]mm · 2 of 60 slices shown]
[im 1/60]
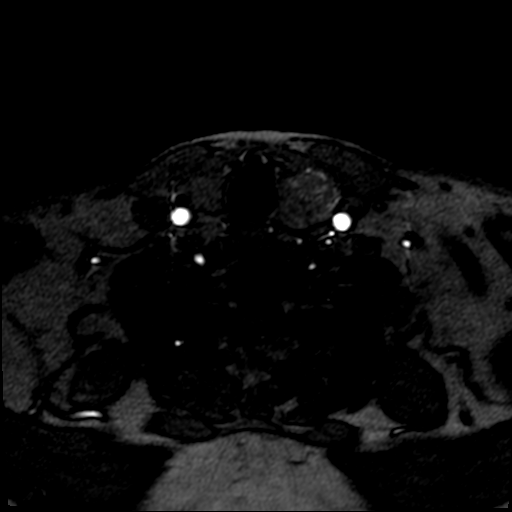
[im 60/60]
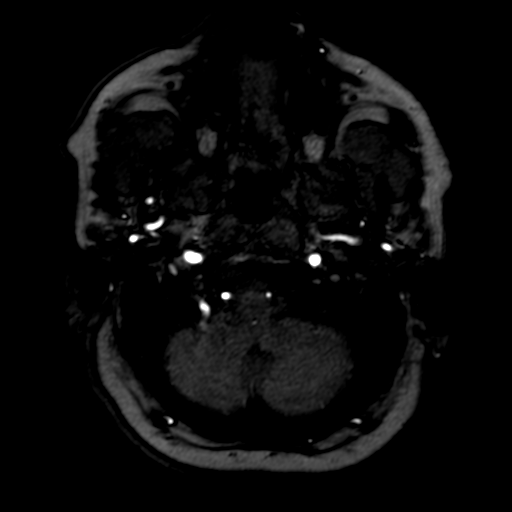

[Series 30: (id)_cor_pre · coronal · 0.8mm · 0.78mm/px · 4 of 96 slices shown]
[im 1/96]
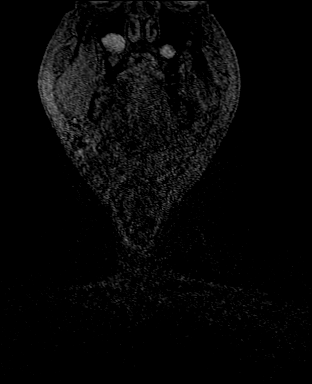
[im 32/96]
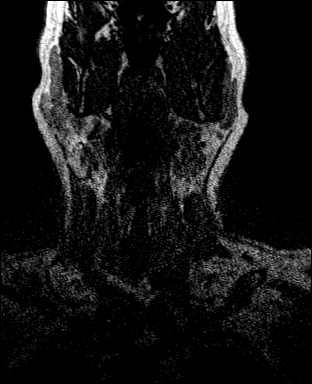
[im 64/96]
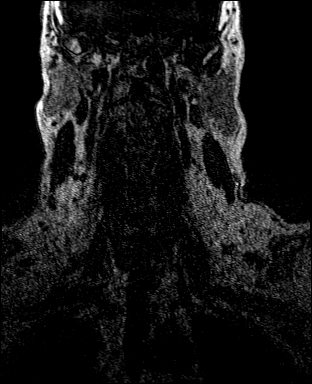
[im 96/96]
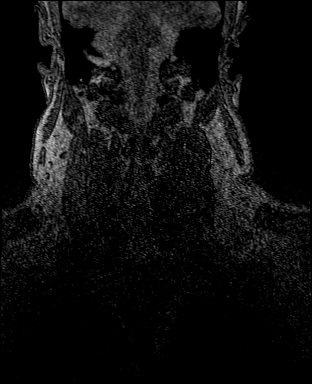

[Series 32: (id)_cor_post · coronal · 0.8mm · 0.78mm/px · 4 of 96 slices shown]
[im 1/96]
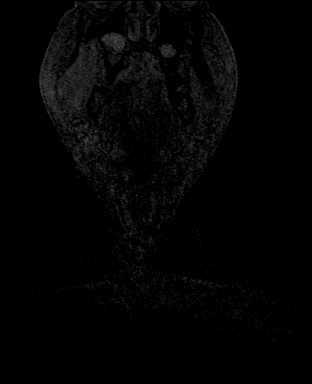
[im 32/96]
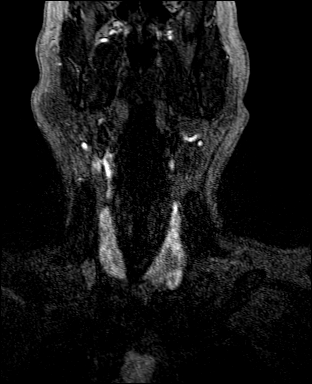
[im 64/96]
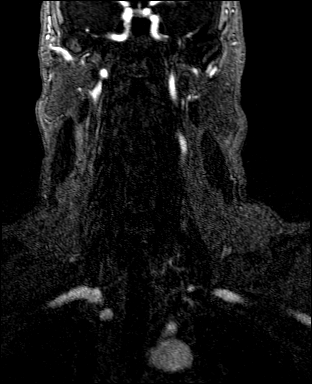
[im 96/96]
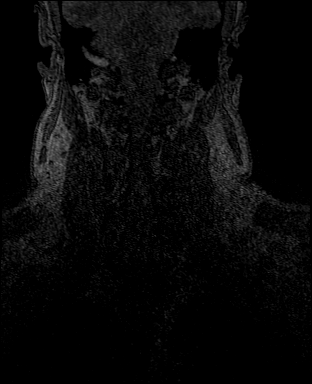

[Series 33: (id)_cor_post_sub · coronal · 0.8mm · 0.78mm/px · 3 of 85 slices shown]
[im 1/85]
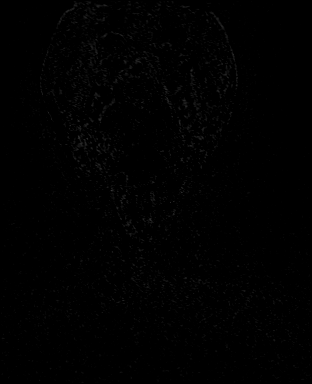
[im 43/85]
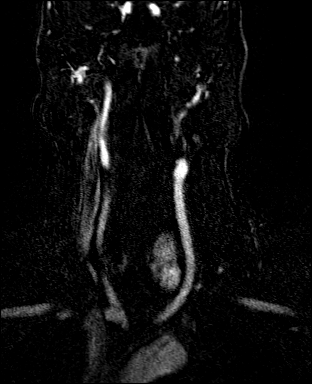
[im 85/85]
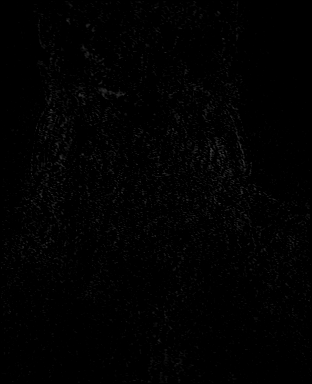

[Series 35: (id)_cor_post_venous · coronal · 0.8mm · 0.78mm/px · 4 of 96 slices shown]
[im 1/96]
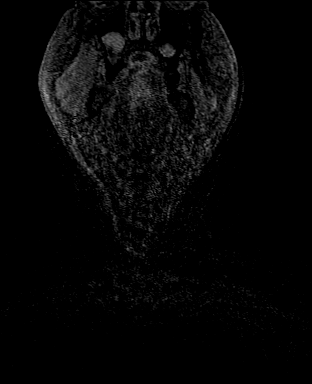
[im 32/96]
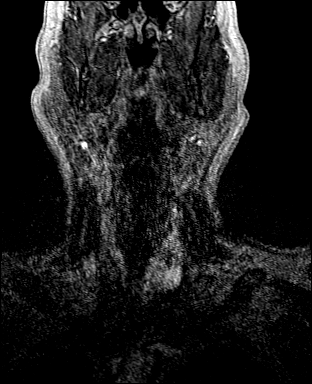
[im 64/96]
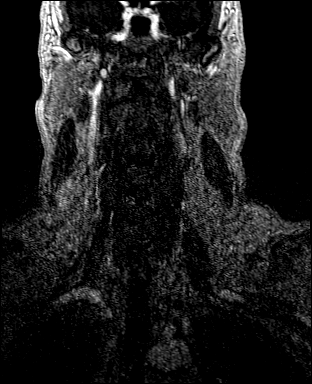
[im 96/96]
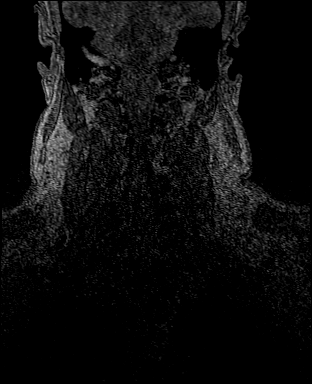

[Series 36: (id)_cor_post_venous_sub · coronal · 0.8mm · 0.78mm/px · 4 of 96 slices shown]
[im 1/96]
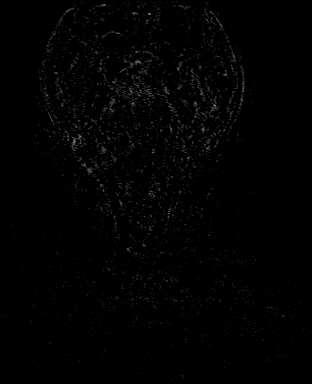
[im 32/96]
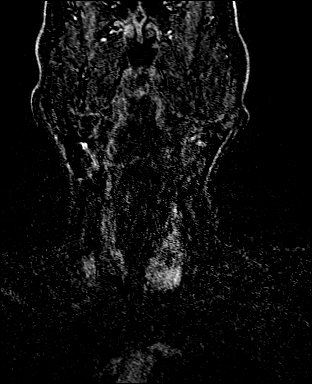
[im 64/96]
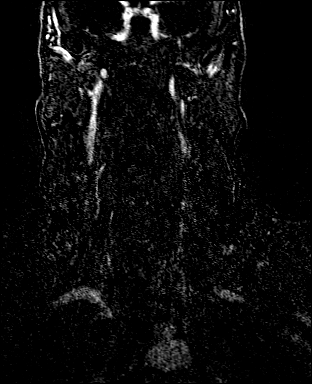
[im 96/96]
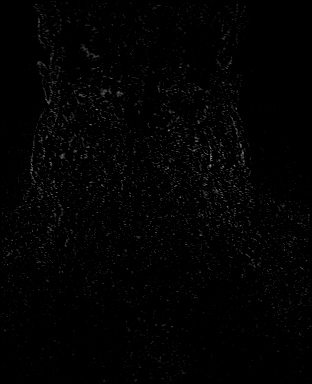

[37 of 48 positions shown; findings below may reference images not displayed]

FINDINGS: MRI HEAD FINDINGS

Brain: No acute infarction, acute hemorrhage, hydrocephalus,
extra-axial collection or mass lesion. No pathologic intracranial
enhancement. Focus of susceptibility artifact (approximately 7 mm)
in the right parietal lobe without edema. There is suggestion of
possible draining vein in this region and slight T2 hypointensity,
suggesting this may represent a cavernous malformation. No correlate
hyperdensity on the same day head CT.

Vascular: See below.

Skull and upper cervical spine: Normal marrow signal.

Sinuses/Orbits: Retention cysts and bilateral maxillary sinuses and
the right sphenoid sinus. No acute orbital abnormality.

Other: No mastoid effusions.

MRA HEAD FINDINGS

Motion limited evaluation.  Within this limitation:

Anterior circulation: No large vessel occlusion or proximal
hemodynamically significant stenosis. Small left posterior
communicating artery infundibulum without visible aneurysm.

Posterior circulation: Patent bilateral intradural vertebral
arteries with small left vertebral artery and basilar artery.
Bilateral posterior communicating arteries with fetal type left PCA,
anatomic variant. Small, but patent bilateral posterior cerebral
arteries proximally. Distal PCA evaluation is limited due to small
size and motion.

MRA NECK FINDINGS

Motion limited evaluation.  Within this limitation:

Aortic arch: Great vessel origins are patent.

Right carotid system: No visible hemodynamically significant
(greater than 50%) stenosis.

Left carotid system: No visible hemodynamically significant (greater
than 50%) stenosis.

Vertebral arteries: Right dominant. Patent right vertebral artery
without visible hemodynamically significant (greater than 50%)
stenosis. The left vertebral artery is small throughout its course,
limiting evaluation (particularly at its origin).

Other: Heterogeneous thyroid with suspected left thyroid nodule.
IMPRESSION: MRI:

1. No evidence of acute intracranial abnormality. Specifically, no
acute infarct.
2. Possible 7 mm cerebral cavernous venous malformation in the right
parietal lobe (versus prior hemorrhage) without evidence of edema or
acute hemorrhage.

MRA head:

1. Motion limited evaluation without large vessel occlusion or
visible proximal hemodynamically significant stenosis.
2. Heterogeneous thyroid with suspected left thyroid nodule.
Consider nonurgent thyroid ultrasound to further characterize.

## 2020-07-11 MED ORDER — ADULT MULTIVITAMIN W/MINERALS CH
1.0000 | ORAL_TABLET | Freq: Every day | ORAL | Status: DC
  Administered 2020-07-11 – 2020-07-12 (×2): 1 via ORAL
  Filled 2020-07-11 (×2): qty 1

## 2020-07-11 MED ORDER — CLOPIDOGREL BISULFATE 300 MG PO TABS
300.0000 mg | ORAL_TABLET | Freq: Once | ORAL | Status: AC
  Administered 2020-07-11: 300 mg via ORAL
  Filled 2020-07-11: qty 1

## 2020-07-11 MED ORDER — LORAZEPAM 2 MG/ML IJ SOLN
0.0000 mg | Freq: Two times a day (BID) | INTRAMUSCULAR | Status: DC
Start: 2020-07-13 — End: 2020-07-12

## 2020-07-11 MED ORDER — METOPROLOL SUCCINATE ER 50 MG PO TB24
100.0000 mg | ORAL_TABLET | Freq: Every day | ORAL | Status: DC
  Administered 2020-07-12: 100 mg via ORAL
  Filled 2020-07-11: qty 2

## 2020-07-11 MED ORDER — SODIUM CHLORIDE 0.9 % IV SOLN: INTRAVENOUS | Status: AC

## 2020-07-11 MED ORDER — ATORVASTATIN CALCIUM 40 MG PO TABS: 40.0000 mg | ORAL_TABLET | Freq: Every day | ORAL | Status: DC

## 2020-07-11 MED ORDER — THIAMINE HCL 100 MG PO TABS
100.0000 mg | ORAL_TABLET | Freq: Every day | ORAL | Status: DC
  Administered 2020-07-12: 100 mg via ORAL
  Filled 2020-07-11: qty 1

## 2020-07-11 MED ORDER — ENOXAPARIN SODIUM 40 MG/0.4ML IJ SOSY
40.0000 mg | PREFILLED_SYRINGE | INTRAMUSCULAR | Status: DC
  Administered 2020-07-11: 40 mg via SUBCUTANEOUS
  Filled 2020-07-11: qty 0.4

## 2020-07-11 MED ORDER — LOSARTAN POTASSIUM 25 MG PO TABS
100.0000 mg | ORAL_TABLET | Freq: Every day | ORAL | Status: DC
  Administered 2020-07-11 – 2020-07-12 (×2): 100 mg via ORAL
  Filled 2020-07-11 (×2): qty 4

## 2020-07-11 MED ORDER — LORAZEPAM 2 MG/ML IJ SOLN
0.0000 mg | Freq: Four times a day (QID) | INTRAMUSCULAR | Status: DC
Start: 2020-07-11 — End: 2020-07-12
  Administered 2020-07-11 (×2): 1 mg via INTRAVENOUS
  Administered 2020-07-12: 2 mg via INTRAVENOUS
  Filled 2020-07-11 (×3): qty 1

## 2020-07-11 MED ORDER — LORAZEPAM 1 MG PO TABS
0.0000 mg | ORAL_TABLET | Freq: Four times a day (QID) | ORAL | Status: DC
  Administered 2020-07-12: 2 mg via ORAL
  Filled 2020-07-11: qty 2

## 2020-07-11 MED ORDER — STROKE: EARLY STAGES OF RECOVERY BOOK
Freq: Once | Status: DC
  Filled 2020-07-11: qty 1

## 2020-07-11 MED ORDER — ASPIRIN EC 81 MG PO TBEC
81.0000 mg | DELAYED_RELEASE_TABLET | Freq: Every day | ORAL | Status: DC
  Administered 2020-07-12: 81 mg via ORAL
  Filled 2020-07-11: qty 1

## 2020-07-11 MED ORDER — AMLODIPINE BESYLATE 5 MG PO TABS
10.0000 mg | ORAL_TABLET | Freq: Every day | ORAL | Status: DC
  Administered 2020-07-12: 10 mg via ORAL
  Filled 2020-07-11: qty 2

## 2020-07-11 MED ORDER — ATORVASTATIN CALCIUM 10 MG PO TABS
10.0000 mg | ORAL_TABLET | Freq: Every day | ORAL | Status: DC
  Administered 2020-07-11 – 2020-07-12 (×2): 10 mg via ORAL
  Filled 2020-07-11 (×2): qty 1

## 2020-07-11 MED ORDER — LORAZEPAM 1 MG PO TABS
0.0000 mg | ORAL_TABLET | Freq: Two times a day (BID) | ORAL | Status: DC
Start: 2020-07-13 — End: 2020-07-12

## 2020-07-11 MED ORDER — GADOBUTROL 1 MMOL/ML IV SOLN
10.0000 mL | Freq: Once | INTRAVENOUS | Status: AC | PRN
  Administered 2020-07-11: 10 mL via INTRAVENOUS

## 2020-07-11 MED ORDER — THIAMINE HCL 100 MG/ML IJ SOLN
100.0000 mg | Freq: Every day | INTRAMUSCULAR | Status: DC
  Administered 2020-07-11: 100 mg via INTRAVENOUS
  Filled 2020-07-11: qty 2

## 2020-07-11 MED ORDER — FOLIC ACID 1 MG PO TABS
1.0000 mg | ORAL_TABLET | Freq: Every day | ORAL | Status: DC
  Administered 2020-07-11 – 2020-07-12 (×2): 1 mg via ORAL
  Filled 2020-07-11 (×2): qty 1

## 2020-07-11 MED ORDER — CLOPIDOGREL BISULFATE 75 MG PO TABS
75.0000 mg | ORAL_TABLET | Freq: Every day | ORAL | Status: DC
  Administered 2020-07-12: 75 mg via ORAL
  Filled 2020-07-11: qty 1

## 2020-07-11 NOTE — H&P (Signed)
History and Physical        Hospital Admission Note Date: 07/11/2020  Patient name: Jesse Baker Medical record number: [id number] Date of birth: 01-14-1966 Age: 55 y.o. Gender: male  PCP: Donita Brooks, MD    Chief Complaint    No chief complaint on file.     HPI:   This is a 55 year old male with past medical history of hypertension, hyperlipidemia, alcohol use who presented to the ED with generalized weakness, fatigue x5 days and left arm numbness this a.m.  Said that he woke up this a.m. with similar fatigue and lightheadedness that he has had for the past 5 days and 30 minutes later had left arm numbness.  The arm numbness/paresthesias is what prompted him to come to the ED and has since nearly resolved.  He also reports daily alcohol use and most recently had 2-3 drinks of vodka yesterday afternoon but does not specify the volume.  He quit smoking cigarettes about 5 to 10 years ago but used to smoke 1-1/2 packs daily since 55 years old.  He denies any history of alcohol withdrawal, seizures or hallucinations.  Has also had 2 COVID-19 contacts including his son (positive 2 weeks ago) and wife (positive about 1 week ago) however he has had multiple negative COVID tests at home.  No history of stroke, no change in vision.  Wife believes that he may have had intermittent slurred speech recently but thinks this may be alcohol related.  She is also noticed that he has started having generalized shakes this afternoon. She is very concerned about his drinking habit.  No other complaints at this time.  In the ED a code stroke was called.  He was not given tPA.   ED Course: Afebrile, hemodynamically stable, on room air. Notable Labs: Sodium 138, K4.1, glucose 169, BUN 33, creatinine 1.19, AST 109, ALT 70, T bili 1.4, WBC 10.0, ethanol: 76, COVID-19 and flu negative.  CT head without acute  infarction.  A code stroke was called and Dr. 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  Neurology recommended transfer to [facility] for TIA work-up.   Vitals:   07/11/20 1234 07/11/20 1357  BP: (!) 156/86 (!) 162/105  Pulse: (!) 110 (!) 105  Resp: (!) 22   Temp:    SpO2: 99%      Review of Systems:  Review of Systems  All other systems reviewed and are negative.   Medical/Social/Family History   Past Medical History: Past Medical History:  Diagnosis Date  . Allergy   . Hyperlipidemia   . Hypertension   . Seasonal allergies     Past Surgical History:  Procedure Laterality Date  . BACK SURGERY     2012, lumbar discectomy     Medications: Prior to Admission medications   Medication Sig Start Date End Date Taking? Authorizing Provider  amLODipine (NORVASC) 10 MG tablet TAKE 1 TABLET BY MOUTH EVERY DAY 06/21/20   Corky Crafts, MD  amoxicillin (AMOXIL) 500 MG  capsule Take by mouth as directed. 09/30/19   [provider]  aspirin EC 81 MG tablet Take 81 mg by mouth daily.    [provider]  atorvastatin (LIPITOR) 10 MG tablet Take 1 tablet (10 mg total) by mouth daily. 07/03/19   Corky Crafts, MD  azithromycin (ZITHROMAX) 250 MG tablet Take 2 tabs PO day 1 one , 02/27/20   Mayers, Cari S, PA-C  chlorhexidine (PERIDEX) 0.12 % solution as directed. 10/01/19   [provider]  diclofenac (VOLTAREN) 75 MG EC tablet Take 75 mg by mouth in the morning and at bedtime. 09/22/19   [provider]  fluticasone (FLONASE) 50 MCG/ACT nasal spray Place 2 sprays into both nostrils daily.    [provider]  loratadine (CLARITIN) 10 MG tablet Take 10 mg by mouth daily.    [provider]  losartan (COZAAR) 100 MG tablet TAKE 1 TABLET BY MOUTH EVERY DAY 06/21/20   Corky Crafts, MD  methocarbamol (ROBAXIN) 500 MG tablet daily. 09/22/19   [provider]  metoprolol succinate (TOPROL-XL) 100 MG 24 hr tablet TAKE 1 TABLET BY MOUTH EVERY DAY 06/21/20   Corky Crafts, MD  sildenafil (REVATIO) 20 MG tablet Take 3-5 tablets 1 hour prior to sexual activity 06/05/18   Corky Crafts, MD    Allergies:  No Known Allergies  Social History:  reports that he has quit smoking. He has never used smokeless tobacco. He reports current alcohol use. He reports that he does not use drugs.  Family History: Family History  Problem Relation Age of Onset  . Hypertension Mother   . Heart attack Paternal Grandmother   . Cancer Paternal Grandfather        possible prostate cancer  . Stroke Neg Hx      Objective   Physical Exam: Blood pressure (!) 162/105, pulse (!) 105, temperature 98.5 F (36.9 C), temperature source Oral, resp. rate (!) 22, height 6\' 3"  (1.905 m), weight 113.4 kg, SpO2 99 %.  Physical Exam Vitals and nursing note reviewed.  Constitutional:      Appearance: Normal appearance.     Comments: Tremulous  HENT:     Head: Normocephalic and atraumatic.  Eyes:     Conjunctiva/sclera: Conjunctivae normal.  Cardiovascular:     Rate and Rhythm: Regular rhythm. Tachycardia present.  Pulmonary:     Effort: Pulmonary effort is normal.     Breath sounds: Normal breath sounds.  Abdominal:     General: Abdomen is flat.     Palpations: Abdomen is soft.  Musculoskeletal:        General: No swelling or tenderness.  Skin:    Coloration: Skin is not jaundiced or pale.  Neurological:     General: No focal deficit present.     Mental Status: He is alert. Mental status is at baseline.     Sensory: No sensory deficit.  Psychiatric:        Mood and Affect: Mood normal.        Behavior: Behavior normal.     LABS on Admission: I have  personally reviewed all the labs and imaging below    Basic Metabolic Panel: Recent Labs  Lab 07/11/20 0843 07/11/20 0858  NA 138 140  K 4.1 4.2  CL 104 106  CO2 23  --   GLUCOSE 169* 166*  BUN 33* 31*  CREATININE 1.19 1.20  CALCIUM 9.0  --    Liver Function Tests: Recent Labs  Lab 07/11/20 0843  AST 109*  ALT 70*  ALKPHOS 121  BILITOT 1.4*  PROT 8.0  ALBUMIN 4.3   No results for input(s): LIPASE, AMYLASE in the last 168 hours. No results for input(s): AMMONIA in the last 168 hours. CBC: Recent Labs  Lab 07/11/20 0843 07/11/20 0858  WBC 10.0  --   NEUTROABS 7.5  --   HGB 15.1 15.6  HCT 44.7 46.0  MCV 101.1*  --   PLT 246  --    Cardiac Enzymes: No results for input(s): CKTOTAL, CKMB, CKMBINDEX, TROPONINI in the last 168 hours. BNP: Invalid input(s): POCBNP CBG: Recent Labs  Lab 07/11/20 0902  GLUCAP 166*    Radiological Exams on Admission:  MR ANGIO HEAD WO CONTRAST  Result Date: 07/11/2020 CLINICAL DATA:  Concern for stroke. EXAM: MRI HEAD WITHOUT CONTRAST MRA HEAD WITHOUT CONTRAST MRA NECK WITHOUT AND WITH CONTRAST TECHNIQUE: Multiplanar, multi-echo pulse sequences of the brain and surrounding structures were acquired without intravenous contrast. Angiographic images of the Circle of Willis were acquired using MRA technique without intravenous contrast. Angiographic images of the neck were acquired using MRA technique without and with intravenous contrast. Carotid stenosis measurements (when applicable) are obtained utilizing NASCET criteria, using the distal internal carotid diameter as the denominator. CONTRAST:  10mL GADAVIST GADOBUTROL 1 MMOL/ML IV SOLN COMPARISON:  Same day CT head. FINDINGS: MRI HEAD FINDINGS Brain: No acute infarction, acute hemorrhage, hydrocephalus, extra-axial collection or mass lesion. No pathologic intracranial enhancement. Focus of susceptibility artifact (approximately 7 mm) in the right parietal lobe without edema. There is  suggestion of possible draining vein in this region and slight T2 hypointensity, suggesting this may represent a cavernous malformation. No correlate hyperdensity on the same day head CT. Vascular: See below. Skull and upper cervical spine: Normal marrow signal. Sinuses/Orbits: Retention cysts and bilateral maxillary sinuses and the right sphenoid sinus. No acute orbital abnormality. Other: No mastoid effusions. MRA HEAD FINDINGS Motion limited evaluation.  Within this limitation: Anterior circulation: No large vessel occlusion or proximal hemodynamically significant stenosis. Small left posterior communicating artery infundibulum without visible aneurysm. Posterior circulation: Patent bilateral intradural vertebral arteries with small left vertebral artery and basilar artery. Bilateral posterior communicating arteries with fetal type left PCA, anatomic variant. Small, but patent bilateral posterior cerebral arteries proximally. Distal PCA evaluation is limited due to small size and motion. MRA NECK FINDINGS Motion limited evaluation.  Within this limitation: Aortic arch: Great vessel origins are patent. Right carotid system: No visible hemodynamically significant (greater than 50%) stenosis. Left carotid system: No visible hemodynamically significant (greater than 50%) stenosis. Vertebral arteries: Right dominant. Patent right vertebral artery without visible hemodynamically significant (greater than 50%) stenosis. The left vertebral artery is small throughout its course, limiting evaluation (particularly at its origin). Other: Heterogeneous thyroid with suspected left thyroid nodule. IMPRESSION: MRI: 1. No evidence of acute intracranial abnormality. Specifically, no acute infarct. 2. 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 MRA head: 1. Motion limited evaluation without large vessel occlusion or visible  proximal hemodynamically significant stenosis. 2. Heterogeneous thyroid with suspected left thyroid nodule. Consider nonurgent thyroid ultrasound to further characterize. Electronically Signed   By: Feliberto Harts MD   On: 07/11/2020 13:03   MR Angiogram Neck W or Wo Contrast  Result Date: 07/11/2020 CLINICAL DATA:  Concern for stroke. EXAM: MRI HEAD WITHOUT CONTRAST MRA HEAD WITHOUT CONTRAST MRA NECK WITHOUT AND WITH CONTRAST TECHNIQUE:  Multiplanar, multi-echo pulse sequences of the brain and surrounding structures were acquired without intravenous contrast. Angiographic images of the Circle of Willis were acquired using MRA technique without intravenous contrast. Angiographic images of the neck were acquired using MRA technique without and with intravenous contrast. Carotid stenosis measurements (when applicable) are obtained utilizing NASCET criteria, using the distal internal carotid diameter as the denominator. CONTRAST:  10mL GADAVIST GADOBUTROL 1 MMOL/ML IV SOLN COMPARISON:  Same day CT head. FINDINGS: MRI HEAD FINDINGS Brain: No acute infarction, acute hemorrhage, hydrocephalus, extra-axial collection or mass lesion. No pathologic intracranial enhancement. Focus of susceptibility artifact (approximately 7 mm) in the right parietal lobe without edema. There is suggestion of possible draining vein in this region and slight T2 hypointensity, suggesting this may represent a cavernous malformation. No correlate hyperdensity on the same day head CT. Vascular: See below. Skull and upper cervical spine: Normal marrow signal. Sinuses/Orbits: Retention cysts and bilateral maxillary sinuses and the right sphenoid sinus. No acute orbital abnormality. Other: No mastoid effusions. MRA HEAD FINDINGS Motion limited evaluation.  Within this limitation: Anterior circulation: No large vessel occlusion or proximal hemodynamically significant stenosis. Small left posterior communicating artery infundibulum without visible  aneurysm. Posterior circulation: Patent bilateral intradural vertebral arteries with small left vertebral artery and basilar artery. Bilateral posterior communicating arteries with fetal type left PCA, anatomic variant. Small, but patent bilateral posterior cerebral arteries proximally. Distal PCA evaluation is limited due to small size and motion. MRA NECK FINDINGS Motion limited evaluation.  Within this limitation: Aortic arch: Great vessel origins are patent. Right carotid system: No visible hemodynamically significant (greater than 50%) stenosis. Left carotid system: No visible hemodynamically significant (greater than 50%) stenosis. Vertebral arteries: Right dominant. Patent right vertebral artery without visible hemodynamically significant (greater than 50%) stenosis. The left vertebral artery is small throughout its course, limiting evaluation (particularly at its origin). Other: Heterogeneous thyroid with suspected left thyroid nodule. IMPRESSION: MRI: 1. No evidence of acute intracranial abnormality. Specifically, no acute infarct. 2. 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 MRA head: 1. Motion limited evaluation without large vessel occlusion or visible proximal hemodynamically significant stenosis. 2. Heterogeneous thyroid with suspected left thyroid nodule. Consider nonurgent thyroid ultrasound to further characterize. Electronically Signed   By: Feliberto HartsFrederick S Jones MD   On: 07/11/2020 13:03   MR BRAIN WO CONTRAST  Result Date: 07/11/2020 CLINICAL DATA:  Concern for stroke. EXAM: MRI HEAD WITHOUT CONTRAST MRA HEAD WITHOUT CONTRAST MRA NECK WITHOUT AND WITH CONTRAST TECHNIQUE: Multiplanar, multi-echo pulse sequences of the brain and surrounding structures were acquired without intravenous contrast. Angiographic images of the Circle of Willis were acquired using MRA technique without intravenous  contrast. Angiographic images of the neck were acquired using MRA technique without and with intravenous contrast. Carotid stenosis measurements (when applicable) are obtained utilizing NASCET criteria, using the distal internal carotid diameter as the denominator. CONTRAST:  10mL GADAVIST GADOBUTROL 1 MMOL/ML IV SOLN COMPARISON:  Same day CT head. FINDINGS: MRI HEAD FINDINGS Brain: No acute infarction, acute hemorrhage, hydrocephalus, extra-axial collection or mass lesion. No pathologic intracranial enhancement. Focus of susceptibility artifact (approximately 7 mm) in the right parietal lobe without edema. There is suggestion of possible draining vein in this region and slight T2 hypointensity, suggesting this may represent a cavernous malformation. No correlate hyperdensity on the same day head CT. Vascular: See below. Skull and upper cervical spine: Normal marrow signal. Sinuses/Orbits: Retention cysts and  bilateral maxillary sinuses and the right sphenoid sinus. No acute orbital abnormality. Other: No mastoid effusions. MRA HEAD FINDINGS Motion limited evaluation.  Within this limitation: Anterior circulation: No large vessel occlusion or proximal hemodynamically significant stenosis. Small left posterior communicating artery infundibulum without visible aneurysm. Posterior circulation: Patent bilateral intradural vertebral arteries with small left vertebral artery and basilar artery. Bilateral posterior communicating arteries with fetal type left PCA, anatomic variant. Small, but patent bilateral posterior cerebral arteries proximally. Distal PCA evaluation is limited due to small size and motion. MRA NECK FINDINGS Motion limited evaluation.  Within this limitation: Aortic arch: Great vessel origins are patent. Right carotid system: No visible hemodynamically significant (greater than 50%) stenosis. Left carotid system: No visible hemodynamically significant (greater than 50%) stenosis. Vertebral arteries:  Right dominant. Patent right vertebral artery without visible hemodynamically significant (greater than 50%) stenosis. The left vertebral artery is small throughout its course, limiting evaluation (particularly at its origin). Other: Heterogeneous thyroid with suspected left thyroid nodule. IMPRESSION: MRI: 1. No evidence of acute intracranial abnormality. Specifically, no acute infarct. 2. 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 MRA head: 1. Motion limited evaluation without large vessel occlusion or visible proximal hemodynamically significant stenosis. 2. Heterogeneous thyroid with suspected left thyroid nodule. Consider nonurgent thyroid ultrasound to further characterize. Electronically Signed   By: Feliberto Harts MD   On: 07/11/2020 13:03   CT HEAD CODE STROKE WO CONTRAST  Result Date: 07/11/2020 CLINICAL DATA:  Code stroke.  Left arm weakness EXAM: CT HEAD WITHOUT CONTRAST TECHNIQUE: Contiguous axial images were obtained from the base of the skull through the vertex without intravenous contrast. COMPARISON:  2015 FINDINGS: Brain: There is no acute intracranial hemorrhage, mass effect, or edema. Gray-white differentiation is preserved. Ventricles and sulci are normal in size and configuration. No extra-axial collection. Vascular: No hyperdense vessel. Skull: Unremarkable. Sinuses/Orbits: Maxillary sinus retention cyst. Orbits are unremarkable. Other: Mastoid air cells are clear. ASPECTS ([place]berta Stroke Program Early CT Score) - Ganglionic level infarction (caudate, lentiform nuclei, internal capsule, insula, M1-M3 cortex): 7 - Supraganglionic infarction (M4-M6 cortex): 3 Total score (0-10 with 10 being normal): 10 IMPRESSION: There is no acute intracranial hemorrhage or evidence of acute infarction. ASPECT score is 10. These results were called by telephone at the time of interpretation on  07/11/2020 at 9:04 am to provider MARGAUX VENTER , who verbally acknowledged these results. Electronically Signed   By: Guadlupe Spanish M.D.   On: 07/11/2020 09:05      EKG: RBBB and LAFB, EKG reads acute inferior infarction however on personal read this is not noted   A & P   Principal Problem:   Left arm numbness Active Problems:   Essential hypertension, benign   Hyperlipidemia   Alcohol dependence with uncomplicated withdrawal (HCC)   Elevated LFTs   1. Left arm numbness/paresthesias suspect secondary to TIA a. Symptoms have resolved b. 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 Increase statin from 10-> 40 mg d. Neurology consulted and recommended transfer to [facility] with: i. HbA1c and Fasting lipid panel ii. Frequent neurochecks iii. Echo iv. Prophylactic aspirin 81 mg daily, Plavix 75 mg daily following 300 mg load v. Risk factor modification vi. Telemetry vii. PT, OT, speech consult  2. Alcohol abuse, currently in withdrawal a.  CIWA protocol b. Check Mg and P c. TOC consulted to provide resources  3. Generalized weakness a. Unclear etiology b. Checking TSH as below  4. Hypertension a. Continue home amlodipine, losartan and Toprol-XL  5. Hyperlipidemia a. Statin increased from 10 mg to 40 mg as above b. Fasting lipid panel ordered for tomorrow  6. Incidental left thyroid nodule a. TSH b. Consider nonemergent thyroid US outpatient  7. Asymptomatic Elevated LFTs, suspect alcoholic hepatitis a. Trend   DVT prophylaxis: Lovenox   Code Status: Full Code  Diet: Pending SLP Family Communication: Admission, patients condition and plan of care including tests being ordered have been discussed with the patient who indicates understanding and agrees with the plan and Code Status. Patient's wife was updated  Disposition  Plan: The appropriate patient status for this patient is OBSERVATION. Observation status is judged to be reasonable and necessary in order to provide the required intensity of service to ensure the patient's safety. The patient's presenting symptoms, physical exam findings, and initial radiographic and laboratory data in the context of their medical condition is felt to place them at decreased risk for further clinical deterioration. Furthermore, it is anticipated that the patient will be medically stable for discharge from the hospital within 2 midnights of admission. The following factors support the patient status of observation.   " The patient's presenting symptoms include left arm paresthesias. " The physical exam findings include tremulousness, tachycardia. " The initial radiographic and laboratory data are elevated alcohol level.    Status is: Observation  The patient remains OBS appropriate and will d/c before 2 midnights.  Dispo: The patient is from: Home              Anticipated d/c is to: Home              Patient currently is not medically stable to d/c.   Difficult to place patient No       Consultants  . Neurology  Procedures  . None  Time Spent on Admission: 67 minutes    Jae Dire, DO [facility]  07/11/2020, 2:02 PM

## 2020-07-11 NOTE — ED Notes (Signed)
Pt transported to CT ?

## 2020-07-11 NOTE — ED Notes (Signed)
Patient denies pain and is resting comfortably.  

## 2020-07-11 NOTE — Consult Note (Signed)
[facility] Telemedicine Consult  Requesting Provider: Darlyn Chamber Consult Participants: Patient, Jesse Baker, [place], bedsidenurse Location of the provider: [facility] Location of the patient: [facility]  This consult was provided via telemedicine with 2-way video and audio communication. The patient/family was informed that care would be provided in this way and agreed to receive care in this manner.    Chief Complaint: Left arm weakness  HPI: Patient reports that he has been feeling "dizzy" which he reports as a sensation of lightheadedness for several days.  He woke up this morning around 6:30 AM without any focal deficits.  Around 7 AM, he began having numbness and weakness of his left arm which he states is subsequently improved.  He denies any involvement of his leg or face.  He denies any trouble with walking during this episode.  He denies any double vision.  Given that there is still concern for some residual symptoms, a code stroke was activated.    LKW: Unclear time of onset tpa given?: No, unclear time of onset, mild symptoms IR Thrombectomy? No, no signs of LVO Modified Rankin Scale: 0-Completely asymptomatic and back to baseline post- stroke Time of teleneurologist evaluation: 8:58  Exam: Vitals:   07/11/20 0755  BP: 135/84  Pulse: 97  Temp: 98.5 F (36.9 C)  SpO2: 97%  Blood pressure on recheck 175/101  General: In bed, in no apparent distress  1A: Level of Consciousness - 0 1B: Ask Month and Age - 0 1C: 'Blink Eyes' & 'Squeeze Hands' - 0 2: Test Horizontal Extraocular Movements - 0 3: Test Visual Fields - 0 4: Test Facial Palsy - 0 5A: Test Left Arm Motor Drift - 0 5B: Test Right Arm Motor Drift - 0 6A: Test Left Leg Motor Drift - 0 6B: Test Right Leg Motor Drift - 0 7: Test Limb Ataxia - 1 8: Test Sensation - 0 9: Test Language/Aphasia- 0 10: Test Dysarthria - 0 11: Test Extinction/Inattention - 0 NIHSS  score: 1   Imaging Reviewed: CT head -no acute findings  Labs reviewed in epic and pertinent values follow: Glucose 166 Creatinine 1.2   Assessment: 55 year old male with a history of hypertension and hyperlipidemia who presents with transient left arm numbness in the setting of ongoing "dizziness."  Though the description of the dizziness is more consistent with lightheadedness, I do have some concern this may make actual onset 5 days ago with this morning's episode representing transient worsening rather than truly de novo symptoms.  Also, given the mildness of his symptoms and the uncertainty of onset time, I would not favor IV tPA at this time.  It is possible that the dizziness is unrelated, and with the improvement in his left arm weakness/numbness if imaging is negative I would still favor treating this as TIA.  With an ABCD2 of 5, I would favor dual antiplatelet therapy.  Recommendations:  - HgbA1c, fasting lipid panel - MRI, MRA  of the brain without contrast, MRA neck - Frequent neuro checks - Echocardiogram - Prophylactic therapy-Antiplatelet med: Aspirin -81 mg daily, Plavix 75 mg daily following 300 mg load. - Risk factor modification - Telemetry monitoring - PT consult, OT consult, Speech consult - Stroke team to follow      This patient is receiving care for possible acute neurological changes. There was 35 minutes of care by this provider at the time of service, including time for direct evaluation via telemedicine, review of medical records, imaging studies and discussion of  findings with providers, the patient and/or family.  Ritta Slot, MD [facility] [phone]  If 7pm- 7am, please page neurology on call as listed in [facility].

## 2020-07-11 NOTE — ED Notes (Signed)
Pt in MRI.

## 2020-07-11 NOTE — ED Notes (Signed)
CBG 166 

## 2020-07-11 NOTE — ED Provider Notes (Signed)
[facility] DEPT Provider Note   CSN: [id number] Arrival date & time: 07/11/20  3734     History No chief complaint on file.   Jesse Baker is a 55 y.o. male with PMHx HTN and HLD who presents to the ED today with complaint of left arm numbness that began around 7:30 AM this morning. Pt woke up about 30 minutes earlier and felt fine besides some fatigue and lightheadedness that he has had for about 5 days. His son had COVID 2 weeks ago and his wife had COVID about 1 week ago. Pt has had several negative at home COVID tests as well as a PCR test with [facility]. He became concerned today with new symptom of left arm numbness prompting ED visit. No hx of strokes. No other complaints at this time.   The history is provided by the patient and medical records.       Past Medical History:  Diagnosis Date  . Allergy   . Hyperlipidemia   . Hypertension   . Seasonal allergies     Patient Active Problem List   Diagnosis Date Noted  . Left arm numbness 07/11/2020  . Hyperlipidemia 11/29/2014  . Nonspecific abnormal electrocardiogram (ECG) (EKG) 11/03/2013  . Essential hypertension, benign 11/03/2013  . Obesity, unspecified 11/03/2013    Past Surgical History:  Procedure Laterality Date  . BACK SURGERY     2012, lumbar discectomy        Family History  Problem Relation Age of Onset  . Hypertension Mother   . Heart attack Paternal Grandmother   . Cancer Paternal Grandfather        possible prostate cancer  . Stroke Neg Hx     Social History   Tobacco Use  . Smoking status: Former Research scientist (life sciences)  . Smokeless tobacco: Never Used  Vaping Use  . Vaping Use: Never used  Substance Use Topics  . Alcohol use: Yes  . Drug use: No    Home Medications Prior to Admission medications   Medication Sig Start Date End Date Taking? Authorizing Provider  amLODipine (NORVASC) 10 MG tablet TAKE 1 TABLET BY MOUTH EVERY DAY 06/21/20   Jettie Booze, MD   amoxicillin (AMOXIL) 500 MG capsule Take by mouth as directed. 09/30/19   [provider]  aspirin EC 81 MG tablet Take 81 mg by mouth daily.    [provider]  atorvastatin (LIPITOR) 10 MG tablet Take 1 tablet (10 mg total) by mouth daily. 07/03/19   Jettie Booze, MD  azithromycin (ZITHROMAX) 250 MG tablet Take 2 tabs PO day 1 one , 02/27/20   Mayers, Cari S, PA-C  chlorhexidine (PERIDEX) 0.12 % solution as directed. 10/01/19   [provider]  diclofenac (VOLTAREN) 75 MG EC tablet Take 75 mg by mouth in the morning and at bedtime. 09/22/19   [provider]  fluticasone (FLONASE) 50 MCG/ACT nasal spray Place 2 sprays into both nostrils daily.    [provider]  loratadine (CLARITIN) 10 MG tablet Take 10 mg by mouth daily.    [provider]  losartan (COZAAR) 100 MG tablet TAKE 1 TABLET BY MOUTH EVERY DAY 06/21/20   Jettie Booze, MD  methocarbamol (ROBAXIN) 500 MG tablet daily. 09/22/19   [provider]  metoprolol succinate (TOPROL-XL) 100 MG 24 hr tablet TAKE 1 TABLET BY MOUTH EVERY DAY 06/21/20   Jettie Booze, MD  sildenafil (REVATIO) 20 MG tablet Take 3-5 tablets 1 hour prior  to sexual activity 06/05/18   Jettie Booze, MD    Allergies    Patient has no known allergies.  Review of Systems   Review of Systems  Constitutional: Positive for fatigue. Negative for chills and fever.  Respiratory: Negative for cough and shortness of breath.   Cardiovascular: Negative for chest pain.  Neurological: Positive for light-headedness and numbness (left arm). Negative for weakness and headaches.  All other systems reviewed and are negative.   Physical Exam Updated Vital Signs BP 135/84 (BP Location: Right Arm)   Pulse 97   Temp 98.5 F (36.9 C) (Oral)   Ht $R'6\' 3"'Fp$  (1.905 m)   Wt 113.4 kg   SpO2 97%   BMI 31.25 kg/m   Physical Exam Vitals and nursing note reviewed.  Constitutional:      Appearance:  He is not ill-appearing or diaphoretic.  HENT:     Head: Normocephalic and atraumatic.  Eyes:     Extraocular Movements: Extraocular movements intact.     Conjunctiva/sclera: Conjunctivae normal.     Pupils: Pupils are equal, round, and reactive to light.  Cardiovascular:     Rate and Rhythm: Normal rate and regular rhythm.     Pulses: Normal pulses.  Pulmonary:     Effort: Pulmonary effort is normal.     Breath sounds: Normal breath sounds. No wheezing, rhonchi or rales.  Abdominal:     Palpations: Abdomen is soft.     Tenderness: There is no abdominal tenderness. There is no guarding or rebound.  Musculoskeletal:     Cervical back: Neck supple.  Skin:    General: Skin is warm and dry.  Neurological:     Mental Status: He is alert.     Comments: Alert and oriented to self, place, time and event.   Speech is fluent, clear without dysarthria or dysphasia.   Strength 5/5 in upper/lower extremities  Slight decreased sensation to LUE.   Normal gait.  Negative Romberg. No pronator drift.  Normal finger-to-nose and feet tapping.  CN I not tested  CN II grossly intact visual fields bilaterally. Did not visualize posterior eye.   CN III, IV, VI PERRLA and EOMs intact bilaterally  CN V Intact sensation to sharp and light touch to the face  CN VII facial movements symmetric  CN VIII not tested  CN IX, X no uvula deviation, symmetric rise of soft palate  CN XI 5/5 SCM and trapezius strength bilaterally  CN XII Midline tongue protrusion, symmetric L/R movements      ED Results / Procedures / Treatments   Labs (all labs ordered are listed, but only abnormal results are displayed) Labs Reviewed  ETHANOL - Abnormal; Notable for the following components:      Result Value   Alcohol, Ethyl (B) 76 (*)    All other components within normal limits  CBC - Abnormal; Notable for the following components:   MCV 101.1 (*)    MCH 34.2 (*)    RDW 11.4 (*)    All other components within  normal limits  COMPREHENSIVE METABOLIC PANEL - Abnormal; Notable for the following components:   Glucose, Bld 169 (*)    BUN 33 (*)    AST 109 (*)    ALT 70 (*)    Total Bilirubin 1.4 (*)    All other components within normal limits  I-STAT CHEM 8, ED - Abnormal; Notable for the following components:   BUN 31 (*)    Glucose, Bld 166 (*)  Calcium, Ion 1.13 (*)    All other components within normal limits  CBG MONITORING, ED - Abnormal; Notable for the following components:   Glucose-Capillary 166 (*)    All other components within normal limits  RESP PANEL BY RT-PCR (FLU A&B, COVID) ARPGX2  PROTIME-INR  APTT  DIFFERENTIAL  RAPID URINE DRUG SCREEN, HOSP PERFORMED  URINALYSIS, ROUTINE W REFLEX MICROSCOPIC  TROPONIN I (HIGH SENSITIVITY)  TROPONIN I (HIGH SENSITIVITY)    EKG EKG Interpretation  Date/Time:  Monday July 11 2020 09:01:34 EDT Ventricular Rate:  93 PR Interval:  185 QRS Duration: 158 QT Interval:  407 QTC Calculation: 504 R Axis:   268 Text Interpretation: Sinus rhythm RBBB and LAFB Inferior infarct, acute Lateral leads are also involved >>> Acute MI <<< RBBB old, wavering baseline but no STEMI Confirmed by Lavenia Atlas [phone]) on 07/11/2020 9:07:19 AM   Radiology MR ANGIO HEAD WO CONTRAST  Result Date: 07/11/2020 CLINICAL DATA:  Concern for stroke. EXAM: MRI HEAD WITHOUT CONTRAST MRA HEAD WITHOUT CONTRAST MRA NECK WITHOUT AND WITH CONTRAST TECHNIQUE: Multiplanar, multi-echo pulse sequences of the brain and surrounding structures were acquired without intravenous contrast. Angiographic images of the Circle of Willis were acquired using MRA technique without intravenous contrast. Angiographic images of the neck were acquired using MRA technique without and with intravenous contrast. Carotid stenosis measurements (when applicable) are obtained utilizing NASCET criteria, using the distal internal carotid diameter as the denominator. CONTRAST:  68mL GADAVIST GADOBUTROL 1  MMOL/ML IV SOLN COMPARISON:  Same day CT head. FINDINGS: MRI HEAD FINDINGS Brain: No acute infarction, acute hemorrhage, hydrocephalus, extra-axial collection or mass lesion. No pathologic intracranial enhancement. Focus of susceptibility artifact (approximately 7 mm) in the right parietal lobe without edema. There is suggestion of possible draining vein in this region and slight T2 hypointensity, suggesting this may represent a cavernous malformation. No correlate hyperdensity on the same day head CT. Vascular: See below. Skull and upper cervical spine: Normal marrow signal. Sinuses/Orbits: Retention cysts and bilateral maxillary sinuses and the right sphenoid sinus. No acute orbital abnormality. Other: No mastoid effusions. MRA HEAD FINDINGS Motion limited evaluation.  Within this limitation: Anterior circulation: No large vessel occlusion or proximal hemodynamically significant stenosis. Small left posterior communicating artery infundibulum without visible aneurysm. Posterior circulation: Patent bilateral intradural vertebral arteries with small left vertebral artery and basilar artery. Bilateral posterior communicating arteries with fetal type left PCA, anatomic variant. Small, but patent bilateral posterior cerebral arteries proximally. Distal PCA evaluation is limited due to small size and motion. MRA NECK FINDINGS Motion limited evaluation.  Within this limitation: Aortic arch: Great vessel origins are patent. Right carotid system: No visible hemodynamically significant (greater than 50%) stenosis. Left carotid system: No visible hemodynamically significant (greater than 50%) stenosis. Vertebral arteries: Right dominant. Patent right vertebral artery without visible hemodynamically significant (greater than 50%) stenosis. The left vertebral artery is small throughout its course, limiting evaluation (particularly at its origin). Other: Heterogeneous thyroid with suspected left thyroid nodule. IMPRESSION: MRI:  1. No evidence of acute intracranial abnormality. Specifically, no acute infarct. 2. 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 MRA head: 1. Motion limited evaluation without large vessel occlusion or visible proximal hemodynamically significant stenosis. 2. Heterogeneous thyroid with suspected left thyroid nodule. Consider nonurgent thyroid ultrasound to further characterize. Electronically Signed   By: Margaretha Sheffield MD   On: 07/11/2020 13:03   MR Angiogram Neck W or  Wo Contrast  Result Date: 07/11/2020 CLINICAL DATA:  Concern for stroke. EXAM: MRI HEAD WITHOUT CONTRAST MRA HEAD WITHOUT CONTRAST MRA NECK WITHOUT AND WITH CONTRAST TECHNIQUE: Multiplanar, multi-echo pulse sequences of the brain and surrounding structures were acquired without intravenous contrast. Angiographic images of the Circle of Willis were acquired using MRA technique without intravenous contrast. Angiographic images of the neck were acquired using MRA technique without and with intravenous contrast. Carotid stenosis measurements (when applicable) are obtained utilizing NASCET criteria, using the distal internal carotid diameter as the denominator. CONTRAST:  17mL GADAVIST GADOBUTROL 1 MMOL/ML IV SOLN COMPARISON:  Same day CT head. FINDINGS: MRI HEAD FINDINGS Brain: No acute infarction, acute hemorrhage, hydrocephalus, extra-axial collection or mass lesion. No pathologic intracranial enhancement. Focus of susceptibility artifact (approximately 7 mm) in the right parietal lobe without edema. There is suggestion of possible draining vein in this region and slight T2 hypointensity, suggesting this may represent a cavernous malformation. No correlate hyperdensity on the same day head CT. Vascular: See below. Skull and upper cervical spine: Normal marrow signal. Sinuses/Orbits: Retention cysts and bilateral maxillary sinuses and the  right sphenoid sinus. No acute orbital abnormality. Other: No mastoid effusions. MRA HEAD FINDINGS Motion limited evaluation.  Within this limitation: Anterior circulation: No large vessel occlusion or proximal hemodynamically significant stenosis. Small left posterior communicating artery infundibulum without visible aneurysm. Posterior circulation: Patent bilateral intradural vertebral arteries with small left vertebral artery and basilar artery. Bilateral posterior communicating arteries with fetal type left PCA, anatomic variant. Small, but patent bilateral posterior cerebral arteries proximally. Distal PCA evaluation is limited due to small size and motion. MRA NECK FINDINGS Motion limited evaluation.  Within this limitation: Aortic arch: Great vessel origins are patent. Right carotid system: No visible hemodynamically significant (greater than 50%) stenosis. Left carotid system: No visible hemodynamically significant (greater than 50%) stenosis. Vertebral arteries: Right dominant. Patent right vertebral artery without visible hemodynamically significant (greater than 50%) stenosis. The left vertebral artery is small throughout its course, limiting evaluation (particularly at its origin). Other: Heterogeneous thyroid with suspected left thyroid nodule. IMPRESSION: MRI: 1. No evidence of acute intracranial abnormality. Specifically, no acute infarct. 2. 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 MRA head: 1. Motion limited evaluation without large vessel occlusion or visible proximal hemodynamically significant stenosis. 2. Heterogeneous thyroid with suspected left thyroid nodule. Consider nonurgent thyroid ultrasound to further characterize. Electronically Signed   By: Margaretha Sheffield MD   On: 07/11/2020 13:03   MR BRAIN WO CONTRAST  Result Date: 07/11/2020 CLINICAL DATA:  Concern for stroke. EXAM:  MRI HEAD WITHOUT CONTRAST MRA HEAD WITHOUT CONTRAST MRA NECK WITHOUT AND WITH CONTRAST TECHNIQUE: Multiplanar, multi-echo pulse sequences of the brain and surrounding structures were acquired without intravenous contrast. Angiographic images of the Circle of Willis were acquired using MRA technique without intravenous contrast. Angiographic images of the neck were acquired using MRA technique without and with intravenous contrast. Carotid stenosis measurements (when applicable) are obtained utilizing NASCET criteria, using the distal internal carotid diameter as the denominator. CONTRAST:  83mL GADAVIST GADOBUTROL 1 MMOL/ML IV SOLN COMPARISON:  Same day CT head. FINDINGS: MRI HEAD FINDINGS Brain: No acute infarction, acute hemorrhage, hydrocephalus, extra-axial collection or mass lesion. No pathologic intracranial enhancement. Focus of susceptibility artifact (approximately 7 mm) in the right parietal lobe without edema. There is suggestion of possible draining vein in this region and slight T2 hypointensity, suggesting this may  represent a cavernous malformation. No correlate hyperdensity on the same day head CT. Vascular: See below. Skull and upper cervical spine: Normal marrow signal. Sinuses/Orbits: Retention cysts and bilateral maxillary sinuses and the right sphenoid sinus. No acute orbital abnormality. Other: No mastoid effusions. MRA HEAD FINDINGS Motion limited evaluation.  Within this limitation: Anterior circulation: No large vessel occlusion or proximal hemodynamically significant stenosis. Small left posterior communicating artery infundibulum without visible aneurysm. Posterior circulation: Patent bilateral intradural vertebral arteries with small left vertebral artery and basilar artery. Bilateral posterior communicating arteries with fetal type left PCA, anatomic variant. Small, but patent bilateral posterior cerebral arteries proximally. Distal PCA evaluation is limited due to small size and motion.  MRA NECK FINDINGS Motion limited evaluation.  Within this limitation: Aortic arch: Great vessel origins are patent. Right carotid system: No visible hemodynamically significant (greater than 50%) stenosis. Left carotid system: No visible hemodynamically significant (greater than 50%) stenosis. Vertebral arteries: Right dominant. Patent right vertebral artery without visible hemodynamically significant (greater than 50%) stenosis. The left vertebral artery is small throughout its course, limiting evaluation (particularly at its origin). Other: Heterogeneous thyroid with suspected left thyroid nodule. IMPRESSION: MRI: 1. No evidence of acute intracranial abnormality. Specifically, no acute infarct. 2. 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 MRA head: 1. Motion limited evaluation without large vessel occlusion or visible proximal hemodynamically significant stenosis. 2. Heterogeneous thyroid with suspected left thyroid nodule. Consider nonurgent thyroid ultrasound to further characterize. Electronically Signed   By: Margaretha Sheffield MD   On: 07/11/2020 13:03   CT HEAD CODE STROKE WO CONTRAST  Result Date: 07/11/2020 CLINICAL DATA:  Code stroke.  Left arm weakness EXAM: CT HEAD WITHOUT CONTRAST TECHNIQUE: Contiguous axial images were obtained from the base of the skull through the vertex without intravenous contrast. COMPARISON:  2015 FINDINGS: Brain: There is no acute intracranial hemorrhage, mass effect, or edema. Gray-white differentiation is preserved. Ventricles and sulci are normal in size and configuration. No extra-axial collection. Vascular: No hyperdense vessel. Skull: Unremarkable. Sinuses/Orbits: Maxillary sinus retention cyst. Orbits are unremarkable. Other: Mastoid air cells are clear. ASPECTS ([place] Stroke Program Early CT Score) - Ganglionic level infarction (caudate, lentiform nuclei,  internal capsule, insula, M1-M3 cortex): 7 - Supraganglionic infarction (M4-M6 cortex): 3 Total score (0-10 with 10 being normal): 10 IMPRESSION: There is no acute intracranial hemorrhage or evidence of acute infarction. ASPECT score is 10. These results were called by telephone at the time of interpretation on 07/11/2020 at 9:04 am to provider Adele Milson , who verbally acknowledged these results. Electronically Signed   By: Macy Mis M.D.   On: 07/11/2020 09:05    Procedures .Critical Care Performed by: Eustaquio Maize, PA-C Authorized by: Eustaquio Maize, PA-C   Critical care provider statement:    Critical care time (minutes):  45   Critical care was time spent personally by me on the following activities:  Discussions with consultants, evaluation of patient's response to treatment, examination of patient, ordering and performing treatments and interventions, ordering and review of laboratory studies, ordering and review of radiographic studies, pulse oximetry, re-evaluation of patient's condition, obtaining history from patient or surrogate and review of old charts     Medications Ordered in ED Medications  clopidogrel (PLAVIX) tablet 75 mg (has no administration in time range)  LORazepam (ATIVAN) injection 0-4 mg (has no administration in time range)    Or  LORazepam (ATIVAN) tablet 0-4  mg (has no administration in time range)  LORazepam (ATIVAN) injection 0-4 mg (has no administration in time range)    Or  LORazepam (ATIVAN) tablet 0-4 mg (has no administration in time range)  thiamine tablet 100 mg (has no administration in time range)    Or  thiamine (B-1) injection 100 mg (has no administration in time range)  clopidogrel (PLAVIX) tablet 300 mg (300 mg Oral Given 07/11/20 0940)  gadobutrol (GADAVIST) 1 MMOL/ML injection 10 mL (10 mLs Intravenous Contrast Given 07/11/20 1211)    ED Course  I have reviewed the triage vital signs and the nursing notes.  Pertinent labs &  imaging results that were available during my care of the patient were reviewed by me and considered in my medical decision making (see chart for details).  Clinical Course as of 07/11/20 1320  Mon Jul 11, 2020  1037 SARS Coronavirus 2 by RT PCR: NEGATIVE [MV]    Clinical Course User Index [MV] Eustaquio Maize, PA-C   MDM Rules/Calculators/A&P                          55 year old male who presents to the ED today complaining of sudden onset left arm numbness that began around 7:30 AM this morning.  Woke up 30 minutes prior without symptoms.  Has had fatigue and lightheadedness for about 5 days, family is recovering from [place] however patient has had several negative at home and PCR COVID tests.  Arrival to the ED today vitals are stable.  Patient afebrile, nontachycardic and nontachypneic.  Blood pressure stable at 135/84.  Initially placed in fast-track room for COVID exposure however as I enter room patient states he is here for his left arm numbness.  On my exam he does have some slight decrease sensation to the left upper extremity.  There is no weakness.  No other focal neurodeficits on exam today however he is still in the stroke window, code stroke called at 8:44 AM.   Received call from radiologist; CT Head clear. 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 Rogene Houston received call from teleneurologist Dr. Leonel Ramsay who recommends MRI Brain/MRA Head and Neck. Symptoms are very mild and do not qualify for tPA at this time however pt will need further workup. Question TIA. Will need admission to [facility]. Please see consult note for further recommendations.   CBC without leukocytosis. Hgb stable at 15.1 CMP with glucose 169, BUN 33. Creatinine stable at 1.19. AST and ALT and T bili slightly elevated  Hepatic Function Latest Ref Rng & Units 07/11/2020  10/29/2013 06/28/2009  Total Protein 6.5 - 8.1 g/dL 8.0 7.8 6.7  Albumin 3.5 - 5.0 g/dL 4.3 4.4 4.1  AST 15 - 41 U/L 109(H) 46(H) 25  ALT 0 - 44 U/L 70(H) 49 54(H)  Alk Phosphatase 38 - 126 U/L 121 96 72  Total Bilirubin 0.3 - 1.2 mg/dL 1.4(H) 3.3(H) 1.4(H)   EtOH elevated today at 76. Question hx of alcohol abuse given elevated LFTs.   MRI without acute stroke at this time. Does show  2. Possible 7 mm cerebral cavernous venous malformation in the right  parietal lobe (versus prior hemorrhage) without evidence of edema or  acute hemorrhage.   On reevaluation pt resting comfortably however now tachycardic. Reports numbness has resolved. He does endorse heavy EtOH  use and states he drank yesterday afternoon. Concern for possible withdrawal at this time. CIWA protocol placed. Will call for admission  Discussed case with [facility] Dr. Neysa Bonito who agrees to accept patient for admission  This note was prepared using Dragon voice recognition software and may include unintentional dictation errors due to the inherent limitations of voice recognition software.   Final Clinical Impression(s) / ED Diagnoses Final diagnoses:  TIA (transient ischemic attack)  Alcohol abuse  Elevated LFTs    Rx / DC Orders ED Discharge Orders    None       Eustaquio Maize, PA-C 07/11/20 1320    Fredia Sorrow, MD 07/11/20 1556

## 2020-07-11 NOTE — ED Provider Notes (Signed)
I provided a substantive portion of the care of this patient.  I personally performed the entirety of the history, exam and medical decision making for this encounter. CRITICAL CARE Performed by: Vanetta Mulders Total critical care time: 35 minutes Critical care time was exclusive of separately billable procedures and treating other patients. Critical care was necessary to treat or prevent imminent or life-threatening deterioration. Critical care was time spent personally by me on the following activities: development of treatment plan with patient and/or surrogate as well as nursing, discussions with consultants, evaluation of patient's response to treatment, examination of patient, obtaining history from patient or surrogate, ordering and performing treatments and interventions, ordering and review of laboratory studies, ordering and review of radiographic studies, pulse oximetry and re-evaluation of patient's condition.  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  Not feeling well the past 2 days.  More fatigue.  No real respiratory symptoms.  Patient with acute onset of left arm numbness at 730 this morning.  Patient denies any chest pain any abdominal pain any shortness of breath any visual changes any speech problems.  Or any weakness.  Based on this code stroke was initiated based on patient's minimal symptoms probably not tPA candidate.  Seen by telemetry neurologist Dr. Amada Jupiter.  Head CT without any acute findings.  Neurology felt there was a little bit of minimal ataxia but patient could walk well.  We  did observe patient walking well back to the recess room.  They are recommending MRA head neck with and without.  And probable admission to [facility] for TIA.  Patient's EKG had some right bundle branch left anterior fascicular block changes.  Based on this we got troponins.  But again patient has no chest pain.  Troponins are pending.  Labs are significant for an alcohol of 76.  INR is normal at 1.2.  And PTT is normal as well.  Liver function tests had some slight elevation in the liver enzymes.  And total bili was 1.4.  Is possible patient may have an alcohol problem.  The numbness in the left upper extremity was present.  According to neurology is improving.  And they did also note the slight gait disturbance.   Vanetta Mulders, MD 07/11/20 [phone]

## 2020-07-11 NOTE — ED Notes (Signed)
Pt ambulated to restroom unassisted with steady gait. Denied dizziness with change of position

## 2020-07-11 NOTE — ED Triage Notes (Signed)
Patient reports that his son and wife have had Covid and he has had 3 negative home tests and [facility] test was negative as well.  Patient reports that he is feeling tired and weak x 5 days.   Patient also reports some numbness in his left arm 30 after waking, but states much better now.

## 2020-07-12 ENCOUNTER — Encounter (HOSPITAL_COMMUNITY): Payer: Self-pay | Admitting: Internal Medicine

## 2020-07-12 ENCOUNTER — Observation Stay (HOSPITAL_BASED_OUTPATIENT_CLINIC_OR_DEPARTMENT_OTHER): Payer: 59

## 2020-07-12 DIAGNOSIS — G459 Transient cerebral ischemic attack, unspecified: Secondary | ICD-10-CM | POA: Diagnosis not present

## 2020-07-12 DIAGNOSIS — I10 Essential (primary) hypertension: Secondary | ICD-10-CM | POA: Diagnosis not present

## 2020-07-12 DIAGNOSIS — R200 Anesthesia of skin: Secondary | ICD-10-CM

## 2020-07-12 DIAGNOSIS — R7989 Other specified abnormal findings of blood chemistry: Secondary | ICD-10-CM | POA: Diagnosis not present

## 2020-07-12 LAB — COMPREHENSIVE METABOLIC PANEL
ALT: 66 U/L — ABNORMAL HIGH (ref 0–44)
AST: 105 U/L — ABNORMAL HIGH (ref 15–41)
Albumin: 3.8 g/dL (ref 3.5–5.0)
Alkaline Phosphatase: 100 U/L (ref 38–126)
Anion gap: 9 (ref 5–15)
BUN: 26 mg/dL — ABNORMAL HIGH (ref 6–20)
CO2: 24 mmol/L (ref 22–32)
Calcium: 9.3 mg/dL (ref 8.9–10.3)
Chloride: 101 mmol/L (ref 98–111)
Creatinine, Ser: 1.03 mg/dL (ref 0.61–1.24)
GFR, Estimated: >60 mL/min (ref >60)
Glucose, Bld: 126 mg/dL — ABNORMAL HIGH (ref 70–99)
Potassium: 3.8 mmol/L (ref 3.5–5.1)
Sodium: 134 mmol/L — ABNORMAL LOW (ref 135–145)
Total Bilirubin: 3 mg/dL — ABNORMAL HIGH (ref 0.3–1.2)
Total Protein: 7.1 g/dL (ref 6.5–8.1)

## 2020-07-12 LAB — LIPID PANEL
Cholesterol: 157 mg/dL (ref 0–200)
HDL: 46 mg/dL (ref >40)
LDL Cholesterol: UNDETERMINED mg/dL (ref 0–99)
Total CHOL/HDL Ratio: 3.4 RATIO
Triglycerides: 447 mg/dL — ABNORMAL HIGH (ref <150)
VLDL: UNDETERMINED mg/dL (ref 0–40)

## 2020-07-12 LAB — CBC
HCT: 39.9 % (ref 39.0–52.0)
Hemoglobin: 13.7 g/dL (ref 13.0–17.0)
MCH: 34.6 pg — ABNORMAL HIGH (ref 26.0–34.0)
MCHC: 34.3 g/dL (ref 30.0–36.0)
MCV: 100.8 fL — ABNORMAL HIGH (ref 80.0–100.0)
Platelets: 163 10*3/uL (ref 150–400)
RBC: 3.96 MIL/uL — ABNORMAL LOW (ref 4.22–5.81)
RDW: 11.3 % — ABNORMAL LOW (ref 11.5–15.5)
WBC: 8.4 10*3/uL (ref 4.0–10.5)
nRBC: 0 % (ref 0.0–0.2)

## 2020-07-12 LAB — ECHOCARDIOGRAM COMPLETE
Area-P 1/2: 1.76 cm2
Height: 75 in
S' Lateral: 3.2 cm
Weight: 4000 oz

## 2020-07-12 LAB — LDL CHOLESTEROL, DIRECT: Direct LDL: 53.2 mg/dL (ref 0–99)

## 2020-07-12 MED ORDER — ATORVASTATIN CALCIUM 80 MG PO TABS: 80.0000 mg | ORAL_TABLET | Freq: Every day | ORAL | 0 refills | Status: DC

## 2020-07-12 MED ORDER — AMLODIPINE BESYLATE 10 MG PO TABS: 10.0000 mg | ORAL_TABLET | Freq: Every evening | ORAL | Status: DC

## 2020-07-12 MED ORDER — CLOPIDOGREL BISULFATE 75 MG PO TABS: 75.0000 mg | ORAL_TABLET | Freq: Every day | ORAL | 0 refills | Status: AC

## 2020-07-12 MED ORDER — ATORVASTATIN CALCIUM 10 MG PO TABS
10.0000 mg | ORAL_TABLET | Freq: Every day | ORAL | Status: DC
Start: 2020-07-12 — End: 2020-07-12

## 2020-07-12 NOTE — Progress Notes (Signed)
  Echocardiogram 2D Echocardiogram has been performed.  Augustine Radar 07/12/2020, 11:06 AM

## 2020-07-12 NOTE — ED Notes (Signed)
Report called to Kayla, RN at [facility]. 

## 2020-07-12 NOTE — Discharge Summary (Addendum)
Physician Discharge Summary  Jesse Baker:[id number] DOB: [birth year] DOA: 07/11/2020  PCP: Donita Brooks, MD  Admit date: 07/11/2020 Discharge date: 07/12/2020  Discharge disposition: Home   Recommendations for Outpatient Follow-Up:   Follow-up with PCP in 1 week Follow-up with neurologist in 2 to 3 weeks.   Discharge Diagnosis:   Principal Problem:   TIA (transient ischemic attack) Active Problems:   Essential hypertension, benign   Hyperlipidemia   Left arm numbness   Alcohol dependence with uncomplicated withdrawal (HCC)   Elevated LFTs    Discharge Condition: Stable.  Diet recommendation:  Diet Order            Diet heart healthy/carb modified Room service appropriate? Yes; Fluid consistency: Thin  Diet effective now           Diet - low sodium heart healthy                   Code Status: Full Code     Hospital Course:   Mr. 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  He presented to the hospital because of generalized weakness and fatigue of 5 days duration and left arm numbness that lasted about an hour.  He was admitted to the hospital because of concern for acute stroke.  However, neuroimaging including CT head, MRI brain, MRA head and neck, did not show any acute abnormality or stroke.  Initially, patient was scheduled to transfer to [facility] for further work-up.  However, his stroke work-up has been completed and all his symptoms had resolved.  Case was discussed with neurologist on-call, Dr. Bing Neighbors, and tele neurologist, Dr. Amada Jupiter.  From their standpoint, patient could be discharged home today on dual antiplatelet therapy with aspirin and Plavix for 3 weeks followed by aspirin monotherapy.  Outpatient follow-up was recommended.   Patient feels back to his baseline and is deemed stable for discharge to home today.  Transfer to [facility] was canceled.  Outpatient follow-up of thyroid nodule recommended as well.  Discharge plan was discussed with the patient and his father at the bedside in detail.  They verbalized understanding and agreed with the plan.  Medical Consultants:    Neurologist   Discharge Exam:    Vitals:   07/12/20 1030 07/12/20 1100 07/12/20 1102 07/12/20 1130  BP: (!) 152/96 (!) 144/111  129/82  Pulse: 88 (!) 48 92 92  Resp: (!) 22 (!) 26 15 15   Temp:      TempSrc:      SpO2: 98% 96% 97% 96%  Weight:      Height:         GEN: NAD SKIN: Warm and dry EYES: EOMI ENT: MMM CV: RRR PULM: CTA B ABD: soft, ND, NT, +BS CNS: AAO x 3, non focal EXT: No edema or tenderness   The results of significant diagnostics from this hospitalization (including imaging, microbiology, ancillary and laboratory) are listed below for reference.     Procedures and Diagnostic Studies:   MR ANGIO HEAD WO CONTRAST  Result Date: 07/11/2020 CLINICAL DATA:  Concern for stroke. EXAM: MRI HEAD WITHOUT CONTRAST MRA HEAD WITHOUT CONTRAST MRA NECK WITHOUT AND WITH CONTRAST TECHNIQUE: Multiplanar, multi-echo pulse sequences of the brain and surrounding structures were acquired without intravenous contrast. Angiographic images of the Circle of Willis were acquired  using MRA technique without intravenous contrast. Angiographic images of the neck were acquired using MRA technique without and with intravenous contrast. Carotid stenosis measurements (when applicable) are obtained utilizing NASCET criteria, using the distal internal carotid diameter as the denominator. CONTRAST:  62mL GADAVIST GADOBUTROL 1 MMOL/ML IV SOLN COMPARISON:  Same day CT head. FINDINGS: MRI HEAD FINDINGS Brain: No acute infarction, acute hemorrhage, hydrocephalus, extra-axial collection or mass lesion. No pathologic intracranial enhancement. Focus  of susceptibility artifact (approximately 7 mm) in the right parietal lobe without edema. There is suggestion of possible draining vein in this region and slight T2 hypointensity, suggesting this may represent a cavernous malformation. No correlate hyperdensity on the same day head CT. Vascular: See below. Skull and upper cervical spine: Normal marrow signal. Sinuses/Orbits: Retention cysts and bilateral maxillary sinuses and the right sphenoid sinus. No acute orbital abnormality. Other: No mastoid effusions. MRA HEAD FINDINGS Motion limited evaluation.  Within this limitation: Anterior circulation: No large vessel occlusion or proximal hemodynamically significant stenosis. Small left posterior communicating artery infundibulum without visible aneurysm. Posterior circulation: Patent bilateral intradural vertebral arteries with small left vertebral artery and basilar artery. Bilateral posterior communicating arteries with fetal type left PCA, anatomic variant. Small, but patent bilateral posterior cerebral arteries proximally. Distal PCA evaluation is limited due to small size and motion. MRA NECK FINDINGS Motion limited evaluation.  Within this limitation: Aortic arch: Great vessel origins are patent. Right carotid system: No visible hemodynamically significant (greater than 50%) stenosis. Left carotid system: No visible hemodynamically significant (greater than 50%) stenosis. Vertebral arteries: Right dominant. Patent right vertebral artery without visible hemodynamically significant (greater than 50%) stenosis. The left vertebral artery is small throughout its course, limiting evaluation (particularly at its origin). Other: Heterogeneous thyroid with suspected left thyroid nodule. IMPRESSION: MRI: 1. No evidence of acute intracranial abnormality. Specifically, no acute infarct. 2. 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 MRA head: 1. Motion limited evaluation without large vessel occlusion or visible proximal hemodynamically significant stenosis. 2. Heterogeneous thyroid with suspected left thyroid nodule. Consider nonurgent thyroid ultrasound to further characterize. Electronically Signed   By: Feliberto Harts MD   On: 07/11/2020 13:03   MR Angiogram Neck W or Wo Contrast  Result Date: 07/11/2020 CLINICAL DATA:  Concern for stroke. EXAM: MRI HEAD WITHOUT CONTRAST MRA HEAD WITHOUT CONTRAST MRA NECK WITHOUT AND WITH CONTRAST TECHNIQUE: Multiplanar, multi-echo pulse sequences of the brain and surrounding structures were acquired without intravenous contrast. Angiographic images of the Circle of Willis were acquired using MRA technique without intravenous contrast. Angiographic images of the neck were acquired using MRA technique without and with intravenous contrast. Carotid stenosis measurements (when applicable) are obtained utilizing NASCET criteria, using the distal internal carotid diameter as the denominator. CONTRAST:  51mL GADAVIST GADOBUTROL 1 MMOL/ML IV SOLN COMPARISON:  Same day CT head. FINDINGS: MRI HEAD FINDINGS Brain: No acute infarction, acute hemorrhage, hydrocephalus, extra-axial collection or mass lesion. No pathologic intracranial enhancement. Focus of susceptibility artifact (approximately 7 mm) in the right parietal lobe without edema. There is suggestion of possible draining vein in this region and slight T2 hypointensity, suggesting this may represent a cavernous malformation. No correlate hyperdensity on the same day head CT. Vascular: See below. Skull and upper cervical spine: Normal marrow signal. Sinuses/Orbits: Retention cysts and bilateral maxillary sinuses and the right sphenoid sinus. No acute orbital abnormality. Other: No mastoid effusions. MRA HEAD FINDINGS Motion limited  evaluation.  Within this limitation: Anterior circulation: No large vessel occlusion or proximal  hemodynamically significant stenosis. Small left posterior communicating artery infundibulum without visible aneurysm. Posterior circulation: Patent bilateral intradural vertebral arteries with small left vertebral artery and basilar artery. Bilateral posterior communicating arteries with fetal type left PCA, anatomic variant. Small, but patent bilateral posterior cerebral arteries proximally. Distal PCA evaluation is limited due to small size and motion. MRA NECK FINDINGS Motion limited evaluation.  Within this limitation: Aortic arch: Great vessel origins are patent. Right carotid system: No visible hemodynamically significant (greater than 50%) stenosis. Left carotid system: No visible hemodynamically significant (greater than 50%) stenosis. Vertebral arteries: Right dominant. Patent right vertebral artery without visible hemodynamically significant (greater than 50%) stenosis. The left vertebral artery is small throughout its course, limiting evaluation (particularly at its origin). Other: Heterogeneous thyroid with suspected left thyroid nodule. IMPRESSION: MRI: 1. No evidence of acute intracranial abnormality. Specifically, no acute infarct. 2. 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 MRA head: 1. Motion limited evaluation without large vessel occlusion or visible proximal hemodynamically significant stenosis. 2. Heterogeneous thyroid with suspected left thyroid nodule. Consider nonurgent thyroid ultrasound to further characterize. Electronically Signed   By: Feliberto HartsFrederick S Jones MD   On: 07/11/2020 13:03   MR BRAIN WO CONTRAST  Result Date: 07/11/2020 CLINICAL DATA:  Concern for stroke. EXAM: MRI HEAD WITHOUT CONTRAST MRA HEAD WITHOUT CONTRAST MRA NECK WITHOUT AND WITH CONTRAST TECHNIQUE: Multiplanar, multi-echo pulse sequences of the brain and surrounding structures were acquired without  intravenous contrast. Angiographic images of the Circle of Willis were acquired using MRA technique without intravenous contrast. Angiographic images of the neck were acquired using MRA technique without and with intravenous contrast. Carotid stenosis measurements (when applicable) are obtained utilizing NASCET criteria, using the distal internal carotid diameter as the denominator. CONTRAST:  10mL GADAVIST GADOBUTROL 1 MMOL/ML IV SOLN COMPARISON:  Same day CT head. FINDINGS: MRI HEAD FINDINGS Brain: No acute infarction, acute hemorrhage, hydrocephalus, extra-axial collection or mass lesion. No pathologic intracranial enhancement. Focus of susceptibility artifact (approximately 7 mm) in the right parietal lobe without edema. There is suggestion of possible draining vein in this region and slight T2 hypointensity, suggesting this may represent a cavernous malformation. No correlate hyperdensity on the same day head CT. Vascular: See below. Skull and upper cervical spine: Normal marrow signal. Sinuses/Orbits: Retention cysts and bilateral maxillary sinuses and the right sphenoid sinus. No acute orbital abnormality. Other: No mastoid effusions. MRA HEAD FINDINGS Motion limited evaluation.  Within this limitation: Anterior circulation: No large vessel occlusion or proximal hemodynamically significant stenosis. Small left posterior communicating artery infundibulum without visible aneurysm. Posterior circulation: Patent bilateral intradural vertebral arteries with small left vertebral artery and basilar artery. Bilateral posterior communicating arteries with fetal type left PCA, anatomic variant. Small, but patent bilateral posterior cerebral arteries proximally. Distal PCA evaluation is limited due to small size and motion. MRA NECK FINDINGS Motion limited evaluation.  Within this limitation: Aortic arch: Great vessel origins are patent. Right carotid system: No visible hemodynamically significant (greater than 50%)  stenosis. Left carotid system: No visible hemodynamically significant (greater than 50%) stenosis. Vertebral arteries: Right dominant. Patent right vertebral artery without visible hemodynamically significant (greater than 50%) stenosis. The left vertebral artery is small throughout its course, limiting evaluation (particularly at its origin). Other: Heterogeneous thyroid with suspected left thyroid nodule. IMPRESSION: MRI: 1. No evidence of acute intracranial abnormality.  Specifically, no acute infarct. 2. 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 MRA head: 1. Motion limited evaluation without large vessel occlusion or visible proximal hemodynamically significant stenosis. 2. Heterogeneous thyroid with suspected left thyroid nodule. Consider nonurgent thyroid ultrasound to further characterize. Electronically Signed   By: Feliberto Harts MD   On: 07/11/2020 13:03   ECHOCARDIOGRAM COMPLETE  Result Date: 07/12/2020    ECHOCARDIOGRAM REPORT   Patient Name:   Jesse Baker Date of Exam: 07/12/2020 Medical Rec #:  [id number]       Height:       75.0 in Accession #:    [id number]      Weight:       250.0 lb Date of Birth:  1965-09-24        BSA:          2.413 m Patient Age:    54 years        BP:           172/104 mmHg Patient Gender: M               HR:           83 bpm. Exam Location:  Inpatient Procedure: 2D Echo, Cardiac Doppler and Color Doppler Indications:    TIA  History:        Patient has prior history of Echocardiogram examinations, most                 recent 07/23/2019. Risk Factors:Hypertension and Dyslipidemia.  Sonographer:    Eulah Pont RDCS Referring Phys: [id number] JARED E SEGAL IMPRESSIONS  1. Left ventricular ejection fraction, by estimation, is 60 to 65%. The left ventricle has normal function. The left ventricle has no regional wall motion abnormalities. Left ventricular diastolic  parameters are consistent with Grade I diastolic dysfunction (impaired relaxation).  2. Right ventricular systolic function is normal. The right ventricular size is normal.  3. The mitral valve is normal in structure. No evidence of mitral valve regurgitation. No evidence of mitral stenosis.  4. The aortic valve is grossly normal. Aortic valve regurgitation is not visualized. No aortic stenosis is present.  5. The inferior vena cava is normal in size with greater than 50% respiratory variability, suggesting right atrial pressure of 3 mmHg. Comparison(s): No significant change from prior study. Prior images reviewed side by side. Left ventricular hypertrophy was not confirmed on the current study. FINDINGS  Left Ventricle: Left ventricular ejection fraction, by estimation, is 60 to 65%. The left ventricle has normal function. The left ventricle has no regional wall motion abnormalities. The left ventricular internal cavity size was normal in size. There is  no left ventricular hypertrophy. Left ventricular diastolic parameters are consistent with Grade I diastolic dysfunction (impaired relaxation). Normal left ventricular filling pressure. Right Ventricle: The right ventricular size is normal. No increase in right ventricular wall thickness. Right ventricular systolic function is normal. Left Atrium: Left atrial size was normal in size. Right Atrium: Right atrial size was normal in size. Pericardium: There is no evidence of pericardial effusion. Mitral Valve: The mitral valve is normal in structure. No evidence of mitral valve regurgitation. No evidence of mitral valve stenosis. Tricuspid Valve: The tricuspid valve is normal in structure. Tricuspid valve regurgitation is not demonstrated. No evidence of tricuspid stenosis. Aortic Valve: The aortic valve is grossly normal. Aortic valve regurgitation is not visualized. No aortic stenosis is  present. Pulmonic Valve: The pulmonic valve was normal in structure. Pulmonic  valve regurgitation is not visualized. No evidence of pulmonic stenosis. Aorta: The aortic root is normal in size and structure. Venous: The inferior vena cava is normal in size with greater than 50% respiratory variability, suggesting right atrial pressure of 3 mmHg. IAS/Shunts: No atrial level shunt detected by color flow Doppler.  LEFT VENTRICLE PLAX 2D LVIDd:         4.70 cm  Diastology LVIDs:         3.20 cm  LV e' medial:    6.98 cm/s LV PW:         1.00 cm  LV E/e' medial:  6.6 LV IVS:        1.00 cm  LV e' lateral:   10.50 cm/s LVOT diam:     2.10 cm  LV E/e' lateral: 4.4 LV SV:         66 LV SV Index:   27 LVOT Area:     3.46 cm  RIGHT VENTRICLE RV S prime:     9.73 cm/s TAPSE (M-mode): 1.3 cm LEFT ATRIUM             Index       RIGHT ATRIUM          Index LA diam:        3.20 cm 1.33 cm/m  RA Area:     8.00 cm LA Vol (A2C):   30.9 ml 12.81 ml/m RA Volume:   11.80 ml 4.89 ml/m LA Vol (A4C):   30.6 ml 12.68 ml/m LA Biplane Vol: 31.6 ml 13.10 ml/m  AORTIC VALVE LVOT Vmax:   105.00 cm/s LVOT Vmean:  70.300 cm/s LVOT VTI:    0.190 m  AORTA Ao Root diam: 3.30 cm Ao Asc diam:  4.00 cm MITRAL VALVE MV Area (PHT): 1.76 cm    SHUNTS MV Decel Time: 430 msec    Systemic VTI:  0.19 m MV E velocity: 46.40 cm/s  Systemic Diam: 2.10 cm MV A velocity: 66.40 cm/s MV E/A ratio:  0.70 Mihai Croitoru MD Electronically signed by Thurmon Fair MD Signature Date/Time: 07/12/2020/11:06:18 AM    Final    CT HEAD CODE STROKE WO CONTRAST  Result Date: 07/11/2020 CLINICAL DATA:  Code stroke.  Left arm weakness EXAM: CT HEAD WITHOUT CONTRAST TECHNIQUE: Contiguous axial images were obtained from the base of the skull through the vertex without intravenous contrast. COMPARISON:  2015 FINDINGS: Brain: There is no acute intracranial hemorrhage, mass effect, or edema. Gray-white differentiation is preserved. Ventricles and sulci are normal in size and configuration. No extra-axial collection. Vascular: No hyperdense vessel. Skull:  Unremarkable. Sinuses/Orbits: Maxillary sinus retention cyst. Orbits are unremarkable. Other: Mastoid air cells are clear. ASPECTS ([place]ta Stroke Program Early CT Score) - Ganglionic level infarction (caudate, lentiform nuclei, internal capsule, insula, M1-M3 cortex): 7 - Supraganglionic infarction (M4-M6 cortex): 3 Total score (0-10 with 10 being normal): 10 IMPRESSION: There is no acute intracranial hemorrhage or evidence of acute infarction. ASPECT score is 10. These results were called by telephone at the time of interpretation on 07/11/2020 at 9:04 am to provider MARGAUX VENTER , who verbally acknowledged these results. Electronically Signed   By: Guadlupe Spanish M.D.   On: 07/11/2020 09:05     Labs:   Basic Metabolic Panel: Recent Labs  Lab 07/11/20 0843 07/11/20 0858 07/11/20 1413 07/12/20 0500  NA 138 140  --  134*  K 4.1 4.2  --  3.8  CL 104 106  --  101  CO2 23  --   --  24  GLUCOSE 169* 166*  --  126*  BUN 33* 31*  --  26*  CREATININE 1.19 1.20  --  1.03  CALCIUM 9.0  --   --  9.3  MG  --   --  2.0  --   PHOS  --   --  3.9  --    GFR Estimated Creatinine Clearance: 108.9 mL/min (by C-G formula based on SCr of 1.03 mg/dL). Liver Function Tests: Recent Labs  Lab 07/11/20 0843 07/12/20 0500  AST 109* 105*  ALT 70* 66*  ALKPHOS 121 100  BILITOT 1.4* 3.0*  PROT 8.0 7.1  ALBUMIN 4.3 3.8   No results for input(s): LIPASE, AMYLASE in the last 168 hours. No results for input(s): AMMONIA in the last 168 hours. Coagulation profile Recent Labs  Lab 07/11/20 0843  INR 1.2    CBC: Recent Labs  Lab 07/11/20 0843 07/11/20 0858 07/12/20 0500  WBC 10.0  --  8.4  NEUTROABS 7.5  --   --   HGB 15.1 15.6 13.7  HCT 44.7 46.0 39.9  MCV 101.1*  --  100.8*  PLT 246  --  163   Cardiac Enzymes: No results for input(s): CKTOTAL, CKMB, CKMBINDEX, TROPONINI in the last 168 hours. BNP: Invalid input(s): POCBNP CBG: Recent Labs  Lab 07/11/20 0902  GLUCAP 166*    D-Dimer No results for input(s): DDIMER in the last 72 hours. Hgb A1c No results for input(s): HGBA1C in the last 72 hours. Lipid Profile Recent Labs    07/12/20 0500  CHOL 157  HDL 46  LDLCALC UNABLE TO CALCULATE IF TRIGLYCERIDE OVER 400 mg/dL  TRIG 161*  CHOLHDL 3.4  LDLDIRECT 53.2   Thyroid function studies Recent Labs    07/11/20 1830  TSH 11.915*   Anemia work up No results for input(s): VITAMINB12, FOLATE, FERRITIN, TIBC, IRON, RETICCTPCT in the last 72 hours. Microbiology Recent Results (from the past 240 hour(s))  Resp Panel by RT-PCR (Flu A&B, Covid) Nasopharyngeal Swab     Status: None   Collection Time: 07/11/20  8:47 AM   Specimen: Nasopharyngeal Swab; Nasopharyngeal(NP) swabs in vial transport medium  Result Value Ref Range Status   SARS Coronavirus 2 by RT PCR NEGATIVE NEGATIVE Final    Comment: (NOTE) SARS-CoV-2 target nucleic acids are NOT DETECTED.  The SARS-CoV-2 RNA is generally detectable in upper respiratory specimens during the acute phase of infection. The lowest concentration of SARS-CoV-2 viral copies this assay can detect is 138 copies/mL. A negative result does not preclude SARS-Cov-2 infection and should not be used as the sole basis for treatment or other patient management decisions. A negative result may occur with  improper specimen collection/handling, submission of specimen other than nasopharyngeal swab, presence of viral mutation(s) within the areas targeted by this assay, and inadequate number of viral copies(<138 copies/mL). A negative result must be combined with clinical observations, patient history, and epidemiological information. The expected result is Negative.  Fact Sheet for Patients:  BloggerCourse.com  Fact Sheet for Healthcare Providers:  SeriousBroker.it  This test is no t yet approved or cleared by the [place] FDA and  has been authorized for detection  and/or diagnosis of SARS-CoV-2 by FDA under an Emergency Use Authorization (EUA). This EUA will remain  in effect (meaning this test can be used) for the duration of the COVID-19 declaration under Section 564(b)(1) of the Act,  21 U.S.C.section 360bbb-3(b)(1), unless the authorization is terminated  or revoked sooner.       Influenza A by PCR NEGATIVE NEGATIVE Final   Influenza B by PCR NEGATIVE NEGATIVE Final    Comment: (NOTE) The Xpert Xpress SARS-CoV-2/FLU/RSV plus assay is intended as an aid in the diagnosis of influenza from Nasopharyngeal swab specimens and should not be used as a sole basis for treatment. Nasal washings and aspirates are unacceptable for Xpert Xpress SARS-CoV-2/FLU/RSV testing.  Fact Sheet for Patients: BloggerCourse.com  Fact Sheet for Healthcare Providers: SeriousBroker.it  This test is not yet approved or cleared by the [place] FDA and has been authorized for detection and/or diagnosis of SARS-CoV-2 by FDA under an Emergency Use Authorization (EUA). This EUA will remain in effect (meaning this test can be used) for the duration of the COVID-19 declaration under Section 564(b)(1) of the Act, 21 U.S.C. section 360bbb-3(b)(1), unless the authorization is terminated or revoked.  Performed at [facility], [address], [place], [place] [address]      Discharge Instructions:   Discharge Instructions    Diet - low sodium heart healthy   Complete by: As directed    Increase activity slowly   Complete by: As directed      Allergies as of 07/12/2020   No Known Allergies     Medication List    STOP taking these medications   amoxicillin 500 MG capsule Commonly known as: AMOXIL   azithromycin 250 MG tablet Commonly known as: ZITHROMAX   chlorhexidine 0.12 % solution Commonly known as: PERIDEX   diclofenac 75 MG EC tablet Commonly known as: VOLTAREN    methocarbamol 500 MG tablet Commonly known as: ROBAXIN     TAKE these medications   amLODipine 10 MG tablet Commonly known as: NORVASC Take 1 tablet (10 mg total) by mouth every evening.   aspirin EC 81 MG tablet Take 81 mg by mouth daily.   atorvastatin 80 MG tablet Commonly known as: Lipitor Take 1 tablet (80 mg total) by mouth daily. What changed:   medication strength  how much to take   clopidogrel 75 MG tablet Commonly known as: PLAVIX Take 1 tablet (75 mg total) by mouth daily for 21 days. Start taking on: July 13, 2020   fluticasone 50 MCG/ACT nasal spray Commonly known as: FLONASE Place 2 sprays into both nostrils in the morning.   ibuprofen 200 MG tablet Commonly known as: ADVIL Take 400-800 mg by mouth every 6 (six) hours as needed for mild pain or headache.   loratadine 10 MG tablet Commonly known as: CLARITIN Take 10 mg by mouth daily.   losartan 100 MG tablet Commonly known as: COZAAR TAKE 1 TABLET BY MOUTH EVERY DAY What changed: when to take this   metoprolol succinate 100 MG 24 hr tablet Commonly known as: TOPROL-XL TAKE 1 TABLET BY MOUTH EVERY DAY   sildenafil 20 MG tablet Commonly known as: REVATIO Take 3-5 tablets 1 hour prior to sexual activity What changed:   how much to take  how to take this  when to take this  additional instructions       Follow-up Information     NEUROLOGY. Schedule an appointment as soon as possible for a visit in 2 week(s).   Contact information: [address] [place], [address] [place] [address] [phone]               Time coordinating discharge: 36 minutes  Signed:  Nickolai Rinks  Anton Cheramie  [facility] 07/12/2020, 1:01 PM   Pager on [email or web address]. If 7PM-7AM, please contact night-coverage at [email or web address]

## 2020-07-12 NOTE — Evaluation (Signed)
Occupational Therapy Evaluation Patient Details Name: Jesse Baker MRN: [id number] DOB: 05/18/1965 Today's Date: 07/12/2020    History of Present Illness This is a 55 year old male with past medical history of hypertension, hyperlipidemia, alcohol use who presented to the ED with generalized weakness, fatigue x5 days and left arm numbness on day of hospitalization. Imaging without evidence of acute intracranial event.   Clinical Impression   Patient evaluated by Occupational Therapy with no further acute OT needs identified. There are very slight differences between pt's dominant RT UE and pt's affected LUE including very slight difference in sensing light touch as well as weakness to LT finger extensors of 4-/5 in comparison to RT hand 4+/5.  Functionally, pt at baseline with ADLs. All education has been completed and the patient has no further questions.  See below for any follow-up Occupational Therapy or equipment needs. OT is signing off. Thank you for this referral.      Follow Up Recommendations  No OT follow up    Equipment Recommendations  None recommended by OT    Recommendations for Other Services       Precautions / Restrictions Precautions Precautions: Fall Precaution Comments: CIWA protocol. Restrictions Weight Bearing Restrictions: No      Mobility Bed Mobility Overal bed mobility: Independent               Patient Response: Cooperative  Transfers Overall transfer level: Independent                    Balance Overall balance assessment: Independent                                         ADL either performed or assessed with clinical judgement   ADL Overall ADL's : At baseline                                       General ADL Comments: Limited for ADLs due to ED room, however pt able to grossly demonstrate or simulate ADLs suffiently to show baseline function.     Vision Baseline Vision/History: No  visual deficits;Wears glasses Wears Glasses: Reading only Patient Visual Report: No change from baseline Vision Assessment?: No apparent visual deficits Additional Comments: Pt reports lightheadedness/dizziness not affecting vision, and denied visual changes.     Perception     Praxis      Pertinent Vitals/Pain Pain Assessment: 0-10 Pain Score: 3  Pain Location: Chronic back pain Pain Intervention(s): Limited activity within patient's tolerance;Monitored during session     Hand Dominance Right   Extremity/Trunk Assessment Upper Extremity Assessment Upper Extremity Assessment: RUE deficits/detail;LUE deficits/detail RUE Deficits / Details: AROM: WNL and symmetrical with LUE. Intact Proprioception and light touch. Intact rapid alternating movements, finger to thumb and finger to nose. Noted hand tremor. RUE Sensation: WNL RUE Coordination: WNL LUE Deficits / Details: AROM: WNL and symmetrical with RUE. Intact Proprioception and light touch although pt reports a slight diminished diffrerence in comparison to RUE. Intact rapid alternating movements, finger to thumb and finger to nose. Noted hand tremor and 4-/5 finger extension whereas RT finger extension 4+/5. LUE Sensation: WNL LUE Coordination: WNL   Lower Extremity Assessment Lower Extremity Assessment: Defer to PT evaluation   Cervical / Trunk Assessment Cervical / Trunk Assessment: Normal  Communication Communication Communication: No difficulties   Cognition Arousal/Alertness: Awake/alert Behavior During Therapy: WFL for tasks assessed/performed Overall Cognitive Status: Within Functional Limits for tasks assessed                                 General Comments: A&Ox4   General Comments  Pt with mild LOB (self corrected) on standing dynamic balance test with a strong anterior perturbation, however attempted same again and pt had no LOB.    Exercises Other Exercises Other Exercises: Pt educated on  basic LUE rehab to gain strength including hand weights, planks vs quad positioning on floor per pt comfort, and use of LUE ad lib in all activities.   Shoulder Instructions      Home Living Family/patient expects to be discharged to:: Private residence Living Arrangements: Spouse/significant other;Children (with wife and adult son.) Available Help at Discharge: Family Type of Home: House Home Access: Stairs to enter Secretary/administrator of Steps: 2-3 Entrance Stairs-Rails: None Home Layout: One level     Bathroom Shower/Tub: Chief Strategy Officer: Handicapped height     Home Equipment: Grab bars - tub/shower   Additional Comments: Wife on walker after knee surgery.      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        OT Problem List: Impaired UE functional use      OT Treatment/Interventions:      OT Goals(Current goals can be found in the care plan section) Acute Rehab OT Goals Patient Stated Goal: Pt anticipates return to all activities including work once medically cleared. OT Goal Formulation: With patient Potential to Achieve Goals: Good  OT Frequency:     Barriers to D/C:            Co-evaluation              AM-PAC OT "6 Clicks" Daily Activity     Outcome Measure Help from another person eating meals?: None Help from another person taking care of personal grooming?: None Help from another person toileting, which includes using toliet, bedpan, or urinal?: None Help from another person bathing (including washing, rinsing, drying)?: None Help from another person to put on and taking off regular upper body clothing?: None Help from another person to put on and taking off regular lower body clothing?: None 6 Click Score: 24   End of Session Nurse Communication: Mobility status  Activity Tolerance: Patient tolerated  treatment well Patient left: Other (comment) (Sitting edge of stretcher)  OT Visit Diagnosis: Dizziness and giddiness (R42)                Time: 3664-4034 OT Time Calculation (min): 19 min Charges:  OT General Charges $OT Visit: 1 Visit OT Evaluation $OT Eval Low Complexity: 1 Low  Estil Vallee, OT Acute Rehab Services Office: [phone] 07/12/2020  Theodoro Clock 07/12/2020, 9:09 AM

## 2020-07-12 NOTE — ED Notes (Signed)
Pt given meal tray.

## 2020-07-12 NOTE — ED Notes (Signed)
[facility] here to transport patient. Father is at bedside.

## 2020-07-12 NOTE — ED Notes (Signed)
ED TO INPATIENT HANDOFF REPORT  Name/Age/Gender Jesse Baker 55 y.o. male  Code Status    Code Status Orders  (From admission, onward)         Start     Ordered   07/11/20 1348  Full code  Continuous        07/11/20 1347        Code Status History    Date Active Date Inactive Code Status Order ID Comments User Context   07/11/2020 1311 07/11/2020 1347 Full Code [id number]  Tanda Rockers, PA-C ED   Advance Care Planning Activity      Home/SNF/Other Home  Chief Complaint Left arm numbness [R20.0]  Level of Care/Admitting Diagnosis ED Disposition    ED Disposition Condition Comment   Admit  Hospital Area: [facility] [[id number]] Level of Care: Telemetry Medical [104] May place patient in observation at [facility] or [facility] if equivalent level of care is available:: No Covid Evaluation: covid negative Diagnosis: Le ft arm numbness [409811] Admitting Physician: Jae Dire [[id number]] Attending Physician: Jae Dire [[id number]]       Medical History Past Medical History:  Diagnosis Date  . Allergy   . Hyperlipidemia   . Hypertension   . Seasonal allergies     Allergies No Known Allergies  IV Location/Drains/Wounds Patient Lines/Drains/Airways Status    Active Line/Drains/Airways    Name Placement date Placement time Site Days   Peripheral IV 07/11/20 18 G Right Antecubital 07/11/20  0852  Antecubital  1          Labs/Imaging Results for orders placed or performed during the hospital encounter of 07/11/20 (from the past 48 hour(s))  Ethanol     Status: Abnormal   Collection Time: 07/11/20  8:43 AM  Result Value Ref Range   Alcohol, Ethyl (B) 76 (H) <10 mg/dL    Comment: (NOTE) Lowest detectable limit for serum alcohol is 10 mg/dL.  For medical purposes only. Performed at [facility], [address], [place], [place] [address]   Protime-INR     Status: None   Collection Time: 07/11/20  8:43 AM   Result Value Ref Range   Prothrombin Time 15.1 11.4 - 15.2 seconds   INR 1.2 0.8 - 1.2    Comment: (NOTE) INR goal varies based on device and disease states. Performed at [facility], [address], [place], [place] [address]   APTT     Status: None   Collection Time: 07/11/20  8:43 AM  Result Value Ref Range   aPTT 32 24 - 36 seconds    Comment: Performed at [facility], [address] [place], [place] [address]  CBC     Status: Abnormal   Collection Time: 07/11/20  8:43 AM  Result Value Ref Range   WBC 10.0 4.0 - 10.5 K/uL   RBC 4.42 4.22 - 5.81 MIL/uL   Hemoglobin 15.1 13.0 - 17.0 g/dL   HCT 32.4 40.1 - 02.7 %   MCV 101.1 (H) 80.0 - 100.0 fL   MCH 34.2 (H) 26.0 - 34.0 pg   MCHC 33.8 30.0 - 36.0 g/dL   RDW 25.3 (L) 66.4 - 40.3 %   Platelets 246 150 - 400 K/uL   nRBC 0.0 0.0 - 0.2 %    Comment: Performed at [facility], [address] [place], [place] [address]  Differential     Status: None   Collection Time: 07/11/20  8:43 AM  Result Value Ref Range   Neutrophils Relative % 74 %   Neutro Abs 7.5 1.7 - 7.7 K/uL   Lymphocytes Relative 18 %   Lymphs Abs 1.8 0.7 - 4.0 K/uL   Monocytes Relative 6 %   Monocytes Absolute 0.6 0.1 - 1.0 K/uL   Eosinophils Relative 1 %   Eosinophils Absolute 0.1 0.0 - 0.5 K/uL   Basophils Relative 1 %   Basophils Absolute 0.1 0.0 - 0.1 K/uL   Immature Granulocytes 0 %   Abs Immature Granulocytes 0.03 0.00 - 0.07 K/uL    Comment: Performed at [facility], [address], [place], [place] [address]  Comprehensive metabolic panel     Status: Abnormal   Collection Time: 07/11/20  8:43 AM  Result Value Ref Range   Sodium 138 135 - 145 mmol/L   Potassium 4.1 3.5 - 5.1 mmol/L   Chloride 104 98 - 111 mmol/L   CO2 23 22 - 32 mmol/L   Glucose, Bld 169 (H) 70 - 99 mg/dL    Comment: Glucose reference range applies only to samples taken after fasting for at least 8  hours.   BUN 33 (H) 6 - 20 mg/dL   Creatinine, Ser 6.04 0.61 - 1.24 mg/dL   Calcium 9.0 8.9 - 54.0 mg/dL   Total Protein 8.0 6.5 - 8.1 g/dL   Albumin 4.3 3.5 - 5.0 g/dL   AST 981 (H) 15 - 41 U/L   ALT 70 (H) 0 - 44 U/L   Alkaline Phosphatase 121 38 - 126 U/L   Total Bilirubin 1.4 (H) 0.3 - 1.2 mg/dL   GFR, Estimated >19 >14 mL/min    Comment: (NOTE) Calculated using the CKD-EPI Creatinine Equation (2021)    Anion gap 11 5 - 15    Comment: Performed at [facility], [address], [place], [place] [address]  Troponin I (High Sensitivity)     Status: None   Collection Time: 07/11/20  8:43 AM  Result Value Ref Range   Troponin I (High Sensitivity) 5 <18 ng/L    Comment: (NOTE) Elevated high sensitivity troponin I (hsTnI) values and significant  changes across serial measurements may suggest ACS but many other  chronic and acute conditions are known to elevate hsTnI results.  Refer to the "Links" section for chest pain algorithms and additional  guidance. Performed at [facility], [address], [place], [place] [address]   Resp Panel by RT-PCR (Flu A&B, Covid) Nasopharyngeal Swab     Status: None   Collection Time: 07/11/20  8:47 AM   Specimen: Nasopharyngeal Swab; Nasopharyngeal(NP) swabs in vial transport medium  Result Value Ref Range   SARS Coronavirus 2 by RT PCR NEGATIVE NEGATIVE    Comment: (NOTE) SARS-CoV-2 target nucleic acids are NOT DETECTED.  The SARS-CoV-2 RNA is generally detectable in upper respiratory specimens during the acute phase of infection. The lowest concentration of SARS-CoV-2 viral copies this assay can detect is 138 copies/mL. A negative result does not preclude SARS-Cov-2 infection and should not be used as the sole basis for treatment or other patient management decisions. A negative result may occur with  improper specimen collection/handling, submission of specimen other than nasopharyngeal swab, presence  of viral mutation(s) within the areas targeted by this assay, and inadequate number of viral copies(<138 copies/mL). A negative result must be combined with clinical observations, patient history, and epidemiological information. The expected result is Negative.  Fact Sheet for Patients:  BloggerCourse.comhttps://www.fda.gov/media/152166/download  Fact Sheet for Healthcare Providers:  SeriousBroker.ithttps://www.fda.gov/media/152162/download  This test is no t yet approved or cleared by the [place] FDA and  has been authorized for detection and/or diagnosis of SARS-CoV-2 by FDA under an Emergency Use Authorization (EUA). This EUA will remain  in effect (meaning this test can be used) for the duration of the COVID-19 declaration under Section 564(b)(1) of the Act, 21 U.S.C.section 360bbb-3(b)(1), unless the authorization is terminated  or revoked sooner.       Influenza A by PCR NEGATIVE NEGATIVE   Influenza B by PCR NEGATIVE NEGATIVE    Comment: (NOTE) The Xpert Xpress SARS-CoV-2/FLU/RSV plus assay is intended as an aid in the diagnosis of influenza from Nasopharyngeal swab specimens and should not be used as a sole basis for treatment. Nasal washings and aspirates are unacceptable for Xpert Xpress SARS-CoV-2/FLU/RSV testing.  Fact Sheet for Patients: BloggerCourse.comhttps://www.fda.gov/media/152166/download  Fact Sheet for Healthcare Providers: SeriousBroker.ithttps://www.fda.gov/media/152162/download  This test is not yet approved or cleared by the [place] FDA and has been authorized for detection and/or diagnosis of SARS-CoV-2 by FDA under an Emergency Use Authorization (EUA). This EUA will remain in effect (meaning this test can be used) for the duration of the COVID-19 declaration under Section 564(b)(1) of the Act, 21 U.S.C. section 360bbb-3(b)(1), unless the authorization is terminated or revoked.  Performed at [facility], [address] [place], [place] [id number]   Dickie LaI-stat chem 8, ED      Status: Abnormal   Collection Time: 07/11/20  8:58 AM  Result Value Ref Range   Sodium 140 135 - 145 mmol/L   Potassium 4.2 3.5 - 5.1 mmol/L   Chloride 106 98 - 111 mmol/L   BUN 31 (H) 6 - 20 mg/dL   Creatinine, Ser 6.041.20 0.61 - 1.24 mg/dL   Glucose, Bld 540166 (H) 70 - 99 mg/dL    Comment: Glucose reference range applies only to samples taken after fasting for at least 8 hours.   Calcium, Ion 1.13 (L) 1.15 - 1.40 mmol/L   TCO2 22 22 - 32 mmol/L   Hemoglobin 15.6 13.0 - 17.0 g/dL   HCT 98.146.0 19.139.0 - 47.852.0 %  CBG monitoring, ED     Status: Abnormal   Collection Time: 07/11/20  9:02 AM  Result Value Ref Range   Glucose-Capillary 166 (H) 70 - 99 mg/dL    Comment: Glucose reference range applies only to samples taken after fasting for at least 8 hours.  Urine rapid drug screen (hosp performed)     Status: None   Collection Time: 07/11/20  9:53 AM  Result Value Ref Range   Opiates NONE DETECTED NONE DETECTED   Cocaine NONE DETECTED NONE DETECTED   Benzodiazepines NONE DETECTED NONE DETECTED   Amphetamines NONE DETECTED NONE DETECTED   Tetrahydrocannabinol NONE DETECTED NONE DETECTED   Barbiturates NONE DETECTED NONE DETECTED    Comment: (NOTE) DRUG SCREEN FOR MEDICAL PURPOSES ONLY.  IF CONFIRMATION IS NEEDED FOR ANY PURPOSE, NOTIFY LAB WITHIN 5 DAYS.  LOWEST DETECTABLE LIMITS FOR URINE DRUG SCREEN Drug Class                     Cutoff (ng/mL) Amphetamine and metabolites    1000 Barbiturate and metabolites    200 Benzodiazepine                 200 Tricyclics and metabolites     300 Opiates and metabolites        300  Cocaine and metabolites        300 THC                            50 Performed at [facility], [address], [place], [place] [address]   Urinalysis, Routine w reflex microscopic Urine, Clean Catch     Status: None   Collection Time: 07/11/20  9:54 AM  Result Value Ref Range   Color, Urine YELLOW YELLOW   APPearance CLEAR CLEAR   Specific  Gravity, Urine 1.024 1.005 - 1.030   pH 5.0 5.0 - 8.0   Glucose, UA NEGATIVE NEGATIVE mg/dL   Hgb urine dipstick NEGATIVE NEGATIVE   Bilirubin Urine NEGATIVE NEGATIVE   Ketones, ur NEGATIVE NEGATIVE mg/dL   Protein, ur NEGATIVE NEGATIVE mg/dL   Nitrite NEGATIVE NEGATIVE   Leukocytes,Ua NEGATIVE NEGATIVE    Comment: Performed at [facility], [address] [place], [place] [address]  Troponin I (High Sensitivity)     Status: None   Collection Time: 07/11/20  1:00 PM  Result Value Ref Range   Troponin I (High Sensitivity) 5 <18 ng/L    Comment: (NOTE) Elevated high sensitivity troponin I (hsTnI) values and significant  changes across serial measurements may suggest ACS but many other  chronic and acute conditions are known to elevate hsTnI results.  Refer to the "Links" section for chest pain algorithms and additional  guidance. Performed at [facility], [address] [place], [place] [address]   HIV Antibody (routine testing w rflx)     Status: None   Collection Time: 07/11/20  2:13 PM  Result Value Ref Range   HIV Screen 4th Generation wRfx Non Reactive Non Reactive    Comment: Performed at [facility], [address] [place], [place] [address]  Magnesium     Status: None   Collection Time: 07/11/20  2:13 PM  Result Value Ref Range   Magnesium 2.0 1.7 - 2.4 mg/dL    Comment: Performed at [facility], [address] [place], [place] [address]  Phosphorus     Status: None   Collection Time: 07/11/20  2:13 PM  Result Value Ref Range   Phosphorus 3.9 2.5 - 4.6 mg/dL    Comment: Performed at [facility], [address] [place], [place] [address]  TSH     Status: Abnormal   Collection Time: 07/11/20  6:30 PM  Result Value Ref Range   TSH 11.915 (H) 0.350 - 4.500 uIU/mL    Comment: Performed by a 3rd Generation assay with a functional sensitivity of <=0.01 uIU/mL. Performed at [facility], [address], [place], [place] [address]   CBC     Status: Abnormal   Collection Time: 07/12/20  5:00 AM  Result Value Ref Range   WBC 8.4 4.0 - 10.5 K/uL   RBC 3.96 (L) 4.22 - 5.81 MIL/uL   Hemoglobin 13.7 13.0 - 17.0 g/dL   HCT 25.3 66.4 - 40.3 %   MCV 100.8 (H) 80.0 - 100.0 fL   MCH 34.6 (H) 26.0 - 34.0 pg   MCHC 34.3 30.0 - 36.0 g/dL   RDW 47.4 (L) 25.9 - 56.3 %   Platelets 163 150 - 400 K/uL   nRBC 0.0 0.0 - 0.2 %    Comment: Performed at [facility], [address] [place], [place] [address]  MR ANGIO HEAD WO CONTRAST  Result Date: 07/11/2020 CLINICAL DATA:  Concern for stroke. EXAM: MRI HEAD WITHOUT CONTRAST MRA HEAD WITHOUT CONTRAST MRA NECK WITHOUT AND WITH CONTRAST TECHNIQUE: Multiplanar, multi-echo pulse sequences of the brain and surrounding structures were acquired without intravenous contrast. Angiographic images of the Circle of Willis were acquired using MRA technique without intravenous contrast. Angiographic images of the neck were acquired using MRA technique without and with intravenous contrast. Carotid stenosis measurements (when applicable) are obtained utilizing NASCET criteria, using the distal internal carotid diameter as the denominator. CONTRAST:  72mL GADAVIST GADOBUTROL 1 MMOL/ML IV SOLN COMPARISON:  Same day CT head. FINDINGS: MRI HEAD FINDINGS Brain: No acute infarction, acute hemorrhage, hydrocephalus, extra-axial collection or mass lesion. No pathologic intracranial enhancement. Focus of susceptibility artifact (approximately 7 mm) in the right parietal lobe without edema. There is suggestion of possible draining vein in this region and slight T2 hypointensity, suggesting this may represent a cavernous malformation. No correlate hyperdensity on the same day head CT. Vascular: See below. Skull and upper cervical spine: Normal marrow signal. Sinuses/Orbits: Retention cysts and bilateral maxillary sinuses and the right  sphenoid sinus. No acute orbital abnormality. Other: No mastoid effusions. MRA HEAD FINDINGS Motion limited evaluation.  Within this limitation: Anterior circulation: No large vessel occlusion or proximal hemodynamically significant stenosis. Small left posterior communicating artery infundibulum without visible aneurysm. Posterior circulation: Patent bilateral intradural vertebral arteries with small left vertebral artery and basilar artery. Bilateral posterior communicating arteries with fetal type left PCA, anatomic variant. Small, but patent bilateral posterior cerebral arteries proximally. Distal PCA evaluation is limited due to small size and motion. MRA NECK FINDINGS Motion limited evaluation.  Within this limitation: Aortic arch: Great vessel origins are patent. Right carotid system: No visible hemodynamically significant (greater than 50%) stenosis. Left carotid system: No visible hemodynamically significant (greater than 50%) stenosis. Vertebral arteries: Right dominant. Patent right vertebral artery without visible hemodynamically significant (greater than 50%) stenosis. The left vertebral artery is small throughout its course, limiting evaluation (particularly at its origin). Other: Heterogeneous thyroid with suspected left thyroid nodule. IMPRESSION: MRI: 1. No evidence of acute intracranial abnormality. Specifically, no acute infarct. 2. 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 MRA head: 1. Motion limited evaluation without large vessel occlusion or visible proximal hemodynamically significant stenosis. 2. Heterogeneous thyroid with suspected left thyroid nodule. Consider nonurgent thyroid ultrasound to further characterize. Electronically Signed   By: Feliberto Harts MD   On: 07/11/2020 13:03   MR Angiogram Neck W or Wo Contrast  Result Date: 07/11/2020 CLINICAL DATA:  Concern for  stroke. EXAM: MRI HEAD WITHOUT CONTRAST MRA HEAD WITHOUT CONTRAST MRA NECK WITHOUT AND WITH CONTRAST TECHNIQUE: Multiplanar, multi-echo pulse sequences of the brain and surrounding structures were acquired without intravenous contrast. Angiographic images of the Circle of Willis were acquired using MRA technique without intravenous contrast. Angiographic images of the neck were acquired using MRA technique without and with intravenous contrast. Carotid stenosis measurements (when applicable) are obtained utilizing NASCET criteria, using the distal internal carotid diameter as the denominator. CONTRAST:  55mL GADAVIST GADOBUTROL 1 MMOL/ML IV SOLN COMPARISON:  Same day CT head. FINDINGS: MRI HEAD FINDINGS Brain: No acute infarction, acute hemorrhage, hydrocephalus, extra-axial collection or mass lesion. No pathologic intracranial enhancement. Focus of susceptibility artifact (approximately 7 mm) in the right parietal lobe without edema. There is suggestion of possible draining vein in this region and  slight T2 hypointensity, suggesting this may represent a cavernous malformation. No correlate hyperdensity on the same day head CT. Vascular: See below. Skull and upper cervical spine: Normal marrow signal. Sinuses/Orbits: Retention cysts and bilateral maxillary sinuses and the right sphenoid sinus. No acute orbital abnormality. Other: No mastoid effusions. MRA HEAD FINDINGS Motion limited evaluation.  Within this limitation: Anterior circulation: No large vessel occlusion or proximal hemodynamically significant stenosis. Small left posterior communicating artery infundibulum without visible aneurysm. Posterior circulation: Patent bilateral intradural vertebral arteries with small left vertebral artery and basilar artery. Bilateral posterior communicating arteries with fetal type left PCA, anatomic variant. Small, but patent bilateral posterior cerebral arteries proximally. Distal PCA evaluation is limited due to small  size and motion. MRA NECK FINDINGS Motion limited evaluation.  Within this limitation: Aortic arch: Great vessel origins are patent. Right carotid system: No visible hemodynamically significant (greater than 50%) stenosis. Left carotid system: No visible hemodynamically significant (greater than 50%) stenosis. Vertebral arteries: Right dominant. Patent right vertebral artery without visible hemodynamically significant (greater than 50%) stenosis. The left vertebral artery is small throughout its course, limiting evaluation (particularly at its origin). Other: Heterogeneous thyroid with suspected left thyroid nodule. IMPRESSION: MRI: 1. No evidence of acute intracranial abnormality. Specifically, no acute infarct. 2. 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 MRA head: 1. Motion limited evaluation without large vessel occlusion or visible proximal hemodynamically significant stenosis. 2. Heterogeneous thyroid with suspected left thyroid nodule. Consider nonurgent thyroid ultrasound to further characterize. Electronically Signed   By: Feliberto Harts MD   On: 07/11/2020 13:03   MR BRAIN WO CONTRAST  Result Date: 07/11/2020 CLINICAL DATA:  Concern for stroke. EXAM: MRI HEAD WITHOUT CONTRAST MRA HEAD WITHOUT CONTRAST MRA NECK WITHOUT AND WITH CONTRAST TECHNIQUE: Multiplanar, multi-echo pulse sequences of the brain and surrounding structures were acquired without intravenous contrast. Angiographic images of the Circle of Willis were acquired using MRA technique without intravenous contrast. Angiographic images of the neck were acquired using MRA technique without and with intravenous contrast. Carotid stenosis measurements (when applicable) are obtained utilizing NASCET criteria, using the distal internal carotid diameter as the denominator. CONTRAST:  91mL GADAVIST GADOBUTROL 1 MMOL/ML IV SOLN COMPARISON:   Same day CT head. FINDINGS: MRI HEAD FINDINGS Brain: No acute infarction, acute hemorrhage, hydrocephalus, extra-axial collection or mass lesion. No pathologic intracranial enhancement. Focus of susceptibility artifact (approximately 7 mm) in the right parietal lobe without edema. There is suggestion of possible draining vein in this region and slight T2 hypointensity, suggesting this may represent a cavernous malformation. No correlate hyperdensity on the same day head CT. Vascular: See below. Skull and upper cervical spine: Normal marrow signal. Sinuses/Orbits: Retention cysts and bilateral maxillary sinuses and the right sphenoid sinus. No acute orbital abnormality. Other: No mastoid effusions. MRA HEAD FINDINGS Motion limited evaluation.  Within this limitation: Anterior circulation: No large vessel occlusion or proximal hemodynamically significant stenosis. Small left posterior communicating artery infundibulum without visible aneurysm. Posterior circulation: Patent bilateral intradural vertebral arteries with small left vertebral artery and basilar artery. Bilateral posterior communicating arteries with fetal type left PCA, anatomic variant. Small, but patent bilateral posterior cerebral arteries proximally. Distal PCA evaluation is limited due to small size and motion. MRA NECK FINDINGS Motion limited evaluation.  Within this limitation: Aortic arch: Great vessel origins are patent. Right carotid system: No visible hemodynamically significant (greater than 50%) stenosis. Left carotid system: No visible  hemodynamically significant (greater than 50%) stenosis. Vertebral arteries: Right dominant. Patent right vertebral artery without visible hemodynamically significant (greater than 50%) stenosis. The left vertebral artery is small throughout its course, limiting evaluation (particularly at its origin). Other: Heterogeneous thyroid with suspected left thyroid nodule. IMPRESSION: MRI: 1. No evidence of acute  intracranial abnormality. Specifically, no acute infarct. 2. 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 MRA head: 1. Motion limited evaluation without large vessel occlusion or visible proximal hemodynamically significant stenosis. 2. Heterogeneous thyroid with suspected left thyroid nodule. Consider nonurgent thyroid ultrasound to further characterize. Electronically Signed   By: Feliberto Harts MD   On: 07/11/2020 13:03   CT HEAD CODE STROKE WO CONTRAST  Result Date: 07/11/2020 CLINICAL DATA:  Code stroke.  Left arm weakness EXAM: CT HEAD WITHOUT CONTRAST TECHNIQUE: Contiguous axial images were obtained from the base of the skull through the vertex without intravenous contrast. COMPARISON:  2015 FINDINGS: Brain: There is no acute intracranial hemorrhage, mass effect, or edema. Gray-white differentiation is preserved. Ventricles and sulci are normal in size and configuration. No extra-axial collection. Vascular: No hyperdense vessel. Skull: Unremarkable. Sinuses/Orbits: Maxillary sinus retention cyst. Orbits are unremarkable. Other: Mastoid air cells are clear. ASPECTS (Alberta Stroke Program Early CT Score) - Ganglionic level infarction (caudate, lentiform nuclei, internal capsule, insula, M1-M3 cortex): 7 - Supraganglionic infarction (M4-M6 cortex): 3 Total score (0-10 with 10 being normal): 10 IMPRESSION: There is no acute intracranial hemorrhage or evidence of acute infarction. ASPECT score is 10. These results were called by telephone at the time of interpretation on 07/11/2020 at 9:04 am to provider MARGAUX VENTER , who verbally acknowledged these results. Electronically Signed   By: Guadlupe Spanish M.D.   On: 07/11/2020 09:05    Pending Labs Unresulted Labs (From admission, onward)          Start     Ordered   07/12/20 0500  Hemoglobin A1c  (Labs)  Tomorrow morning,   R        07/11/20  1347   07/12/20 0500  Lipid panel  (Labs)  Tomorrow morning,   R       Comments: Fasting    07/11/20 1347   07/12/20 0500  Comprehensive metabolic panel  Tomorrow morning,   R       Question:  Specimen collection method  Answer:  Lab=Lab collect   07/11/20 1401          Vitals/Pain Today's Vitals   07/12/20 0138 07/12/20 0139 07/12/20 0303 07/12/20 0330  BP: (!) 149/92 (!) 149/92 (!) 142/99 125/68  Pulse: 93  87 81  Resp: 18   15  Temp:      TempSrc:      SpO2: 98%   96%  Weight:      Height:      PainSc:        Isolation Precautions No active isolations  Medications Medications  clopidogrel (PLAVIX) tablet 75 mg (has no administration in time range)  LORazepam (ATIVAN) injection 0-4 mg (2 mg Intravenous Given 07/12/20 0304)    Or  LORazepam (ATIVAN) tablet 0-4 mg ( Oral See Alternative 07/12/20 0304)  LORazepam (ATIVAN) injection 0-4 mg (has no administration in time range)    Or  LORazepam (ATIVAN) tablet 0-4 mg (has no administration in time range)  thiamine tablet 100 mg ( Oral See Alternative 07/11/20 1403)    Or  thiamine (B-1)  injection 100 mg (100 mg Intravenous Given 07/11/20 1403)  aspirin EC tablet 81 mg (has no administration in time range)  amLODipine (NORVASC) tablet 10 mg (10 mg Oral Patient Refused/Not Given 07/11/20 1650)  losartan (COZAAR) tablet 100 mg (100 mg Oral Given 07/11/20 1646)  metoprolol succinate (TOPROL-XL) 24 hr tablet 100 mg (100 mg Oral Patient Refused/Not Given 07/11/20 1651)   stroke: mapping our early stages of recovery book (has no administration in time range)  enoxaparin (LOVENOX) injection 40 mg (40 mg Subcutaneous Given 07/11/20 2313)  0.9 %  sodium chloride infusion (has no administration in time range)  folic acid (FOLVITE) tablet 1 mg (1 mg Oral Given 07/11/20 1647)  multivitamin with minerals tablet 1 tablet (1 tablet Oral Given 07/11/20 1647)  atorvastatin (LIPITOR) tablet 10 mg (10 mg Oral Given 07/11/20 1855)  clopidogrel (PLAVIX) tablet  300 mg (300 mg Oral Given 07/11/20 0940)  gadobutrol (GADAVIST) 1 MMOL/ML injection 10 mL (10 mLs Intravenous Contrast Given 07/11/20 1211)    Mobility walks

## 2020-07-12 NOTE — ED Notes (Signed)
Bed placement made aware patient no longer going to bed at [facility].

## 2020-07-12 NOTE — ED Notes (Signed)
Unable to give report at this time. Inpatient RN providing patient care.

## 2020-07-12 NOTE — ED Notes (Signed)
[facility] called for transport. 

## 2020-07-13 LAB — HEMOGLOBIN A1C
Hgb A1c MFr Bld: 5.4 % (ref 4.8–5.6)
Mean Plasma Glucose: 108 mg/dL

## 2020-07-18 ENCOUNTER — Telehealth: Payer: Self-pay | Admitting: Internal Medicine

## 2020-07-18 DIAGNOSIS — G459 Transient cerebral ischemic attack, unspecified: Secondary | ICD-10-CM

## 2020-07-18 NOTE — Telephone Encounter (Signed)
Neurology referral sent for TIA.     Thad Ranger M.D.  [facility] 07/18/2020, 10:58 AM
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
## 2020-08-11 ENCOUNTER — Encounter: Payer: Self-pay | Admitting: Neurology

## 2020-08-11 ENCOUNTER — Other Ambulatory Visit: Payer: Self-pay

## 2020-08-11 ENCOUNTER — Ambulatory Visit (INDEPENDENT_AMBULATORY_CARE_PROVIDER_SITE_OTHER): Payer: 59 | Admitting: Neurology

## 2020-08-11 VITALS — BP 118/82 | HR 73 | Ht 75.0 in | Wt 240.4 lb

## 2020-08-11 DIAGNOSIS — E782 Mixed hyperlipidemia: Secondary | ICD-10-CM

## 2020-08-11 DIAGNOSIS — I10 Essential (primary) hypertension: Secondary | ICD-10-CM

## 2020-08-11 DIAGNOSIS — Z79899 Other long term (current) drug therapy: Secondary | ICD-10-CM

## 2020-08-11 DIAGNOSIS — F10230 Alcohol dependence with withdrawal, uncomplicated: Secondary | ICD-10-CM

## 2020-08-11 DIAGNOSIS — G459 Transient cerebral ischemic attack, unspecified: Secondary | ICD-10-CM | POA: Diagnosis not present

## 2020-08-11 MED ORDER — CLOPIDOGREL BISULFATE 75 MG PO TABS: 75.0000 mg | ORAL_TABLET | Freq: Every day | ORAL | 3 refills | Status: DC

## 2020-08-11 MED ORDER — ATORVASTATIN CALCIUM 80 MG PO TABS: 80.0000 mg | ORAL_TABLET | Freq: Every day | ORAL | 3 refills | Status: DC

## 2020-08-11 NOTE — Patient Instructions (Signed)
Stop aspirin.  Continue clopidogrel 75mg  daily Continue atorvastatin 80mg  daily - repeat lipid panel with direct LDL in 2 months. Continue metoprolol Mediterranean diet (see below) Routine exercise Follow up 6 months  Mediterranean Diet A Mediterranean diet refers to food and lifestyle choices that are based on the traditions of countries located on the . This way of eating has been shown to help prevent certain conditions and improve outcomes forpeople who have chronic diseases, like kidney disease and heart disease. What are tips for following this plan? Lifestyle Cook and eat meals together with your family, when possible. Drink enough fluid to keep your urine clear or pale yellow. Be physically active every day. This includes: Aerobic exercise like running or swimming. Leisure activities like gardening, walking, or housework. Get 7-8 hours of sleep each night. If recommended by your health care provider, drink red wine in moderation. This means 1 glass a day for nonpregnant women and 2 glasses a day for men. A glass of wine equals 5 oz (150 mL). Reading food labels  Check the serving size of packaged foods. For foods such as rice and pasta, the serving size refers to the amount of cooked product, not dry. Check the total fat in packaged foods. Avoid foods that have saturated fat or trans fats. Check the ingredients list for added sugars, such as corn syrup.  Shopping At the grocery store, buy most of your food from the areas near the walls of the store. This includes: Fresh fruits and vegetables (produce). Grains, beans, nuts, and seeds. Some of these may be available in unpackaged forms or large amounts (in bulk). Fresh seafood. Poultry and eggs. Low-fat dairy products. Buy whole ingredients instead of prepackaged foods. Buy fresh fruits and vegetables in-season from local farmers markets. Buy frozen fruits and vegetables in resealable bags. If you do not have  access to quality fresh seafood, buy precooked frozen shrimp or canned fish, such as tuna, salmon, or sardines. Buy small amounts of raw or cooked vegetables, salads, or olives from the deli or salad bar at your store. Stock your pantry so you always have certain foods on hand, such as olive oil, canned tuna, canned tomatoes, rice, pasta, and beans. Cooking Cook foods with extra-virgin olive oil instead of using butter or other vegetable oils. Have meat as a side dish, and have vegetables or grains as your main dish. This means having meat in small portions or adding small amounts of meat to foods like pasta or stew. Use beans or vegetables instead of meat in common dishes like chili or lasagna. Experiment with different cooking methods. Try roasting or broiling vegetables instead of steaming or sauteing them. Add frozen vegetables to soups, stews, pasta, or rice. Add nuts or seeds for added healthy fat at each meal. You can add these to yogurt, salads, or vegetable dishes. Marinate fish or vegetables using olive oil, lemon juice, garlic, and fresh herbs. Meal planning  Plan to eat 1 vegetarian meal one day each week. Try to work up to 2 vegetarian meals, if possible. Eat seafood 2 or more times a week. Have healthy snacks readily available, such as: Vegetable sticks with hummus. Greek yogurt. Fruit and nut trail mix. Eat balanced meals throughout the week. This includes: Fruit: 2-3 servings a day Vegetables: 4-5 servings a day Low-fat dairy: 2 servings a day Fish, poultry, or lean meat: 1 serving a day Beans and legumes: 2 or more servings a week Nuts and seeds: 1-2 servings a day  Whole grains: 6-8 servings a day Extra-virgin olive oil: 3-4 servings a day Limit red meat and sweets to only a few servings a month  What are my food choices? Mediterranean diet Recommended Grains: Whole-grain pasta. Brown rice. Bulgar wheat. Polenta. Couscous. Whole-wheat bread. Orpah Cobb. Vegetables: Artichokes. Beets. Broccoli. Cabbage. Carrots. Eggplant. Green beans. Chard. Kale. Spinach. Onions. Leeks. Peas. Squash. Tomatoes. Peppers. Radishes. Fruits: Apples. Apricots. Avocado. Berries. Bananas. Cherries. Dates. Figs. Grapes. Lemons. Melon. Oranges. Peaches. Plums. Pomegranate. Meats and other protein foods: Beans. Almonds. Sunflower seeds. Pine nuts. Peanuts. Cod. Salmon. Scallops. Shrimp. Tuna. Tilapia. Clams. Oysters. Eggs. Dairy: Low-fat milk. Cheese. Greek yogurt. Beverages: Water. Red wine. Herbal tea. Fats and oils: Extra virgin olive oil. Avocado oil. Grape seed oil. Sweets and desserts: [place] yogurt with honey. Baked apples. Poached pears. Trail mix. Seasoning and other foods: Basil. Cilantro. Coriander. Cumin. Mint. Parsley. Sage. Rosemary. Tarragon. Garlic. Oregano. Thyme. Pepper. Balsalmic vinegar. Tahini. Hummus. Tomato sauce. Olives. Mushrooms. Limit these Grains: Prepackaged pasta or rice dishes. Prepackaged cereal with added sugar. Vegetables: Deep fried potatoes (french fries). Fruits: Fruit canned in syrup. Meats and other protein foods: Beef. Pork. Lamb. Poultry with skin. Hot dogs. Tomasa Blase. Dairy: Ice cream. Sour cream. Whole milk. Beverages: Juice. Sugar-sweetened soft drinks. Beer. Liquor and spirits. Fats and oils: Butter. Canola oil. Vegetable oil. Beef fat (tallow). Lard. Sweets and desserts: Cookies. Cakes. Pies. Candy. Seasoning and other foods: Mayonnaise. Premade sauces and marinades. The items listed may not be a complete list. Talk with your dietitian aboutwhat dietary choices are right for you. Summary The Mediterranean diet includes both food and lifestyle choices. Eat a variety of fresh fruits and vegetables, beans, nuts, seeds, and whole grains. Limit the amount of red meat and sweets that you eat. Talk with your health care provider about whether it is safe for you to drink red wine in moderation. This means 1 glass a day for  nonpregnant women and 2 glasses a day for men. A glass of wine equals 5 oz (150 mL). This information is not intended to replace advice given to you by your health care provider. Make sure you discuss any questions you have with your healthcare provider. Document Revised: 09/22/2015 Document Reviewed: 09/15/2015 Elsevier Patient Education  2020 [employer].

## 2020-08-26 ENCOUNTER — Other Ambulatory Visit: Payer: Self-pay | Admitting: Interventional Cardiology

## 2020-09-18 ENCOUNTER — Other Ambulatory Visit: Payer: Self-pay | Admitting: Interventional Cardiology

## 2020-11-09 ENCOUNTER — Other Ambulatory Visit: Payer: Self-pay | Admitting: Neurology

## 2020-11-14 NOTE — Progress Notes (Signed)
Cardiology Office Note   Date:  11/15/2020   ID:  Blade, Scheff Feb 08, 1965, MRN [id number]  PCP:  Donita Brooks, MD    No chief complaint on file.  HTN  Wt Readings from Last 3 Encounters:  11/15/20 255 lb (115.7 kg)  08/11/20 240 lb 6.4 oz (109 kg)  07/11/20 250 lb (113.4 kg)       History of Present Illness: Jesse Baker is a 55 y.o. male  who has had HTN for more than 10 years.     In the past, he has had issues with maintaining a healthy diet and regular exercise.    Patient with palpitations which prompted a trip to the emergency room on Jun 30, 2019.  He was found to have sinus tachycardia.  He had negative troponins and a negative D-dimer.  He was sent home for further work-up as an outpatient.   In 2021, history from the ER as follows: "Had sinus infection last week-congestion, was not seen, improved now"   Has had sinus tachycardia, with plans for 14 day Zio patch if sx return.    Since the last visit, he has done well.  BP has been controlled.  HR has well.   Has chronic back pain which limits exercise.    He had both COVID vaccinesWater engineer.   In June 2022, he went in for arm numbness.  THey diagnosed TIA and started Plavix.  He stopped drinking at that time. TSH was elevated but synthroid was not started.   BP has improved after he stopped drinking.  BPs are in the 110-120s/80 in general since then.   Not exercising due to back pain.     Past Medical History:  Diagnosis Date   Allergy    Hyperlipidemia    Hypertension    Seasonal allergies     Past Surgical History:  Procedure Laterality Date   BACK SURGERY     2012, lumbar discectomy      Current Outpatient Medications  Medication Sig Dispense Refill   amLODipine (NORVASC) 10 MG tablet TAKE 1 TABLET BY MOUTH EVERY DAY 90 tablet 2   atorvastatin (LIPITOR) 80 MG tablet TAKE 1 TABLET BY MOUTH EVERY DAY 90 tablet 0   clopidogrel (PLAVIX) 75 MG tablet Take 1 tablet (75 mg total)  by mouth daily. 30 tablet 3   fluticasone (FLONASE) 50 MCG/ACT nasal spray Place 2 sprays into both nostrils in the morning.     ibuprofen (ADVIL) 200 MG tablet Take 400-800 mg by mouth every 6 (six) hours as needed for mild pain or headache.     loratadine (CLARITIN) 10 MG tablet Take 10 mg by mouth daily.     losartan (COZAAR) 100 MG tablet TAKE 1 TABLET BY MOUTH EVERY DAY 90 tablet 2   metoprolol succinate (TOPROL-XL) 100 MG 24 hr tablet TAKE 1 TABLET BY MOUTH EVERY DAY 90 tablet 2   sildenafil (REVATIO) 20 MG tablet Take 3-5 tablets 1 hour prior to sexual activity (Patient taking differently: Take 60-100 mg by mouth See admin instructions. Take 60-100 mg by mouth one hour prior to sexual activity) 30 tablet 11   No current facility-administered medications for this visit.    Allergies:   Patient has no known allergies.    Social History:  The patient  reports that he has quit smoking. He has never used smokeless tobacco. He reports current alcohol use. He reports that he does not use drugs.  Family History:  The patient's family history includes Cancer in his paternal grandfather; Heart attack in his paternal grandmother; Hypertension in his mother; Parkinsonism in his paternal grandmother.    ROS:  Please see the history of present illness.   Otherwise, review of systems are positive for back pain.   All other systems are reviewed and negative.    PHYSICAL EXAM: VS:  BP 126/82   Pulse 75   Ht 6\' 3"  (1.905 m)   Wt 255 lb (115.7 kg)   SpO2 98%   BMI 31.87 kg/m  , BMI Body mass index is 31.87 kg/m. 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, RBBB, LAFB   Recent Labs: 07/11/2020: Magnesium 2.0; TSH 11.915 07/12/2020: ALT 66; BUN 26; Creatinine, Ser 1.03; Hemoglobin 13.7; Platelets 163; Potassium 3.8; Sodium 134   Lipid Panel    Component Value Date/Time   CHOL 157 07/12/2020 0500   CHOL 139 07/23/2019 0807   TRIG 447 (H) 07/12/2020 0500   HDL 46 07/12/2020 0500   HDL 55 07/23/2019 0807   CHOLHDL 3.4 07/12/2020 0500   VLDL UNABLE TO CALCULATE IF TRIGLYCERIDE OVER 400 mg/dL 09/11/2020 16/11/9602   LDLCALC UNABLE TO CALCULATE IF TRIGLYCERIDE OVER 400 mg/dL 5409 81/19/1478   LDLCALC 60 07/23/2019 0807   LDLDIRECT 53.2 07/12/2020 0500     Other studies Reviewed: Additional studies/ records that were reviewed today with results demonstrating: labs reviewed.     ASSESSMENT AND PLAN:  HTN: The current medical regimen is effective;  continue present plan and medications.  Readings have improved significantly since he stopped drinking.  Try to find activity that we will not bother his joints and back. DOE: resolved.  Thyroid supplementation may help with weight loss and thus his overall stamina level. Hyperlipidemia: TG increased. He has since stopped alcohol. Continue atorvastatin.   Palpitations: resolved. Obesity: lost weight with stopping alcohol, but gained it back. May be related to hypothyroid. Start Synthroid 50 mcg daily.   Elevated blood sugar: A1C 5.4   Current medicines are reviewed at length with the patient today.  The patient concerns regarding his medicines were addressed.  The following changes have been made:  No change  Labs/ tests ordered today include:  No orders of the defined types were placed in this encounter.   Recommend 150 minutes/week of aerobic exercise Low fat, low carb, high fiber diet recommended  Disposition:   FU 1 year; will share blood test results with primary care doctor as he will need ongoing  management of his hypothyroidism.   Signed, 09/11/2020, MD  11/15/2020 4:22 PM    [facility] [address] [place], [place], [place]  [place] Phone: [phone]; Fax: [phone]

## 2020-11-15 ENCOUNTER — Other Ambulatory Visit: Payer: Self-pay

## 2020-11-15 ENCOUNTER — Ambulatory Visit: Payer: 59 | Admitting: Interventional Cardiology

## 2020-11-15 ENCOUNTER — Encounter: Payer: Self-pay | Admitting: Interventional Cardiology

## 2020-11-15 VITALS — BP 126/82 | HR 75 | Ht 75.0 in | Wt 255.0 lb

## 2020-11-15 DIAGNOSIS — Z79899 Other long term (current) drug therapy: Secondary | ICD-10-CM

## 2020-11-15 DIAGNOSIS — I10 Essential (primary) hypertension: Secondary | ICD-10-CM | POA: Diagnosis not present

## 2020-11-15 DIAGNOSIS — R739 Hyperglycemia, unspecified: Secondary | ICD-10-CM | POA: Diagnosis not present

## 2020-11-15 DIAGNOSIS — E669 Obesity, unspecified: Secondary | ICD-10-CM | POA: Diagnosis not present

## 2020-11-15 DIAGNOSIS — R7989 Other specified abnormal findings of blood chemistry: Secondary | ICD-10-CM

## 2020-11-15 DIAGNOSIS — E782 Mixed hyperlipidemia: Secondary | ICD-10-CM | POA: Diagnosis not present

## 2020-11-15 MED ORDER — LEVOTHYROXINE SODIUM 50 MCG PO TABS: 50.0000 ug | ORAL_TABLET | Freq: Every day | ORAL | 0 refills | Status: DC

## 2020-11-15 NOTE — Patient Instructions (Signed)
Medication Instructions:  Your physician has recommended you make the following change in your medication: Start levothyroxine 50 mcg by mouth daily.  Take one hour before breakfast  *If you need a refill on your cardiac medications before your next appointment, please call your pharmacy*   Lab Work: Your physician recommends that you return for lab work on December 27, 2020.  CMET, Lipids and TSH.  This will be fasting.  The lab opens at 7:30 AM  If you have labs (blood work) drawn today and your tests are completely normal, you will receive your results only by: MyChart Message (if you have MyChart) OR A paper copy in the mail If you have any lab test that is abnormal or we need to change your treatment, we will call you to review the results.   Testing/Procedures: none   Follow-Up: At [facility], you and your health needs are our priority.  As part of our continuing mission to provide you with exceptional heart care, we have created designated Provider Care Teams.  These Care Teams include your primary Cardiologist (physician) and Advanced Practice Providers (APPs -  Physician Assistants and Nurse Practitioners) who all work together to provide you with the care you need, when you need it.  We recommend signing up for the patient portal called "MyChart".  Sign up information is provided on this After Visit Summary.  MyChart is used to connect with patients for Virtual Visits (Telemedicine).  Patients are able to view lab/test results, encounter notes, upcoming appointments, etc.  Non-urgent messages can be sent to your provider as well.   To learn more about what you can do with MyChart, go to [email or web address].    Your next appointment:   6 month(s)  The format for your next appointment:   In Person  Provider:   You may see Lance Muss, MD or one of the following Advanced Practice Providers on your designated Care Team:   Ronie Spies, PA-C Jacolyn Reedy,  PA-C   Other Instructions

## 2020-11-27 ENCOUNTER — Other Ambulatory Visit: Payer: Self-pay | Admitting: Neurology

## 2020-12-27 ENCOUNTER — Other Ambulatory Visit: Payer: 59 | Admitting: *Deleted

## 2020-12-27 ENCOUNTER — Other Ambulatory Visit: Payer: Self-pay

## 2020-12-27 DIAGNOSIS — E669 Obesity, unspecified: Secondary | ICD-10-CM

## 2020-12-27 DIAGNOSIS — R739 Hyperglycemia, unspecified: Secondary | ICD-10-CM

## 2020-12-27 DIAGNOSIS — R7989 Other specified abnormal findings of blood chemistry: Secondary | ICD-10-CM

## 2020-12-27 DIAGNOSIS — Z79899 Other long term (current) drug therapy: Secondary | ICD-10-CM

## 2020-12-27 DIAGNOSIS — I10 Essential (primary) hypertension: Secondary | ICD-10-CM

## 2020-12-27 DIAGNOSIS — E782 Mixed hyperlipidemia: Secondary | ICD-10-CM

## 2020-12-27 LAB — COMPREHENSIVE METABOLIC PANEL
ALT: 26 IU/L (ref 0–44)
AST: 20 IU/L (ref 0–40)
Albumin/Globulin Ratio: 1.8 (ref 1.2–2.2)
Albumin: 4.4 g/dL (ref 3.8–4.9)
Alkaline Phosphatase: 168 IU/L — ABNORMAL HIGH (ref 44–121)
BUN/Creatinine Ratio: 18 (ref 9–20)
BUN: 16 mg/dL (ref 6–24)
Bilirubin Total: 1.2 mg/dL (ref 0.0–1.2)
CO2: 25 mmol/L (ref 20–29)
Calcium: 9.8 mg/dL (ref 8.7–10.2)
Chloride: 105 mmol/L (ref 96–106)
Creatinine, Ser: 0.91 mg/dL (ref 0.76–1.27)
Globulin, Total: 2.4 g/dL (ref 1.5–4.5)
Glucose: 117 mg/dL — ABNORMAL HIGH (ref 70–99)
Potassium: 4.2 mmol/L (ref 3.5–5.2)
Sodium: 143 mmol/L (ref 134–144)
Total Protein: 6.8 g/dL (ref 6.0–8.5)
eGFR: 98 mL/min/{1.73_m2} (ref >59)

## 2020-12-27 LAB — LIPID PANEL
Chol/HDL Ratio: 3.4 ratio (ref 0.0–5.0)
Cholesterol, Total: 124 mg/dL (ref 100–199)
HDL: 37 mg/dL — ABNORMAL LOW (ref >39)
LDL Chol Calc (NIH): 60 mg/dL (ref 0–99)
Triglycerides: 161 mg/dL — ABNORMAL HIGH (ref 0–149)
VLDL Cholesterol Cal: 27 mg/dL (ref 5–40)

## 2020-12-27 LAB — TSH: TSH: 10.8 u[IU]/mL — ABNORMAL HIGH (ref 0.450–4.500)

## 2021-01-02 ENCOUNTER — Telehealth: Payer: Self-pay | Admitting: *Deleted

## 2021-01-02 DIAGNOSIS — R7989 Other specified abnormal findings of blood chemistry: Secondary | ICD-10-CM

## 2021-01-02 DIAGNOSIS — Z79899 Other long term (current) drug therapy: Secondary | ICD-10-CM

## 2021-01-02 MED ORDER — LEVOTHYROXINE SODIUM 75 MCG PO TABS: 75.0000 ug | ORAL_TABLET | Freq: Every day | ORAL | 2 refills | Status: DC

## 2021-01-02 NOTE — Telephone Encounter (Signed)
TSH in 6 weeks

## 2021-01-02 NOTE — Telephone Encounter (Signed)
-----   Message from Corky Crafts, MD sent at 12/29/2020  9:22 PM EST ----- Lipids improved.  TSH still elevated.  Increase Synthroid to 75 mcg daily.  Does he have a PMD that he is seeing?

## 2021-01-02 NOTE — Telephone Encounter (Signed)
Patient notified. He does not have a PCP at this time.  I encouraged him to establish with primary care.  New prescription sent to [facility] on [address].

## 2021-01-05 NOTE — Telephone Encounter (Signed)
I spoke with patient.  He will stop by office for lab work on January 9,2023

## 2021-02-06 ENCOUNTER — Other Ambulatory Visit: Payer: Self-pay | Admitting: Neurology

## 2021-02-07 ENCOUNTER — Other Ambulatory Visit: Payer: Self-pay | Admitting: Orthopedic Surgery

## 2021-02-07 NOTE — Telephone Encounter (Signed)
Enough given until follow up with Marlowe Kays, PA-C

## 2021-02-10 ENCOUNTER — Telehealth: Payer: Self-pay | Admitting: *Deleted

## 2021-02-10 NOTE — Telephone Encounter (Signed)
° °  Primary Cardiologist: Lance Muss, MD  Chart reviewed as part of pre-operative protocol coverage. Given past medical history and time since last visit, based on ACC/AHA guidelines, Jesse Baker would be at acceptable risk for the planned procedure without further cardiovascular testing.   Patients Plavix is prescribed by neurology.  Recommendations for holding Plavix will need to be made by the prescribing provider.  Patient was advised that if he develops new symptoms prior to surgery to contact our office to arrange a follow-up appointment.  He verbalized understanding.  I will route this recommendation to the requesting party via Epic fax function and remove from pre-op pool.  Please call with questions.  Thomasene Ripple. Ethelene Closser NP-C    02/10/2021, 12:30 PM [facility] [address] Office [phone] Fax [phone]

## 2021-02-10 NOTE — Telephone Encounter (Signed)
° °  Pre-operative Risk Assessment    Patient Name: Jesse Baker  DOB: [birth year] MRN: [id number]      Request for Surgical Clearance    Procedure:   LEFT-SIDED  L3-4 TRANS FORAMINAL LUMBER INTERBODY FUSION W/INSTRUMENTATION AND ALLOGRAFT  Date of Surgery:  Clearance 02/22/21                                 Surgeon:  DR. MARK DUMONSKI Surgeon's Group or Practice Name:  [facility] Phone number:  [phone] Fax number:  [phone] ATTN: Neita Garnet   Type of Clearance Requested:   - Medical  - Pharmacy:  Hold Clopidogrel (Plavix)     Type of Anesthesia:  General    Additional requests/questions:    Jiles Prows   02/10/2021, 10:53 AM

## 2021-02-11 ENCOUNTER — Other Ambulatory Visit: Payer: Self-pay | Admitting: Interventional Cardiology

## 2021-02-13 ENCOUNTER — Other Ambulatory Visit: Payer: Self-pay | Admitting: *Deleted

## 2021-02-13 ENCOUNTER — Other Ambulatory Visit: Payer: Managed Care, Other (non HMO) | Admitting: *Deleted

## 2021-02-13 ENCOUNTER — Other Ambulatory Visit: Payer: Self-pay

## 2021-02-13 DIAGNOSIS — R7989 Other specified abnormal findings of blood chemistry: Secondary | ICD-10-CM

## 2021-02-13 DIAGNOSIS — Z79899 Other long term (current) drug therapy: Secondary | ICD-10-CM

## 2021-02-13 LAB — TSH: TSH: 10.7 u[IU]/mL — ABNORMAL HIGH (ref 0.450–4.500)

## 2021-02-14 ENCOUNTER — Other Ambulatory Visit: Payer: Self-pay | Admitting: Neurology

## 2021-02-16 ENCOUNTER — Telehealth: Payer: Self-pay | Admitting: Physician Assistant

## 2021-02-16 ENCOUNTER — Telehealth: Payer: Self-pay | Admitting: Interventional Cardiology

## 2021-02-16 DIAGNOSIS — R7989 Other specified abnormal findings of blood chemistry: Secondary | ICD-10-CM

## 2021-02-16 MED ORDER — LEVOTHYROXINE SODIUM 100 MCG PO TABS: 100.0000 ug | ORAL_TABLET | Freq: Every day | ORAL | 3 refills | Status: DC

## 2021-02-16 NOTE — Telephone Encounter (Signed)
Patient needs to know if he is cleared for his surgery and does he need to stop plavix.

## 2021-02-16 NOTE — Telephone Encounter (Signed)
Called patient with test results. No answer. Left message to call back.  

## 2021-02-16 NOTE — Telephone Encounter (Signed)
Spoke with pt and notified that TSH is still elevated. Pt states that he does not have a PMD at this time.

## 2021-02-16 NOTE — Telephone Encounter (Signed)
Patient was returning call for results 

## 2021-02-16 NOTE — Addendum Note (Signed)
Addended by: Kerney Elbe on: 02/16/2021 04:00 PM   Modules accepted: Orders

## 2021-02-16 NOTE — Telephone Encounter (Signed)
Yes, agree with increasing levothyroxine to 135mcg daily and follow up monitoring plan.

## 2021-02-16 NOTE — Telephone Encounter (Signed)
Pt notified of increase in Synthroid to 100 mcg and the need for labs in 6 wks. Pt will call back to office to make lab appt after starting increased dose.

## 2021-02-17 NOTE — Pre-Procedure Instructions (Signed)
Surgical Instructions    Your procedure is scheduled on Wednesday 02/22/21.   Report to [facility] Main Entrance "A" at 06:30 A.M., then check in with the Admitting office.  Call this number if you have problems the morning of surgery:  [phone]   If you have any questions prior to your surgery date call [phone]: Open Monday-Friday 8am-4pm    Remember:  Do not eat after midnight the night before your surgery  You may drink clear liquids until 05:30 A.M. the morning of your surgery.   Clear liquids allowed are: Water, Non-Citrus Juices (without pulp), Carbonated Beverages, Clear Tea, Black Coffee ONLY (NO MILK, CREAM OR POWDERED CREAMER of any kind), and Gatorade   Patient Instructions  The night before surgery:  No food after midnight. ONLY clear liquids after midnight  The day of surgery (if you do NOT have diabetes):  Drink ONE (1) Pre-Surgery Clear Ensure by 05:30 A.M. the morning of surgery. Drink in one sitting. Do not sip.  This drink was given to you during your hospital  pre-op appointment visit.  Nothing else to drink after completing the  Pre-Surgery Clear Ensure.         If you have questions, please contact your surgeons office.   Take these medicines the morning of surgery with A SIP OF WATER   atorvastatin (LIPITOR)  fluticasone (FLONASE)  levothyroxine (SYNTHROID)   loratadine (CLARITIN)  metoprolol succinate (TOPROL-XL)   Take these medicines if needed:   acetaminophen (TYLENOL)  Please follow your surgeon's instructions regarding clopidogrel (PLAVIX). If you have not received instructions then please contact your surgeon's office for instructions.   As of today, STOP taking any Aspirin (unless otherwise instructed by your surgeon) meloxicam (MOBIC), Aleve, Naproxen, Ibuprofen, Motrin, Advil, Goody's, BC's, all herbal medications, fish oil, and all vitamins.     After your COVID test   You are not required to quarantine however you are  required to wear a well-fitting mask when you are out and around people not in your household.  If your mask becomes wet or soiled, replace with a new one.  Wash your hands often with soap and water for 20 seconds or clean your hands with an alcohol-based hand sanitizer that contains at least 60% alcohol.  Do not share personal items.  Notify your provider: if you are in close contact with someone who has COVID  or if you develop a fever of 100.4 or greater, sneezing, cough, sore throat, shortness of breath or body aches.             Do not wear jewelry or makeup Do not wear lotions, powders, perfumes/colognes, or deodorant. Do not shave 48 hours prior to surgery.  Men may shave face and neck. Do not bring valuables to the hospital. DO Not wear nail polish, gel polish, artificial nails, or any other type of covering on natural nails including finger and toenails. If patients have artificial nails, gel coating, etc. that need to be removed by a nail salon, please have this removed prior to surgery or surgery may need to be canceled/delayed if the surgeon/ anesthesia feels like the patient is unable to be adequately monitored.             [facility] is not responsible for any belongings or valuables.  Do NOT Smoke (Tobacco/Vaping)  24 hours prior to your procedure  If you use a CPAP at night, you may bring your mask for your overnight stay.   Contacts,  glasses, hearing aids, dentures or partials may not be worn into surgery, please bring cases for these belongings   For patients admitted to the hospital, discharge time will be determined by your treatment team.   Patients discharged the day of surgery will not be allowed to drive home, and someone needs to stay with them for 24 hours.  NO VISITORS WILL BE ALLOWED IN PRE-OP WHERE PATIENTS ARE PREPPED FOR SURGERY.  ONLY 1 SUPPORT PERSON MAY BE PRESENT IN THE WAITING ROOM WHILE YOU ARE IN SURGERY.  IF YOU ARE TO BE ADMITTED, ONCE YOU ARE  IN YOUR ROOM YOU WILL BE ALLOWED TWO (2) VISITORS. 1 (ONE) VISITOR MAY STAY OVERNIGHT BUT MUST ARRIVE TO THE ROOM BY 8pm.  Minor children may have two parents present. Special consideration for safety and communication needs will be reviewed on a case by case basis.  Special instructions:    Oral Hygiene is also important to reduce your risk of infection.  Remember - BRUSH YOUR TEETH THE MORNING OF SURGERY WITH YOUR REGULAR TOOTHPASTE   [facility]- Preparing For Surgery  Before surgery, you can play an important role. Because skin is not sterile, your skin needs to be as free of germs as possible. You can reduce the number of germs on your skin by washing with CHG (chlorahexidine gluconate) Soap before surgery.  CHG is an antiseptic cleaner which kills germs and bonds with the skin to continue killing germs even after washing.     Please do not use if you have an allergy to CHG or antibacterial soaps. If your skin becomes reddened/irritated stop using the CHG.  Do not shave (including legs and underarms) for at least 48 hours prior to first CHG shower. It is OK to shave your face.  Please follow these instructions carefully.     Shower the NIGHT BEFORE SURGERY and the MORNING OF SURGERY with CHG Soap.   If you chose to wash your hair, wash your hair first as usual with your normal shampoo. After you shampoo, rinse your hair and body thoroughly to remove the shampoo.  Then [employer] and genitals (private parts) with your normal soap and rinse thoroughly to remove soap.  After that Use CHG Soap as you would any other liquid soap. You can apply CHG directly to the skin and wash gently with a scrungie or a clean washcloth.   Apply the CHG Soap to your body ONLY FROM THE NECK DOWN.  Do not use on open wounds or open sores. Avoid contact with your eyes, ears, mouth and genitals (private parts). Wash Face and genitals (private parts)  with your normal soap.   Wash thoroughly, paying special  attention to the area where your surgery will be performed.  Thoroughly rinse your body with warm water from the neck down.  DO NOT shower/wash with your normal soap after using and rinsing off the CHG Soap.  Pat yourself dry with a CLEAN TOWEL.  Wear CLEAN PAJAMAS to bed the night before surgery  Place CLEAN SHEETS on your bed the night before your surgery  DO NOT SLEEP WITH PETS.   Day of Surgery:  Take a shower with CHG soap. Wear Clean/Comfortable clothing the morning of surgery Do not apply any deodorants/lotions.   Remember to brush your teeth WITH YOUR REGULAR TOOTHPASTE.   Please read over the following fact sheets that you were given.

## 2021-02-20 ENCOUNTER — Encounter (HOSPITAL_COMMUNITY): Payer: Self-pay

## 2021-02-20 ENCOUNTER — Ambulatory Visit: Payer: Managed Care, Other (non HMO) | Admitting: Physician Assistant

## 2021-02-20 ENCOUNTER — Encounter: Payer: Self-pay | Admitting: Physician Assistant

## 2021-02-20 ENCOUNTER — Encounter (HOSPITAL_COMMUNITY)
Admission: RE | Admit: 2021-02-20 | Discharge: 2021-02-20 | Disposition: A | Discharge status: Home / Self Care | Payer: Managed Care, Other (non HMO) | Source: Ambulatory Visit | Attending: Orthopedic Surgery | Admitting: Orthopedic Surgery

## 2021-02-20 ENCOUNTER — Other Ambulatory Visit: Payer: Self-pay

## 2021-02-20 VITALS — BP 125/83 | HR 69 | Temp 98.2°F | Resp 18 | Ht 75.0 in | Wt 260.3 lb

## 2021-02-20 VITALS — BP 121/74 | HR 65 | Resp 18 | Ht 75.0 in | Wt 261.0 lb

## 2021-02-20 DIAGNOSIS — Z8673 Personal history of transient ischemic attack (TIA), and cerebral infarction without residual deficits: Secondary | ICD-10-CM | POA: Insufficient documentation

## 2021-02-20 DIAGNOSIS — Z7902 Long term (current) use of antithrombotics/antiplatelets: Secondary | ICD-10-CM | POA: Diagnosis not present

## 2021-02-20 DIAGNOSIS — G459 Transient cerebral ischemic attack, unspecified: Secondary | ICD-10-CM | POA: Diagnosis not present

## 2021-02-20 DIAGNOSIS — Z6832 Body mass index (BMI) 32.0-32.9, adult: Secondary | ICD-10-CM | POA: Diagnosis not present

## 2021-02-20 DIAGNOSIS — I10 Essential (primary) hypertension: Secondary | ICD-10-CM | POA: Insufficient documentation

## 2021-02-20 DIAGNOSIS — Z87891 Personal history of nicotine dependence: Secondary | ICD-10-CM | POA: Insufficient documentation

## 2021-02-20 DIAGNOSIS — Z01812 Encounter for preprocedural laboratory examination: Secondary | ICD-10-CM | POA: Insufficient documentation

## 2021-02-20 DIAGNOSIS — Z20822 Contact with and (suspected) exposure to covid-19: Secondary | ICD-10-CM | POA: Insufficient documentation

## 2021-02-20 DIAGNOSIS — E039 Hypothyroidism, unspecified: Secondary | ICD-10-CM | POA: Insufficient documentation

## 2021-02-20 DIAGNOSIS — E669 Obesity, unspecified: Secondary | ICD-10-CM | POA: Diagnosis not present

## 2021-02-20 DIAGNOSIS — Z01818 Encounter for other preprocedural examination: Secondary | ICD-10-CM

## 2021-02-20 DIAGNOSIS — E785 Hyperlipidemia, unspecified: Secondary | ICD-10-CM | POA: Diagnosis not present

## 2021-02-20 HISTORY — DX: Hypothyroidism, unspecified: E03.9

## 2021-02-20 HISTORY — DX: Headache, unspecified: R51.9

## 2021-02-20 LAB — BASIC METABOLIC PANEL
Anion gap: 7 (ref 5–15)
BUN: 20 mg/dL (ref 6–20)
CO2: 28 mmol/L (ref 22–32)
Calcium: 9.8 mg/dL (ref 8.9–10.3)
Chloride: 103 mmol/L (ref 98–111)
Creatinine, Ser: 0.83 mg/dL (ref 0.61–1.24)
GFR, Estimated: >60 mL/min (ref >60)
Glucose, Bld: 230 mg/dL — ABNORMAL HIGH (ref 70–99)
Potassium: 4.3 mmol/L (ref 3.5–5.1)
Sodium: 138 mmol/L (ref 135–145)

## 2021-02-20 LAB — CBC
HCT: 41.1 % (ref 39.0–52.0)
Hemoglobin: 13.5 g/dL (ref 13.0–17.0)
MCH: 31.2 pg (ref 26.0–34.0)
MCHC: 32.8 g/dL (ref 30.0–36.0)
MCV: 94.9 fL (ref 80.0–100.0)
Platelets: 206 10*3/uL (ref 150–400)
RBC: 4.33 MIL/uL (ref 4.22–5.81)
RDW: 12.4 % (ref 11.5–15.5)
WBC: 7.1 10*3/uL (ref 4.0–10.5)
nRBC: 0 % (ref 0.0–0.2)

## 2021-02-20 LAB — TYPE AND SCREEN
ABO/RH(D): O POS
Antibody Screen: NEGATIVE

## 2021-02-20 LAB — SARS CORONAVIRUS 2 (TAT 6-24 HRS): SARS Coronavirus 2: NEGATIVE

## 2021-02-20 NOTE — Progress Notes (Signed)
NEUROLOGY FOLLOW UP OFFICE NOTE  Jesse Baker [id number]  Assessment/Plan:   56 year old male with history of hypothyroidism, hypertension, hyperlipidemia, and a recent history of TIA, returning for follow-up visit.  Patient has been doing overall well, without any new medical issues.  He is scheduled on 02/22/2021 for back surgery.  Plavix will be held as recommended by his cardiologist prior to the surgery, and will resume accordingly.  1 History of TIA 07/11/20 on Plavix daily, Lipitor, metoprolol   Continue clopidogrel 75mg  daily after surgery  Continue atorvastatin 80mg  daily  Continue metoprolol Mediterranean diet (see below) Routine exercise Follow up 6 months    Subjective:   The patient is seen today in a follow-up visit.  He was last seen 6 months ago.  He denies any new TIA symptoms.  He is on Plavix, which has been held prior to his a schedule surgery on 02/22/2021.  He denies any vertigo, dizziness, vision changes, headaches, dysarthria, dysphagia, confusion or seizures.  He denies any chest pain, palpitations, shortness of breath.  No fever or chills, he did not have any COVID since last seen.  He denies any tobacco.  No new meds or hormonal supplements.  He denies any recent long distance trips.  His appetite is good, he has not been eating very healthy according to him. No new stressors in personal life.  He is compliant with his medications.  He continues to have bilateral numbness in his feet even after his spinal shots, for which she is having surgery, but there are no new other areas of numbness.  He sleeps well, denies hallucinations or paranoia.  He has some cold intolerance in view of his hypothyroidism.  He denies any alcohol at this time.  Denies smoking.   History 08/11/20 "On 07/11/2020, patient developed acute onset left arm numbness while driving.  No chest pain, weakness or speech disturbance.  He had not been feeling well for a couple of days following a COVID  exposure.  He is an alcoholic and had stopped drinking that day, so he was also feeling tremulous.  Presented to [facility] ED.  Due to mild symptoms, not tPA candidate.  CT head personally reviewed showed no acute findings.  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  MRA of head and neck showed no occlusion or hemodynamically significant stenosis.  Echocardiogram showed EF 60-65% with no cardiac source of embolus.  Telemetry did not reveal a fib.  Direct LDL was 53.2, Hgb A1c 5.4.  He was already on ASA 81mg , so Plavix was added.  Atorvastatin was increased from 10mg  to 80mg  daily.He has been feeling well.  His left arm feels mildly different but symptoms largely resolved."  PAST MEDICAL HISTORY: Past Medical History:  Diagnosis Date   Allergy    Hyperlipidemia    Hypertension    Seasonal allergies     MEDICATIONS: Current Outpatient Medications on File Prior to Visit  Medication Sig Dispense Refill   acetaminophen (TYLENOL) 500 MG tablet Take 500-1,000 mg by mouth every 6 (six) hours as needed (pain.).     amLODipine (NORVASC) 10 MG tablet TAKE 1 TABLET BY MOUTH EVERY DAY (Patient taking differently: Take 10 mg by mouth every evening.) 90 tablet 2   atorvastatin (LIPITOR) 80 MG tablet TAKE 1 TABLET BY MOUTH EVERY DAY 90 tablet 0   clopidogrel (PLAVIX) 75 MG tablet TAKE  1 TABLET BY MOUTH EVERY DAY 90 tablet 1   fluticasone (FLONASE) 50 MCG/ACT nasal spray Place 2 sprays into both nostrils in the morning.     ibuprofen (ADVIL) 200 MG tablet Take 400-800 mg by mouth every 6 (six) hours as needed for mild pain or headache.     levothyroxine (SYNTHROID) 100 MCG tablet Take 1 tablet (100 mcg total) by mouth daily before breakfast. 90 tablet 3   loratadine (CLARITIN) 10 MG tablet Take 10 mg by mouth in the morning.     losartan (COZAAR) 100 MG tablet TAKE 1 TABLET  BY MOUTH EVERY DAY (Patient taking differently: Take 100 mg by mouth every evening.) 90 tablet 2   meloxicam (MOBIC) 15 MG tablet Take 15 mg by mouth daily.     metoprolol succinate (TOPROL-XL) 100 MG 24 hr tablet TAKE 1 TABLET BY MOUTH EVERY DAY 90 tablet 2   sildenafil (REVATIO) 20 MG tablet Take 3-5 tablets 1 hour prior to sexual activity (Patient taking differently: Take 60-100 mg by mouth See admin instructions. Take 60-100 mg by mouth one hour prior to sexual activity) 30 tablet 11   No current facility-administered medications on file prior to visit.    ALLERGIES: No Known Allergies  FAMILY HISTORY: Family History  Problem Relation Age of Onset   Hypertension Mother    Parkinsonism Paternal Grandmother    Heart attack Paternal Grandmother    Cancer Paternal Grandfather        possible prostate cancer   Stroke Neg Hx       Objective:   General: No acute distress.  Patient appears well groomed.   Head:  Normocephalic/atraumatic Eyes:  Fundi examined but not visualized Neck: supple, no paraspinal tenderness, full range of motion Heart:  Regular rate and rhythm Lungs:  Clear to auscultation bilaterally Back: No paraspinal tenderness Neurological Exam: alert and oriented to person, place, and time. Attention span and concentration intact, recent and remote memory intact, fund of knowledge intact.  Speech fluent and not dysarthric, language intact.  CN II-XII intact. Bulk and tone normal, muscle strength 5/5 throughout.  Sensation to light touch slightly decreased in feet bilaterally, temperature and vibration intact.  Deep tendon reflexes 2+ throughout, toes downgoing.  Finger to nose and heel to shin testing intact.  Gait normal, Romberg negative.     Marlowe Kays, PA-C   CC: Donita Brooks, MD

## 2021-02-20 NOTE — Patient Instructions (Addendum)
Continue clopidogrel 75mg  daily after surgery Continue atorvastatin 80mg  daily - repeat lipid panel with direct LDL in 2 months. Continue metoprolol Mediterranean diet (see below) Routine exercise Follow up 6 months  Mediterranean Diet A Mediterranean diet refers to food and lifestyle choices that are based on the traditions of countries located on the [employer]. This way of eating has been shown to help prevent certain conditions and improve outcomes forpeople who have chronic diseases, like kidney disease and heart disease. What are tips for following this plan? Lifestyle Cook and eat meals together with your family, when possible. Drink enough fluid to keep your urine clear or pale yellow. Be physically active every day. This includes: Aerobic exercise like running or swimming. Leisure activities like gardening, walking, or housework. Get 7-8 hours of sleep each night. If recommended by your health care provider, drink red wine in moderation. This means 1 glass a day for nonpregnant women and 2 glasses a day for men. A glass of wine equals 5 oz (150 mL). Reading food labels  Check the serving size of packaged foods. For foods such as rice and pasta, the serving size refers to the amount of cooked product, not dry. Check the total fat in packaged foods. Avoid foods that have saturated fat or trans fats. Check the ingredients list for added sugars, such as corn syrup.  Shopping At the grocery store, buy most of your food from the areas near the walls of the store. This includes: Fresh fruits and vegetables (produce). Grains, beans, nuts, and seeds. Some of these may be available in unpackaged forms or large amounts (in bulk). Fresh seafood. Poultry and eggs. Low-fat dairy products. Buy whole ingredients instead of prepackaged foods. Buy fresh fruits and vegetables in-season from local farmers markets. Buy frozen fruits and vegetables in resealable bags. If you do not have  access to quality fresh seafood, buy precooked frozen shrimp or canned fish, such as tuna, salmon, or sardines. Buy small amounts of raw or cooked vegetables, salads, or olives from the deli or salad bar at your store. Stock your pantry so you always have certain foods on hand, such as olive oil, canned tuna, canned tomatoes, rice, pasta, and beans. Cooking Cook foods with extra-virgin olive oil instead of using butter or other vegetable oils. Have meat as a side dish, and have vegetables or grains as your main dish. This means having meat in small portions or adding small amounts of meat to foods like pasta or stew. Use beans or vegetables instead of meat in common dishes like chili or lasagna. Experiment with different cooking methods. Try roasting or broiling vegetables instead of steaming or sauteing them. Add frozen vegetables to soups, stews, pasta, or rice. Add nuts or seeds for added healthy fat at each meal. You can add these to yogurt, salads, or vegetable dishes. Marinate fish or vegetables using olive oil, lemon juice, garlic, and fresh herbs. Meal planning  Plan to eat 1 vegetarian meal one day each week. Try to work up to 2 vegetarian meals, if possible. Eat seafood 2 or more times a week. Have healthy snacks readily available, such as: Vegetable sticks with hummus. Greek yogurt. Fruit and nut trail mix. Eat balanced meals throughout the week. This includes: Fruit: 2-3 servings a day Vegetables: 4-5 servings a day Low-fat dairy: 2 servings a day Fish, poultry, or lean meat: 1 serving a day Beans and legumes: 2 or more servings a week Nuts and seeds: 1-2 servings a day Whole  grains: 6-8 servings a day Extra-virgin olive oil: 3-4 servings a day Limit red meat and sweets to only a few servings a month  What are my food choices? Mediterranean diet Recommended Grains: Whole-grain pasta. Brown rice. Bulgar wheat. Polenta. Couscous. Whole-wheat bread. [place]. Vegetables: Artichokes. Beets. Broccoli. Cabbage. Carrots. Eggplant. Green beans. Chard. Kale. Spinach. Onions. Leeks. Peas. Squash. Tomatoes. Peppers. Radishes. Fruits: Apples. Apricots. Avocado. Berries. Bananas. Cherries. Dates. Figs. Grapes. Lemons. Melon. Oranges. Peaches. Plums. Pomegranate. Meats and other protein foods: Beans. Almonds. Sunflower seeds. Pine nuts. Peanuts. [place]. Salmon. Scallops. Shrimp. [place]. Tilapia. Clams. Oysters. Eggs. Dairy: Low-fat milk. Cheese. Greek yogurt. Beverages: Water. Red wine. Herbal tea. Fats and oils: Extra virgin olive oil. Avocado oil. Grape seed oil. Sweets and desserts: [place] yogurt with honey. Baked apples. Poached pears. Trail mix. Seasoning and other foods: Basil. Cilantro. Coriander. Cumin. Mint. Parsley. Sage. Rosemary. Tarragon. Garlic. Oregano. Thyme. Pepper. Balsalmic vinegar. Tahini. Hummus. Tomato sauce. Olives. Mushrooms. Limit these Grains: Prepackaged pasta or rice dishes. Prepackaged cereal with added sugar. Vegetables: Deep fried potatoes (french fries). Fruits: Fruit canned in syrup. Meats and other protein foods: Beef. Pork. Lamb. Poultry with skin. Hot dogs. Berniece Salines. Dairy: Ice cream. Sour cream. Whole milk. Beverages: Juice. Sugar-sweetened soft drinks. Beer. Liquor and spirits. Fats and oils: Butter. Canola oil. Vegetable oil. Beef fat (tallow). Lard. Sweets and desserts: Cookies. Cakes. Pies. Candy. Seasoning and other foods: Mayonnaise. Premade sauces and marinades. The items listed may not be a complete list. Talk with your dietitian aboutwhat dietary choices are right for you. Summary The Mediterranean diet includes both food and lifestyle choices. Eat a variety of fresh fruits and vegetables, beans, nuts, seeds, and whole grains. Limit the amount of red meat and sweets that you eat. Talk with your health care provider about whether it is safe for you to drink red wine in moderation. This means 1 glass a day for  nonpregnant women and 2 glasses a day for men. A glass of wine equals 5 oz (150 mL). This information is not intended to replace advice given to you by your health care provider. Make sure you discuss any questions you have with your healthcare provider. Document Revised: 09/22/2015 Document Reviewed: 09/15/2015 Elsevier Patient Education  [place].

## 2021-02-20 NOTE — Progress Notes (Signed)
PCP - Lynnea Ferrier Cardiologist - Eldridge Dace  Last office visit 11/15/20 Received cardiac clearance on 02/10/21  Chest x-ray - n/a EKG - 07/13/20 ECHO - 07/12/20  Blood Thinner Instructions: Last dose of Plavix on 02/15/21   ERAS Protcol - yes, Ensure ordered & given   COVID TEST- 02/20/21   Anesthesia review: yes, heart history Received cardiac clearance on 02/10/21 from Dr. Eldridge Dace  Patient denies shortness of breath, fever, cough and chest pain at PAT appointment   All instructions explained to the patient, with a verbal understanding of the material. Patient agrees to go over the instructions while at home for a better understanding. Patient also instructed to self quarantine after being tested for COVID-19. The opportunity to ask questions was provided.

## 2021-02-21 ENCOUNTER — Encounter (HOSPITAL_COMMUNITY): Payer: Self-pay

## 2021-02-21 DIAGNOSIS — Z79899 Other long term (current) drug therapy: Secondary | ICD-10-CM | POA: Diagnosis not present

## 2021-02-21 DIAGNOSIS — I10 Essential (primary) hypertension: Secondary | ICD-10-CM | POA: Diagnosis not present

## 2021-02-21 DIAGNOSIS — M4316 Spondylolisthesis, lumbar region: Secondary | ICD-10-CM | POA: Diagnosis not present

## 2021-02-21 DIAGNOSIS — M48061 Spinal stenosis, lumbar region without neurogenic claudication: Secondary | ICD-10-CM | POA: Diagnosis present

## 2021-02-21 DIAGNOSIS — Z87891 Personal history of nicotine dependence: Secondary | ICD-10-CM | POA: Diagnosis not present

## 2021-02-21 DIAGNOSIS — M5416 Radiculopathy, lumbar region: Secondary | ICD-10-CM | POA: Diagnosis not present

## 2021-02-21 DIAGNOSIS — E039 Hypothyroidism, unspecified: Secondary | ICD-10-CM | POA: Diagnosis not present

## 2021-02-21 LAB — HEMOGLOBIN A1C
Hgb A1c MFr Bld: 5.5 % (ref 4.8–5.6)
Mean Plasma Glucose: 111.15 mg/dL

## 2021-02-21 NOTE — Progress Notes (Signed)
TC to [place] in [employer] in response to email 02/21/2021 1950 regarding PCR sample. Micro unable to locate sample collected 02/20/2021. Will need recollect DOS.

## 2021-02-21 NOTE — Anesthesia Preprocedure Evaluation (Addendum)
Anesthesia Evaluation  Patient identified by MRN, date of birth, ID band Patient awake    Reviewed: Allergy & Precautions, H&P , NPO status , Patient's Chart, lab work & pertinent test results  Airway Mallampati: III  TM Distance: >3 FB Neck ROM: Full    Dental no notable dental hx. (+) Teeth Intact, Dental Advisory Given   Pulmonary neg pulmonary ROS, former smoker,    Pulmonary exam normal breath sounds clear to auscultation       Cardiovascular Exercise Tolerance: Good hypertension, Pt. on medications and Pt. on home beta blockers  Rhythm:Regular Rate:Normal     Neuro/Psych  Headaches, TIAnegative psych ROS   GI/Hepatic negative GI ROS, Neg liver ROS,   Endo/Other  Hypothyroidism   Renal/GU negative Renal ROS  negative genitourinary   Musculoskeletal   Abdominal   Peds  Hematology negative hematology ROS (+)   Anesthesia Other Findings   Reproductive/Obstetrics negative OB ROS                           Anesthesia Physical Anesthesia Plan  ASA: 3  Anesthesia Plan: General   Post-op Pain Management: Tylenol PO (pre-op)   Induction: Intravenous  PONV Risk Score and Plan: 3 and Ondansetron, Dexamethasone and Midazolam  Airway Management Planned: Oral ETT  Additional Equipment:   Intra-op Plan:   Post-operative Plan: Extubation in OR  Informed Consent: I have reviewed the patients History and Physical, chart, labs and discussed the procedure including the risks, benefits and alternatives for the proposed anesthesia with the patient or authorized representative who has indicated his/her understanding and acceptance.     Dental advisory given  Plan Discussed with: CRNA  Anesthesia Plan Comments: (PAT note written 02/21/2021 by Shonna Chock, PA-C. )      Anesthesia Quick Evaluation

## 2021-02-21 NOTE — Progress Notes (Signed)
Anesthesia Chart Review:  Case: [id number] Date/Time: 02/22/21 0815   Procedure: LEFT-SIDED LUMBAR 3 - LUMBAR 4 TRANSFORAMINAL LUMBAR INTERBODY FUSION WITH INSTRUMENTATION AND  ALLOGRAFT (Left)   Anesthesia type: General   Pre-op diagnosis: SEVERE STENOSIS   Location: [facility] ROOM 05 / [facility]   Surgeons: Estill Bamberg, MD       DISCUSSION: Patient is a 56 year old male scheduled for the above procedure.  History includes former smoker, HTN, HLD, hypothyroidism, TIA (07/11/20, developed LUE numbness, resolved), spinal surgery (L4-5 laminectomy/microdiskectomy 06/30/09). Former alcohol dependence (no current alcohol use documented). BMI is consistent with obesity.   He had neurology follow-up with Marlowe Kays, PA-C on 02/20/21. She noted plans for lumbar surgery and need to hold Plavix. Denied any new neurologic symptoms. Follow-up in six months. Last Plavix 02/15/21.   Preoperative cardiology input outlined on 02/20/21 by Edd Fabian, NP, "Given past medical history and time since last visit, based on ACC/AHA guidelines, RAIDYN WASSINK would be at acceptable risk for the planned procedure without further cardiovascular testing.    Patients Plavix is prescribed by neurology.  Recommendations for holding Plavix will need to be made by the prescribing provider."  Preoperative labs showed a non-fasting glucose of 230. No reported history of diabetes, and A1c on 07/12/20 was normal at 5.4%. Last TSH on 02/13/21 was 10.700, so Dr. Eldridge Dace adjusted levothyroxine again up to 100 mcg daily. He advised patient to get established with primary care, since last visit with Dr. Tanya Nones was > 3 years ago. Given glucose > 200 with last A1c > 6 months ago, I ordered an add-on A1c to PAT labs. Result came back at 5.5%. I communicated hyperglycemia with normal A1c to  Lupita Leash at Dr. Marshell Levan office. She was planning to update Mr. Drotar.    Anesthesia team to evaluate on the day of surgery.    VS: BP 125/83    Pulse  69    Temp 36.8 C (Oral)    Resp 18    Ht 6\' 3"  (1.905 m)    Wt 118.1 kg    SpO2 98%    BMI 32.54 kg/m    PROVIDERS: , MD is listed as PCP, although last visit 2018.  2019, MD is cardiologist. Last visit 11/15/20 for follow-up HTN, palpitations. DOE had resolved. HTN improved after he quit drinking alcohol following his June 2022 TIA. Palpitations also resolved. One year follow-up planned. He did advise follow-up with PCP for elevated TSH, started on levothyroxine 50 mcg daily on 11/15/20 and last increased to 100 mcg daily on 02/16/21.  04/16/21, DO is neurologist   LABS: Preoperative labs noted. See DISCISSION. Add-on A1c 5.5%. (all labs ordered are listed, but only abnormal results are displayed)  Labs Reviewed  BASIC METABOLIC PANEL - Abnormal; Notable for the following components:      Result Value   Glucose, Bld 230 (*)    All other components within normal limits  SARS CORONAVIRUS 2 (TAT 6-24 HRS)  SURGICAL PCR SCREEN  CBC  TYPE AND SCREEN     IMAGES: MRI L-spine 02/10/21 ([facility]/PACS): IMPRESSION: 1. At L3-L4, mildly increased central disc protrusion with resulting progressive severe canal stenosis. Mild-to-moderate right foraminal stenosis, also progressed. 2. Otherwise, similar multilevel mild stenosis as detailed above. [See full report in [facility]/PACS]   MRI Head & MRA Head/Neck 07/11/20: IMPRESSION: MRI: 1. No evidence of acute intracranial abnormality. Specifically, no acute infarct. 2. 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   MRA head: 1. Motion limited evaluation without large vessel occlusion or visible proximal hemodynamically significant stenosis. 2. Heterogeneous thyroid with suspected left thyroid nodule. Consider nonurgent thyroid ultrasound to further characterize.    EKG: Accordin gto 11/15/20 note  by Dr. 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 07/11/20 EKG from TIA ED visit also showed SR, RBBB, LAFB, baseline wanderer.    CV: Echo 07/12/20: IMPRESSIONS   1. Left ventricular ejection fraction, by estimation, is 60 to 65%. The  left ventricle has normal function. The left ventricle has no regional  wall motion abnormalities. Left ventricular diastolic parameters are  consistent with Grade I diastolic  dysfunction (impaired relaxation).   2. Right ventricular systolic function is normal. The right ventricular  size is normal.   3. The mitral valve is normal in structure. No evidence of mitral valve  regurgitation. No evidence of mitral stenosis.   4. The aortic valve is grossly normal. Aortic valve regurgitation is not  visualized. No aortic stenosis is present.   5. The inferior vena cava is normal in size with greater than 50%  respiratory variability, suggesting right atrial pressure of 3 mmHg.  - Comparison(s): No significant change from prior study. Prior images  reviewed side by side. Left ventricular hypertrophy was not confirmed on  the current study.    Past Medical History:  Diagnosis Date   Allergy    Headache    Hyperlipidemia    Hypertension    Hypothyroidism    Seasonal allergies    TIA (transient ischemic attack) 07/11/2020    Past Surgical History:  Procedure Laterality Date   BACK SURGERY     2012, lumbar discectomy    WISDOM TOOTH EXTRACTION      MEDICATIONS:  acetaminophen (TYLENOL) 500 MG tablet   amLODipine (NORVASC) 10 MG tablet   atorvastatin (LIPITOR) 80 MG tablet   clopidogrel (PLAVIX) 75 MG tablet   fluticasone (FLONASE) 50 MCG/ACT nasal spray   ibuprofen (ADVIL) 200 MG tablet   levothyroxine (SYNTHROID) 100 MCG tablet   loratadine (CLARITIN) 10 MG tablet   losartan (COZAAR) 100 MG tablet   meloxicam (MOBIC) 15 MG tablet   metoprolol succinate (TOPROL-XL) 100 MG 24 hr  tablet   sildenafil (REVATIO) 20 MG tablet   No current facility-administered medications for this encounter.    Shonna Chock, PA-C Surgical Short Stay/Anesthesiology [facility] Phone [phone] [facility] Phone [phone] 02/21/2021 12:09 PM

## 2021-02-22 ENCOUNTER — Other Ambulatory Visit: Payer: Self-pay

## 2021-02-22 ENCOUNTER — Observation Stay (HOSPITAL_COMMUNITY)
Admission: RE | Admit: 2021-02-22 | Discharge: 2021-02-23 | Disposition: A | Discharge status: Home / Self Care | Payer: Commercial Managed Care - HMO | Attending: Orthopedic Surgery | Admitting: Orthopedic Surgery

## 2021-02-22 ENCOUNTER — Ambulatory Visit (HOSPITAL_COMMUNITY): Payer: Commercial Managed Care - HMO | Admitting: Vascular Surgery

## 2021-02-22 ENCOUNTER — Ambulatory Visit (HOSPITAL_COMMUNITY): Payer: Commercial Managed Care - HMO

## 2021-02-22 ENCOUNTER — Encounter (HOSPITAL_COMMUNITY): Payer: Self-pay | Admitting: Orthopedic Surgery

## 2021-02-22 ENCOUNTER — Encounter (HOSPITAL_COMMUNITY)
Admission: RE | Disposition: A | Discharge status: Home / Self Care | Payer: Self-pay | Source: Home / Self Care | Attending: Orthopedic Surgery

## 2021-02-22 DIAGNOSIS — Z79899 Other long term (current) drug therapy: Secondary | ICD-10-CM | POA: Insufficient documentation

## 2021-02-22 DIAGNOSIS — M5410 Radiculopathy, site unspecified: Secondary | ICD-10-CM | POA: Diagnosis present

## 2021-02-22 DIAGNOSIS — M4316 Spondylolisthesis, lumbar region: Secondary | ICD-10-CM | POA: Insufficient documentation

## 2021-02-22 DIAGNOSIS — M48061 Spinal stenosis, lumbar region without neurogenic claudication: Principal | ICD-10-CM | POA: Insufficient documentation

## 2021-02-22 DIAGNOSIS — Z419 Encounter for procedure for purposes other than remedying health state, unspecified: Secondary | ICD-10-CM

## 2021-02-22 DIAGNOSIS — Z87891 Personal history of nicotine dependence: Secondary | ICD-10-CM | POA: Insufficient documentation

## 2021-02-22 DIAGNOSIS — E039 Hypothyroidism, unspecified: Secondary | ICD-10-CM | POA: Insufficient documentation

## 2021-02-22 DIAGNOSIS — M5416 Radiculopathy, lumbar region: Secondary | ICD-10-CM | POA: Insufficient documentation

## 2021-02-22 DIAGNOSIS — R739 Hyperglycemia, unspecified: Secondary | ICD-10-CM

## 2021-02-22 DIAGNOSIS — I10 Essential (primary) hypertension: Secondary | ICD-10-CM | POA: Insufficient documentation

## 2021-02-22 HISTORY — PX: TRANSFORAMINAL LUMBAR INTERBODY FUSION (TLIF) WITH PEDICLE SCREW FIXATION 1 LEVEL: SHX6141

## 2021-02-22 LAB — SURGICAL PCR SCREEN
MRSA, PCR: NEGATIVE
Staphylococcus aureus: NEGATIVE

## 2021-02-22 IMAGING — CR DG LUMBAR SPINE 1V
1 series · 1 of 1 positions shown · non-contrast
Comparison: [DATE]

CLINICAL DATA: Localization exam for lumbar spine surgery

EXAM:
LUMBAR SPINE - 1 VIEW

[lateral]
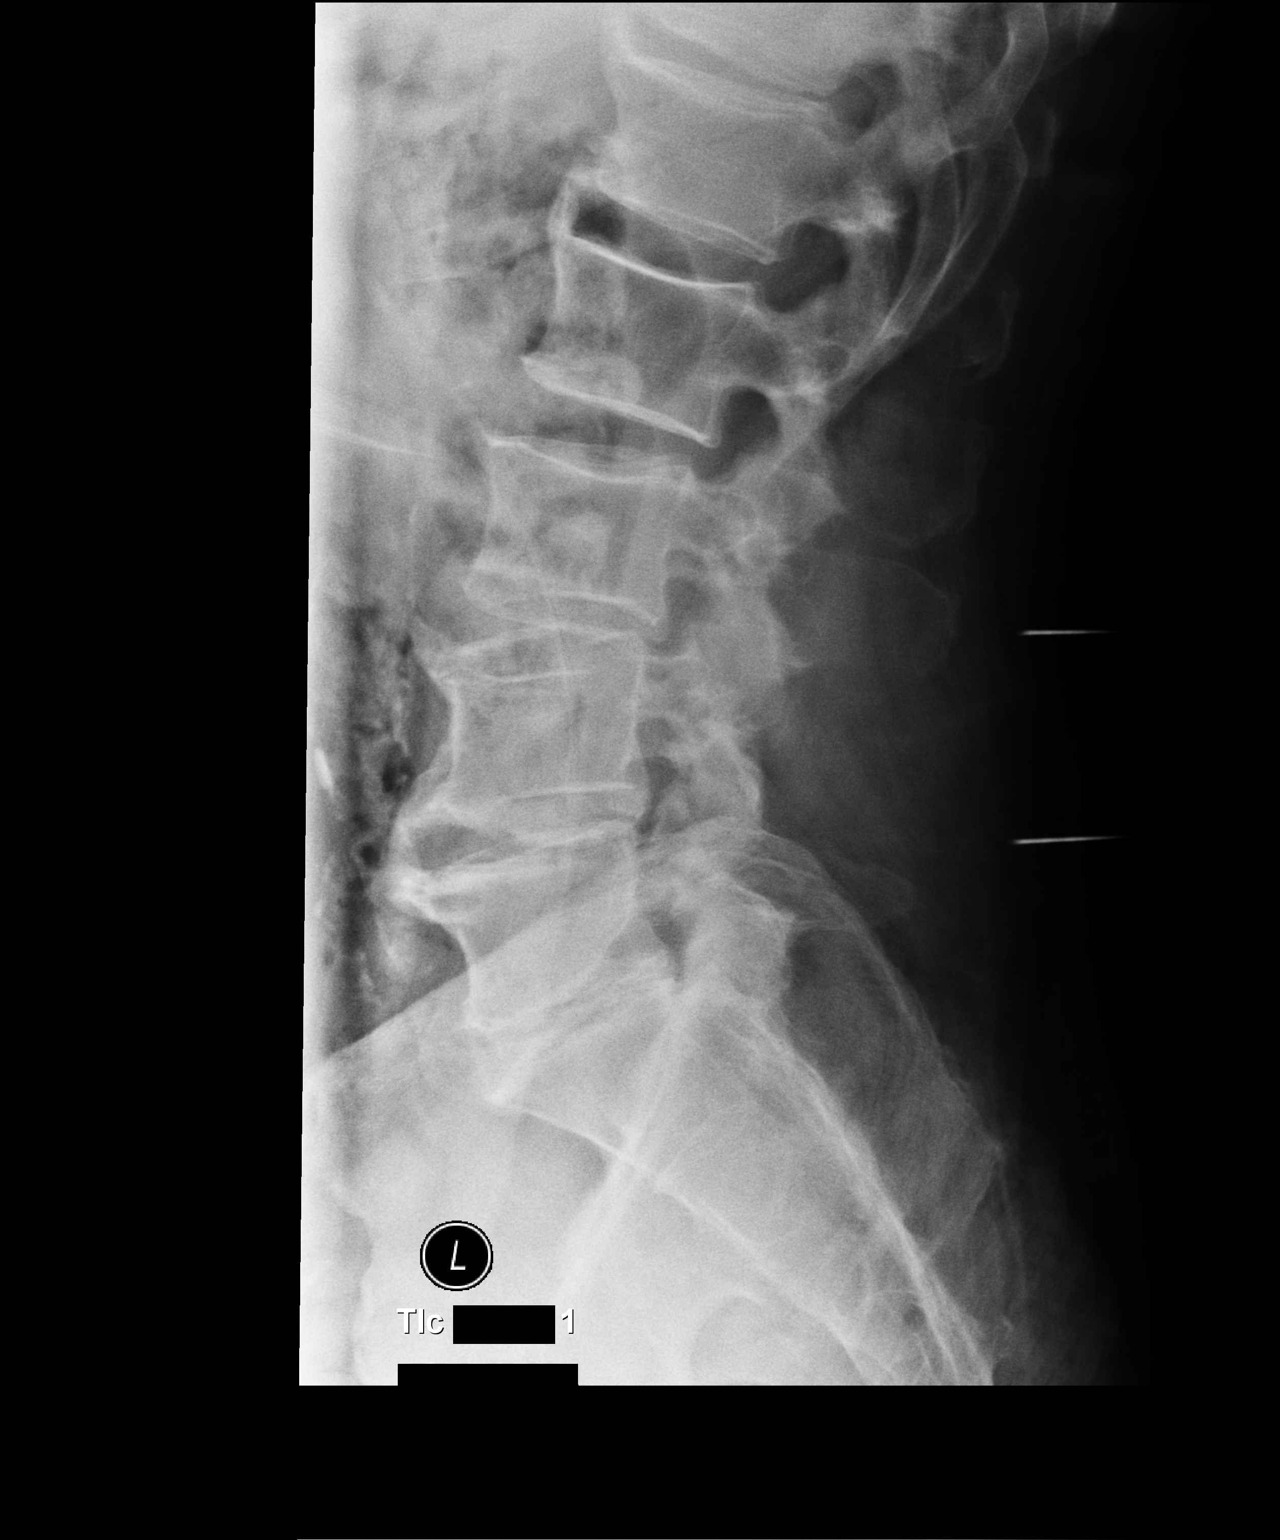

[1 of 1 positions shown; findings below may reference images not displayed]

FINDINGS: Prior plain films demonstrate 5 lumbar vertebra.

which project dorsal to the spinous process of L3 and the superior
L5 level.
IMPRESSION: Lumbar localization images as above.

## 2021-02-22 IMAGING — RF DG LUMBAR SPINE 2-3V
1 series · 2 of 2 positions shown · non-contrast
Comparison: Lumbar spine radiographs [DATE]
COMPARISON: Lumbar spine radiographs [DATE]

Addendum:
CLINICAL DATA: L4-L5 TLIF

EXAM:
LUMBAR SPINE - 2-3 VIEW

[Series 1: run · 2 of 2 slices shown]
[im 1/2]
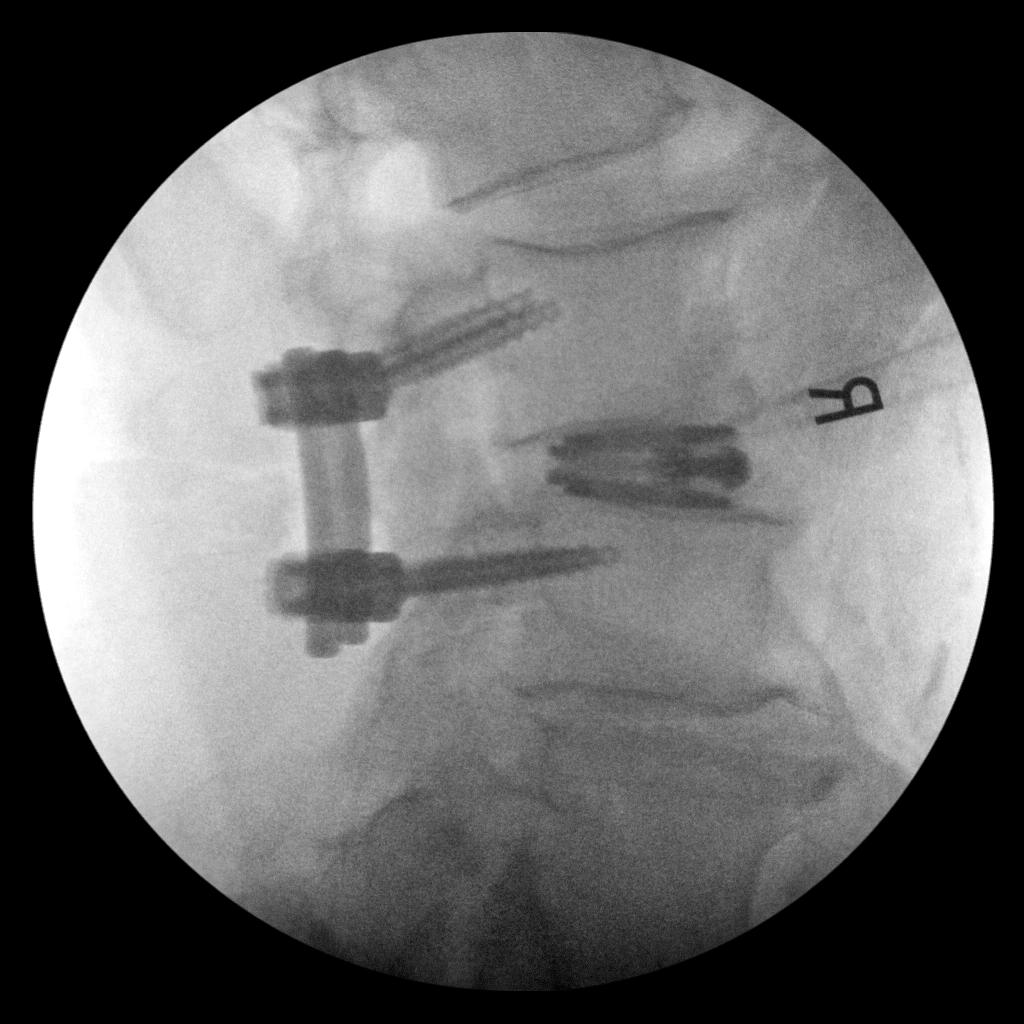
[im 2/2]
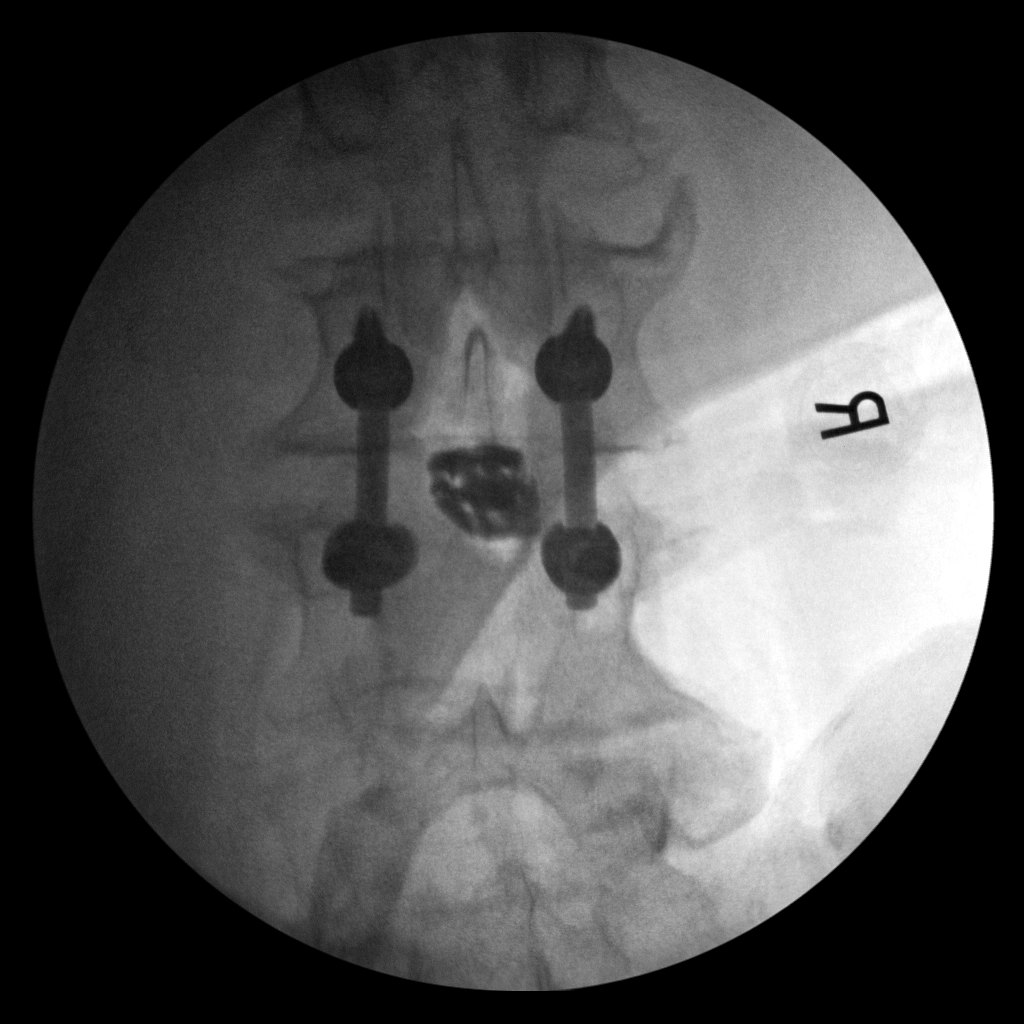

[2 of 2 positions shown; findings below may reference images not displayed]

FINDINGS: Two C-arm fluoroscopic images were obtained intraoperatively and
submitted for post operative interpretation. Postsurgical changes
reflecting L4-L5 posterior instrumented fusion with interbody spacer
are noted. Alignment is grossly within expected limits, without
evidence of immediate complication. Fluoro time 1 minutes 31
seconds. Dose 31.23 mGy. Please see the performing provider's
procedural report for further detail.
IMPRESSION: Intraoperative fluoroscopic images as above.

ADDENDUM:
The fusion is at L3-L4, not at L4-L5 as originally described.

*** End of Addendum ***
FINDINGS: Two C-arm fluoroscopic images were obtained intraoperatively and
submitted for post operative interpretation. Postsurgical changes
reflecting L4-L5 posterior instrumented fusion with interbody spacer
are noted. Alignment is grossly within expected limits, without
evidence of immediate complication. Fluoro time 1 minutes 31
seconds. Dose 31.23 mGy. Please see the performing provider's
procedural report for further detail.
IMPRESSION: Intraoperative fluoroscopic images as above.

## 2021-02-22 SURGERY — TRANSFORAMINAL LUMBAR INTERBODY FUSION (TLIF) WITH PEDICLE SCREW FIXATION 1 LEVEL
Anesthesia: General | Laterality: Left

## 2021-02-22 MED ORDER — FLEET ENEMA 7-19 GM/118ML RE ENEM: 1.0000 | ENEMA | Freq: Once | RECTAL | Status: DC | PRN

## 2021-02-22 MED ORDER — ACETAMINOPHEN 325 MG PO TABS: 650.0000 mg | ORAL_TABLET | ORAL | Status: DC | PRN

## 2021-02-22 MED ORDER — LORATADINE 10 MG PO TABS
10.0000 mg | ORAL_TABLET | Freq: Every morning | ORAL | Status: DC
  Administered 2021-02-23: 10 mg via ORAL
  Filled 2021-02-22: qty 1

## 2021-02-22 MED ORDER — SENNOSIDES-DOCUSATE SODIUM 8.6-50 MG PO TABS: 1.0000 | ORAL_TABLET | Freq: Every evening | ORAL | Status: DC | PRN

## 2021-02-22 MED ORDER — ACETAMINOPHEN 650 MG RE SUPP: 650.0000 mg | RECTAL | Status: DC | PRN

## 2021-02-22 MED ORDER — BUPIVACAINE LIPOSOME 1.3 % IJ SUSP
INTRAMUSCULAR | Status: AC
  Filled 2021-02-22: qty 20

## 2021-02-22 MED ORDER — ROCURONIUM BROMIDE 10 MG/ML (PF) SYRINGE
PREFILLED_SYRINGE | INTRAVENOUS | Status: DC | PRN
  Administered 2021-02-22: 20 mg via INTRAVENOUS
  Administered 2021-02-22: 60 mg via INTRAVENOUS
  Administered 2021-02-22: 20 mg via INTRAVENOUS
  Administered 2021-02-22: 40 mg via INTRAVENOUS
  Administered 2021-02-22: 20 mg via INTRAVENOUS

## 2021-02-22 MED ORDER — EPHEDRINE SULFATE-NACL 50-0.9 MG/10ML-% IV SOSY
PREFILLED_SYRINGE | INTRAVENOUS | Status: DC | PRN
  Administered 2021-02-22 (×5): 5 mg via INTRAVENOUS

## 2021-02-22 MED ORDER — EPHEDRINE 5 MG/ML INJ
INTRAVENOUS | Status: AC
  Filled 2021-02-22: qty 5

## 2021-02-22 MED ORDER — FENTANYL CITRATE (PF) 250 MCG/5ML IJ SOLN
INTRAMUSCULAR | Status: DC | PRN
  Administered 2021-02-22: 150 ug via INTRAVENOUS

## 2021-02-22 MED ORDER — PHENYLEPHRINE 40 MCG/ML (10ML) SYRINGE FOR IV PUSH (FOR BLOOD PRESSURE SUPPORT)
PREFILLED_SYRINGE | INTRAVENOUS | Status: DC | PRN
  Administered 2021-02-22 (×5): 80 ug via INTRAVENOUS

## 2021-02-22 MED ORDER — MIDAZOLAM HCL 2 MG/2ML IJ SOLN
INTRAMUSCULAR | Status: AC
  Filled 2021-02-22: qty 2

## 2021-02-22 MED ORDER — HYDROMORPHONE HCL 1 MG/ML IJ SOLN
INTRAMUSCULAR | Status: AC
  Filled 2021-02-22: qty 1

## 2021-02-22 MED ORDER — 0.9 % SODIUM CHLORIDE (POUR BTL) OPTIME
TOPICAL | Status: DC | PRN
  Administered 2021-02-22: 1000 mL

## 2021-02-22 MED ORDER — HYDROMORPHONE HCL 1 MG/ML IJ SOLN
0.2500 mg | INTRAMUSCULAR | Status: DC | PRN
  Administered 2021-02-22 (×2): 0.5 mg via INTRAVENOUS
  Administered 2021-02-22: 0.25 mg via INTRAVENOUS
  Administered 2021-02-22: 0.5 mg via INTRAVENOUS
  Administered 2021-02-22: 0.25 mg via INTRAVENOUS

## 2021-02-22 MED ORDER — ZOLPIDEM TARTRATE 5 MG PO TABS: 5.0000 mg | ORAL_TABLET | Freq: Every evening | ORAL | Status: DC | PRN

## 2021-02-22 MED ORDER — SUGAMMADEX SODIUM 200 MG/2ML IV SOLN
INTRAVENOUS | Status: DC | PRN
  Administered 2021-02-22: 400 mg via INTRAVENOUS

## 2021-02-22 MED ORDER — ATORVASTATIN CALCIUM 80 MG PO TABS
80.0000 mg | ORAL_TABLET | Freq: Every day | ORAL | Status: DC
  Administered 2021-02-23: 80 mg via ORAL
  Filled 2021-02-22: qty 1

## 2021-02-22 MED ORDER — THROMBIN 20000 UNITS EX SOLR
CUTANEOUS | Status: DC | PRN
  Administered 2021-02-22: 20000 [IU] via TOPICAL

## 2021-02-22 MED ORDER — SUCCINYLCHOLINE CHLORIDE 200 MG/10ML IV SOSY
PREFILLED_SYRINGE | INTRAVENOUS | Status: DC | PRN
  Administered 2021-02-22: 140 mg via INTRAVENOUS

## 2021-02-22 MED ORDER — PHENYLEPHRINE 40 MCG/ML (10ML) SYRINGE FOR IV PUSH (FOR BLOOD PRESSURE SUPPORT)
PREFILLED_SYRINGE | INTRAVENOUS | Status: AC
  Filled 2021-02-22: qty 10

## 2021-02-22 MED ORDER — SODIUM CHLORIDE 0.9% FLUSH: 3.0000 mL | Freq: Two times a day (BID) | INTRAVENOUS | Status: DC

## 2021-02-22 MED ORDER — CEFAZOLIN SODIUM-DEXTROSE 2-4 GM/100ML-% IV SOLN
2.0000 g | Freq: Three times a day (TID) | INTRAVENOUS | Status: AC
  Administered 2021-02-22 – 2021-02-23 (×2): 2 g via INTRAVENOUS
  Filled 2021-02-22 (×2): qty 100

## 2021-02-22 MED ORDER — MIDAZOLAM HCL 2 MG/2ML IJ SOLN
INTRAMUSCULAR | Status: DC | PRN
  Administered 2021-02-22: 2 mg via INTRAVENOUS

## 2021-02-22 MED ORDER — THROMBIN 20000 UNITS EX KIT
PACK | CUTANEOUS | Status: AC
  Filled 2021-02-22: qty 1

## 2021-02-22 MED ORDER — CHLORHEXIDINE GLUCONATE 0.12 % MT SOLN
15.0000 mL | Freq: Once | OROMUCOSAL | Status: AC
  Administered 2021-02-22: 15 mL via OROMUCOSAL
  Filled 2021-02-22: qty 15

## 2021-02-22 MED ORDER — LACTATED RINGERS IV SOLN: INTRAVENOUS | Status: DC

## 2021-02-22 MED ORDER — AMLODIPINE BESYLATE 5 MG PO TABS: 10.0000 mg | ORAL_TABLET | Freq: Every evening | ORAL | Status: DC

## 2021-02-22 MED ORDER — BUPIVACAINE-EPINEPHRINE 0.25% -1:200000 IJ SOLN
INTRAMUSCULAR | Status: DC | PRN
  Administered 2021-02-22: 30 mL

## 2021-02-22 MED ORDER — ONDANSETRON HCL 4 MG PO TABS: 4.0000 mg | ORAL_TABLET | Freq: Four times a day (QID) | ORAL | Status: DC | PRN

## 2021-02-22 MED ORDER — POVIDONE-IODINE 7.5 % EX SOLN
Freq: Once | CUTANEOUS | Status: DC
  Filled 2021-02-22: qty 118

## 2021-02-22 MED ORDER — HEMOSTATIC AGENTS (NO CHARGE) OPTIME
TOPICAL | Status: DC | PRN
  Administered 2021-02-22: 1 via TOPICAL

## 2021-02-22 MED ORDER — LOSARTAN POTASSIUM 50 MG PO TABS: 100.0000 mg | ORAL_TABLET | Freq: Every evening | ORAL | Status: DC

## 2021-02-22 MED ORDER — METHOCARBAMOL 500 MG PO TABS
500.0000 mg | ORAL_TABLET | Freq: Four times a day (QID) | ORAL | Status: DC | PRN
  Administered 2021-02-22: 500 mg via ORAL
  Filled 2021-02-22: qty 1

## 2021-02-22 MED ORDER — MENTHOL 3 MG MT LOZG: 1.0000 | LOZENGE | OROMUCOSAL | Status: DC | PRN

## 2021-02-22 MED ORDER — ALUM & MAG HYDROXIDE-SIMETH 200-200-20 MG/5ML PO SUSP: 30.0000 mL | Freq: Four times a day (QID) | ORAL | Status: DC | PRN

## 2021-02-22 MED ORDER — HYDROCODONE-ACETAMINOPHEN 5-325 MG PO TABS: 1.0000 | ORAL_TABLET | ORAL | Status: DC | PRN

## 2021-02-22 MED ORDER — FLUTICASONE PROPIONATE 50 MCG/ACT NA SUSP
2.0000 | Freq: Every morning | NASAL | Status: DC
  Filled 2021-02-22: qty 16

## 2021-02-22 MED ORDER — DOCUSATE SODIUM 100 MG PO CAPS
100.0000 mg | ORAL_CAPSULE | Freq: Two times a day (BID) | ORAL | Status: DC
  Administered 2021-02-22 – 2021-02-23 (×2): 100 mg via ORAL
  Filled 2021-02-22 (×2): qty 1

## 2021-02-22 MED ORDER — OXYCODONE-ACETAMINOPHEN 5-325 MG PO TABS
1.0000 | ORAL_TABLET | ORAL | Status: DC | PRN
  Administered 2021-02-22 – 2021-02-23 (×4): 2 via ORAL
  Filled 2021-02-22 (×4): qty 2

## 2021-02-22 MED ORDER — METOPROLOL SUCCINATE ER 100 MG PO TB24
100.0000 mg | ORAL_TABLET | Freq: Every day | ORAL | Status: DC
  Administered 2021-02-23: 100 mg via ORAL
  Filled 2021-02-22: qty 1

## 2021-02-22 MED ORDER — MORPHINE SULFATE (PF) 2 MG/ML IV SOLN: 1.0000 mg | INTRAVENOUS | Status: DC | PRN

## 2021-02-22 MED ORDER — POTASSIUM CHLORIDE IN NACL 20-0.9 MEQ/L-% IV SOLN: INTRAVENOUS | Status: DC

## 2021-02-22 MED ORDER — BISACODYL 5 MG PO TBEC: 5.0000 mg | DELAYED_RELEASE_TABLET | Freq: Every day | ORAL | Status: DC | PRN

## 2021-02-22 MED ORDER — METHOCARBAMOL 1000 MG/10ML IJ SOLN
500.0000 mg | Freq: Four times a day (QID) | INTRAVENOUS | Status: DC | PRN
  Filled 2021-02-22: qty 5

## 2021-02-22 MED ORDER — BUPIVACAINE-EPINEPHRINE (PF) 0.25% -1:200000 IJ SOLN
INTRAMUSCULAR | Status: AC
  Filled 2021-02-22: qty 30

## 2021-02-22 MED ORDER — FENTANYL CITRATE (PF) 250 MCG/5ML IJ SOLN
INTRAMUSCULAR | Status: AC
  Filled 2021-02-22: qty 5

## 2021-02-22 MED ORDER — SUCCINYLCHOLINE CHLORIDE 200 MG/10ML IV SOSY
PREFILLED_SYRINGE | INTRAVENOUS | Status: AC
  Filled 2021-02-22: qty 10

## 2021-02-22 MED ORDER — ONDANSETRON HCL 4 MG/2ML IJ SOLN: 4.0000 mg | Freq: Four times a day (QID) | INTRAMUSCULAR | Status: DC | PRN

## 2021-02-22 MED ORDER — SODIUM CHLORIDE 0.9% FLUSH: 3.0000 mL | INTRAVENOUS | Status: DC | PRN

## 2021-02-22 MED ORDER — PHENOL 1.4 % MT LIQD: 1.0000 | OROMUCOSAL | Status: DC | PRN

## 2021-02-22 MED ORDER — LIDOCAINE 2% (20 MG/ML) 5 ML SYRINGE
INTRAMUSCULAR | Status: AC
  Filled 2021-02-22: qty 5

## 2021-02-22 MED ORDER — PROPOFOL 10 MG/ML IV BOLUS
INTRAVENOUS | Status: DC | PRN
Start: 2021-02-22 — End: 2021-02-22
  Administered 2021-02-22: 200 mg via INTRAVENOUS

## 2021-02-22 MED ORDER — GLYCOPYRROLATE PF 0.2 MG/ML IJ SOSY
PREFILLED_SYRINGE | INTRAMUSCULAR | Status: AC
  Filled 2021-02-22: qty 1

## 2021-02-22 MED ORDER — GLYCOPYRROLATE PF 0.2 MG/ML IJ SOSY
PREFILLED_SYRINGE | INTRAMUSCULAR | Status: DC | PRN
  Administered 2021-02-22: .2 mg via INTRAVENOUS

## 2021-02-22 MED ORDER — ONDANSETRON HCL 4 MG/2ML IJ SOLN
INTRAMUSCULAR | Status: DC | PRN
  Administered 2021-02-22: 4 mg via INTRAVENOUS

## 2021-02-22 MED ORDER — LIDOCAINE 2% (20 MG/ML) 5 ML SYRINGE
INTRAMUSCULAR | Status: DC | PRN
  Administered 2021-02-22: 60 mg via INTRAVENOUS

## 2021-02-22 MED ORDER — ACETAMINOPHEN 500 MG PO TABS: 1000.0000 mg | ORAL_TABLET | Freq: Once | ORAL | Status: DC

## 2021-02-22 MED ORDER — ONDANSETRON HCL 4 MG/2ML IJ SOLN
INTRAMUSCULAR | Status: AC
  Filled 2021-02-22: qty 2

## 2021-02-22 MED ORDER — BUPIVACAINE LIPOSOME 1.3 % IJ SUSP
INTRAMUSCULAR | Status: DC | PRN
  Administered 2021-02-22: 20 mL

## 2021-02-22 MED ORDER — CEFAZOLIN SODIUM-DEXTROSE 2-4 GM/100ML-% IV SOLN
2.0000 g | INTRAVENOUS | Status: AC
  Administered 2021-02-22: 2 g via INTRAVENOUS
  Filled 2021-02-22: qty 100

## 2021-02-22 MED ORDER — PHENYLEPHRINE HCL-NACL 20-0.9 MG/250ML-% IV SOLN
INTRAVENOUS | Status: DC | PRN
  Administered 2021-02-22: 100 ug/min via INTRAVENOUS

## 2021-02-22 MED ORDER — ROCURONIUM BROMIDE 10 MG/ML (PF) SYRINGE
PREFILLED_SYRINGE | INTRAVENOUS | Status: AC
  Filled 2021-02-22: qty 10

## 2021-02-22 MED ORDER — SODIUM CHLORIDE 0.9 % IV SOLN
250.0000 mL | INTRAVENOUS | Status: DC
  Administered 2021-02-22: 250 mL via INTRAVENOUS

## 2021-02-22 MED ORDER — LEVOTHYROXINE SODIUM 100 MCG PO TABS
100.0000 ug | ORAL_TABLET | Freq: Every day | ORAL | Status: DC
  Administered 2021-02-23: 100 ug via ORAL
  Filled 2021-02-22: qty 1

## 2021-02-22 MED ORDER — ORAL CARE MOUTH RINSE: 15.0000 mL | Freq: Once | OROMUCOSAL | Status: AC

## 2021-02-22 MED ORDER — PROPOFOL 10 MG/ML IV BOLUS
INTRAVENOUS | Status: AC
  Filled 2021-02-22: qty 20

## 2021-02-22 SURGICAL SUPPLY — 91 items
AGENT HMST KT MTR STRL THRMB (HEMOSTASIS)
APL SKNCLS STERI-STRIP NONHPOA (GAUZE/BANDAGES/DRESSINGS) ×1
BAG COUNTER SPONGE SURGICOUNT (BAG) ×3 IMPLANT
BAG SPNG CNTER NS LX DISP (BAG) ×1
BENZOIN TINCTURE PRP APPL 2/3 (GAUZE/BANDAGES/DRESSINGS) ×3 IMPLANT
BLADE CLIPPER SURG (BLADE) IMPLANT
BUR PRESCISION 1.7 ELITE (BURR) ×3 IMPLANT
BUR ROUND FLUTED 5 RND (BURR) ×3 IMPLANT
BUR ROUND PRECISION 4.0 (BURR) IMPLANT
BUR SABER RD CUTTING 3.0 (BURR) IMPLANT
CAGE SABLE 10X30 9-16 8D (Cage) ×1 IMPLANT
CANNULA GRAFT BNE VG PRE-FILL (Bone Implant) IMPLANT
CARTRIDGE OIL MAESTRO DRILL (MISCELLANEOUS) ×2 IMPLANT
CLSR STERI-STRIP ANTIMIC 1/2X4 (GAUZE/BANDAGES/DRESSINGS) ×1 IMPLANT
CNTNR URN SCR LID CUP LEK RST (MISCELLANEOUS) ×2 IMPLANT
CONT SPEC 4OZ STRL OR WHT (MISCELLANEOUS) ×2
COVER BACK TABLE 60X90IN (DRAPES) ×3 IMPLANT
COVER MAYO STAND STRL (DRAPES) ×6 IMPLANT
COVER SURGICAL LIGHT HANDLE (MISCELLANEOUS) ×3 IMPLANT
DIFFUSER DRILL AIR PNEUMATIC (MISCELLANEOUS) ×3 IMPLANT
DISPENSER GRAFT BNE VG (MISCELLANEOUS) IMPLANT
DISPENSER VIVIGEN BONE GRAFT (MISCELLANEOUS) ×2 IMPLANT
DRAIN CHANNEL 15F RND FF W/TCR (WOUND CARE) IMPLANT
DRAPE C-ARM 42X72 X-RAY (DRAPES) ×3 IMPLANT
DRAPE C-ARMOR (DRAPES) IMPLANT
DRAPE POUCH INSTRU U-SHP 10X18 (DRAPES) ×3 IMPLANT
DRAPE SURG 17X23 STRL (DRAPES) ×12 IMPLANT
DURAPREP 26ML APPLICATOR (WOUND CARE) ×3 IMPLANT
ELECT BLADE 4.0 EZ CLEAN MEGAD (MISCELLANEOUS) ×2
ELECT CAUTERY BLADE 6.4 (BLADE) ×3 IMPLANT
ELECT REM PT RETURN 9FT ADLT (ELECTROSURGICAL) ×2
ELECTRODE BLDE 4.0 EZ CLN MEGD (MISCELLANEOUS) ×2 IMPLANT
ELECTRODE REM PT RTRN 9FT ADLT (ELECTROSURGICAL) ×2 IMPLANT
EVACUATOR SILICONE 100CC (DRAIN) IMPLANT
FILTER STRAW FLUID ASPIR (MISCELLANEOUS) ×3 IMPLANT
GAUZE 4X4 16PLY ~~LOC~~+RFID DBL (SPONGE) ×3 IMPLANT
GAUZE SPONGE 4X4 12PLY STRL (GAUZE/BANDAGES/DRESSINGS) ×3 IMPLANT
GAUZE SPONGE 4X4 12PLY STRL LF (GAUZE/BANDAGES/DRESSINGS) ×1 IMPLANT
GLOVE SRG 8 PF TXTR STRL LF DI (GLOVE) ×2 IMPLANT
GLOVE SURG ENC MOIS LTX SZ6.5 (GLOVE) ×3 IMPLANT
GLOVE SURG ENC MOIS LTX SZ8 (GLOVE) ×3 IMPLANT
GLOVE SURG UNDER POLY LF SZ7 (GLOVE) ×3 IMPLANT
GLOVE SURG UNDER POLY LF SZ8 (GLOVE) ×2
GOWN STRL REUS W/ TWL LRG LVL3 (GOWN DISPOSABLE) ×4 IMPLANT
GOWN STRL REUS W/ TWL XL LVL3 (GOWN DISPOSABLE) ×2 IMPLANT
GOWN STRL REUS W/TWL LRG LVL3 (GOWN DISPOSABLE) ×4
GOWN STRL REUS W/TWL XL LVL3 (GOWN DISPOSABLE) ×2
GRAFT BONE CANNULA VIVIGEN 3 (Bone Implant) ×6 IMPLANT
IV CATH 14GX2 1/4 (CATHETERS) ×3 IMPLANT
KIT BASIN OR (CUSTOM PROCEDURE TRAY) ×3 IMPLANT
KIT POSITION SURG JACKSON T1 (MISCELLANEOUS) ×3 IMPLANT
KIT TURNOVER KIT B (KITS) ×3 IMPLANT
MARKER SKIN DUAL TIP RULER LAB (MISCELLANEOUS) ×6 IMPLANT
NDL 18GX1X1/2 (RX/OR ONLY) (NEEDLE) ×2 IMPLANT
NDL HYPO 25GX1X1/2 BEV (NEEDLE) ×2 IMPLANT
NDL SPNL 18GX3.5 QUINCKE PK (NEEDLE) ×4 IMPLANT
NEEDLE 18GX1X1/2 (RX/OR ONLY) (NEEDLE) ×2 IMPLANT
NEEDLE 22X1 1/2 (OR ONLY) (NEEDLE) ×6 IMPLANT
NEEDLE HYPO 25GX1X1/2 BEV (NEEDLE) ×2 IMPLANT
NEEDLE SPNL 18GX3.5 QUINCKE PK (NEEDLE) ×4 IMPLANT
NS IRRIG 1000ML POUR BTL (IV SOLUTION) ×3 IMPLANT
OIL CARTRIDGE MAESTRO DRILL (MISCELLANEOUS) ×2
PACK LAMINECTOMY ORTHO (CUSTOM PROCEDURE TRAY) ×3 IMPLANT
PACK UNIVERSAL I (CUSTOM PROCEDURE TRAY) ×3 IMPLANT
PAD ARMBOARD 7.5X6 YLW CONV (MISCELLANEOUS) ×6 IMPLANT
PATTIES SURGICAL .5 X1 (DISPOSABLE) ×3 IMPLANT
PATTIES SURGICAL .5X1.5 (GAUZE/BANDAGES/DRESSINGS) ×3 IMPLANT
ROD PRE LORDOSED 5.5X45 (Rod) ×2 IMPLANT
SCREW SET SINGLE INNER (Screw) ×4 IMPLANT
SCREW VIPER CORT FIX 6.00X30 (Screw) ×2 IMPLANT
SCREW VIPER CORT FIX 6X35 (Screw) ×2 IMPLANT
SPONGE INTESTINAL PEANUT (DISPOSABLE) ×3 IMPLANT
SPONGE SURGIFOAM ABS GEL 100 (HEMOSTASIS) ×3 IMPLANT
STRIP CLOSURE SKIN 1/2X4 (GAUZE/BANDAGES/DRESSINGS) ×6 IMPLANT
SURGIFLO W/THROMBIN 8M KIT (HEMOSTASIS) IMPLANT
SUT MNCRL AB 4-0 PS2 18 (SUTURE) ×3 IMPLANT
SUT VIC AB 0 CT1 18XCR BRD 8 (SUTURE) ×2 IMPLANT
SUT VIC AB 0 CT1 8-18 (SUTURE) ×2
SUT VIC AB 1 CT1 18XCR BRD 8 (SUTURE) ×2 IMPLANT
SUT VIC AB 1 CT1 8-18 (SUTURE) ×2
SUT VIC AB 2-0 CT2 18 VCP726D (SUTURE) ×3 IMPLANT
SYR 20ML LL LF (SYRINGE) ×6 IMPLANT
SYR BULB IRRIG 60ML STRL (SYRINGE) ×3 IMPLANT
SYR CONTROL 10ML LL (SYRINGE) ×6 IMPLANT
SYR TB 1ML LUER SLIP (SYRINGE) ×3 IMPLANT
TAP EXPEDIUM DL 5.0 (INSTRUMENTS) ×1 IMPLANT
TAP EXPEDIUM DL 6.0 (INSTRUMENTS) ×1 IMPLANT
TAPE CLOTH SURG 4X10 WHT LF (GAUZE/BANDAGES/DRESSINGS) ×1 IMPLANT
TRAY FOLEY MTR SLVR 16FR STAT (SET/KITS/TRAYS/PACK) ×3 IMPLANT
WATER STERILE IRR 1000ML POUR (IV SOLUTION) ×3 IMPLANT
YANKAUER SUCT BULB TIP NO VENT (SUCTIONS) ×3 IMPLANT

## 2021-02-22 NOTE — Anesthesia Postprocedure Evaluation (Signed)
Anesthesia Post Note  Patient: TAJAY MUZZY  Procedure(s) Performed: LEFT-SIDED LUMBAR 3 - LUMBAR 4 TRANSFORAMINAL LUMBAR INTERBODY FUSION WITH INSTRUMENTATION AND  ALLOGRAFT (Left)     Patient location during evaluation: PACU Anesthesia Type: General Level of consciousness: awake and alert Pain management: pain level controlled Vital Signs Assessment: post-procedure vital signs reviewed and stable Respiratory status: spontaneous breathing, nonlabored ventilation and respiratory function stable Cardiovascular status: blood pressure returned to baseline and stable Postop Assessment: no apparent nausea or vomiting Anesthetic complications: no   No notable events documented.  Last Vitals:  Vitals:   02/22/21 1315 02/22/21 1341  BP: 111/60 112/73  Pulse: (!) 58 62  Resp: 10 20  Temp: 36.8 C 36.7 C  SpO2: 100% 94%    Last Pain:  Vitals:   02/22/21 1300  TempSrc:   PainSc: Asleep                 Kylii Ennis,W. EDMOND

## 2021-02-22 NOTE — Op Note (Addendum)
PATIENT NAME: Jesse Baker   MEDICAL RECORD NO.:   [id number]    DATE OF BIRTH: [birth year]   DATE OF PROCEDURE: 02/22/2021                                OPERATIVE REPORT     PREOPERATIVE DIAGNOSES: 1. Left-sided lumbar radiculopathy. 2. Severe L3-4 stenosis. 3. L3-4 retrolisthesis   POSTOPERATIVE DIAGNOSES: 1. Left-sided lumbar radiculopathy. 2. Severe L3-4 stenosis. 3. L3-4 retrolisthesis   PROCEDURES: 1. L3-4 decompression, including bilateral partial facetectomy and lateral recess decompression 2. left-sided L3-4 transforaminal lumbar interbody fusion. 3. Right-sided L3-4 posterolateral fusion. 4. Insertion of interbody device x1 (Globus expandable intervertebral spacer). 5. Placement of posterior instrumentation at L3, L4 bilaterally. 6. Use of local autograft. 7. Use of morselized allograft - ViviGen. 8. Intraoperative use of fluoroscopy.   SURGEON:  Estill Bamberg, MD.   ASSISTANTJason Coop, PA-C.   ANESTHESIA:  General endotracheal anesthesia.   COMPLICATIONS:  None.   DISPOSITION:  Stable.   ESTIMATED BLOOD LOSS:   Minimal   INDICATIONS FOR SURGERY:  Briefly, Jesse Baker is a pleasant 56 y.o. -year-old patient who did present to me with severe and ongoing pain in the left leg. I did feel that the symptoms were secondary to the findings noted above.   The patient failed conservative care and did wish to proceed with the procedure  noted above.   OPERATIVE DETAILS:  On 02/22/2021, the patient was brought to surgery and general endotracheal anesthesia was administered.  The patient was placed prone on a well-padded flat Jackson bed with a spinal frame.  Antibiotics were given and a time-out procedure was performed. The back was prepped and draped in the usual fashion.  A midline incision was made overlying the L3-4 intervertebral space.  The fascia was incised at the midline.  The paraspinal musculature was bluntly swept laterally.  Anatomic  landmarks for the pedicles were exposed. Using fluoroscopy, I did cannulate the L3 and L4 pedicles bilaterally, using a medial to lateral cortical trajectory technique.  On the right side, the posterolateral gutter and right facet joint at L3-4 was decorticated and 6 x 30 mm screws were placed and a 45-mm rod was placed and distraction was applied across the rod on the right side.  On the left side, the cannulated pedicle holes were filled with bone wax.  I then proceeded with the decompressive aspect of the procedure.  Specifically, a bilateral partial facetectomy was performed,, removing significant bony overgrowth, decompressing the right and left lateral recess.  On the left side, I was able to expose the exiting left L3 nerve, in addition to the traversing left L4 nerve. With an assistant holding medial retraction of the traversing left L4 nerve, I did perform an annulotomy at the posterolateral aspect of the L3-4 intervertebral space.  I then used a series of curettes and pituitary rongeurs to perform a thorough and complete intervertebral diskectomy.  The intervertebral space was then liberally packed with autograft as well as allograft in the form of ViviGen, as was the appropriate-sized intervertebral spacer.  The spacer was then tamped into position in the usual fashion.  The spacer was then expanded to approximately  13.5 mm in height. I was very pleased with the press-fit of the spacer.  I then placed 6 x 30 mm screws on the right at L3 and L4.  A 45-mm rod was then placed and  caps were placed. The distraction was then released on the contralateral right side.  All 4 caps were then locked.  The wound was copiously irrigated with a total of approximately 3 L prior to placing the bone graft.  Additional autograft and allograft were then packed into the posterolateral gutter on the right side to help aid in the L3-4 fusion.  The wound was explored for any undue bleeding and there was no  substantial bleeding encountered.  Gel-Foam was placed over the laminectomy site.  The wound was then closed in layers using #1 Vicryl followed by 2-0 Vicryl, followed by 4-0 Monocryl.  Benzoin and Steri-Strips were applied followed by sterile dressing.    Of note, Jason Coop was my assistant throughout surgery, and did aid in retraction, suctioning, decompression, placement of the hardware, and closure.       Estill Bamberg, MD

## 2021-02-22 NOTE — H&P (Addendum)
PREOPERATIVE H&P  Chief Complaint: Left leg pain  HPI: Jesse Baker is a 56 y.o. male who presents with ongoing pain in the left leg  MRI reveals severe spinal stenosis and instability at L3/4  Patient has failed multiple forms of conservative care and continues to have pain (see office notes for additional details regarding the patient's full course of treatment)  Past Medical History:  Diagnosis Date   Allergy    Headache    Hyperlipidemia    Hypertension    Hypothyroidism    Seasonal allergies    TIA (transient ischemic attack) 07/11/2020   Past Surgical History:  Procedure Laterality Date   BACK SURGERY     2012, lumbar discectomy    WISDOM TOOTH EXTRACTION     Social History   Socioeconomic History   Marital status: Married    Spouse name: Not on file   Number of children: Not on file   Years of education: Not on file   Highest education level: Not on file  Occupational History   Not on file  Tobacco Use   Smoking status: Former   Smokeless tobacco: Never  Vaping Use   Vaping Use: Never used  Substance and Sexual Activity   Alcohol use: Not Currently   Drug use: No   Sexual activity: Yes  Other Topics Concern   Not on file  Social History Narrative   Right handed   Social Determinants of Health   Financial Resource Strain: Not on file  Food Insecurity: Not on file  Transportation Needs: Not on file  Physical Activity: Not on file  Stress: Not on file  Social Connections: Not on file   Family History  Problem Relation Age of Onset   Hypertension Mother    Parkinsonism Paternal Grandmother    Heart attack Paternal Grandmother    Cancer Paternal Grandfather        possible prostate cancer   Stroke Neg Hx    No Known Allergies Prior to Admission medications   Medication Sig Start Date End Date Taking? Authorizing Provider  acetaminophen (TYLENOL) 500 MG tablet Take 500-1,000 mg by mouth every 6 (six) hours as needed (pain.).   Yes  [provider]  amLODipine (NORVASC) 10 MG tablet TAKE 1 TABLET BY MOUTH EVERY DAY Patient taking differently: Take 10 mg by mouth every evening. 09/19/20  Yes Corky Crafts, MD  atorvastatin (LIPITOR) 80 MG tablet TAKE 1 TABLET BY MOUTH EVERY DAY 02/15/21  Yes Everlena Cooper, Adam R, DO  clopidogrel (PLAVIX) 75 MG tablet TAKE 1 TABLET BY MOUTH EVERY DAY 11/29/20  Yes Jaffe, Adam R, DO  fluticasone (FLONASE) 50 MCG/ACT nasal spray Place 2 sprays into both nostrils in the morning.   Yes [provider]  ibuprofen (ADVIL) 200 MG tablet Take 400-800 mg by mouth every 6 (six) hours as needed for mild pain or headache.   Yes [provider]  levothyroxine (SYNTHROID) 100 MCG tablet Take 1 tablet (100 mcg total) by mouth daily before breakfast. 02/16/21  Yes Corky Crafts, MD  loratadine (CLARITIN) 10 MG tablet Take 10 mg by mouth in the morning.   Yes [provider]  losartan (COZAAR) 100 MG tablet TAKE 1 TABLET BY MOUTH EVERY DAY Patient taking differently: Take 100 mg by mouth every evening. 09/19/20  Yes Corky Crafts, MD  meloxicam (MOBIC) 15 MG tablet Take 15 mg by mouth daily. 01/31/21  Yes [provider]  metoprolol succinate (TOPROL-XL)  100 MG 24 hr tablet TAKE 1 TABLET BY MOUTH EVERY DAY 09/19/20  Yes Corky Crafts, MD  sildenafil (REVATIO) 20 MG tablet Take 3-5 tablets 1 hour prior to sexual activity Patient taking differently: Take 60-100 mg by mouth See admin instructions. Take 60-100 mg by mouth one hour prior to sexual activity 06/05/18  Yes Corky Crafts, MD     All other systems have been reviewed and were otherwise negative with the exception of those mentioned in the HPI and as above.  Physical Exam: Vitals:   02/22/21 0647  BP: 126/76  Pulse: 73  Resp: 18  Temp: 98.1 F (36.7 C)  SpO2: 97%    Body mass index is 32.5 kg/m.  General: Alert, no acute distress Cardiovascular: No pedal edema Respiratory:  No cyanosis, no use of accessory musculature Skin: No lesions in the area of chief complaint Neurologic: Sensation intact distally Psychiatric: Patient is competent for consent with normal mood and affect Lymphatic: No axillary or cervical lymphadenopathy   Assessment/Plan: SEVERE STENOSIS AND INSTABILITY AT L3/4 Plan for Procedure(s): LEFT-SIDED LUMBAR 3 - LUMBAR 4 TRANSFORAMINAL LUMBAR INTERBODY FUSION WITH INSTRUMENTATION AND  ALLOGRAFT   Jackelyn Hoehn, MD 02/22/2021 7:13 AM

## 2021-02-22 NOTE — Transfer of Care (Signed)
Immediate Anesthesia Transfer of Care Note  Patient: Jesse Baker  Procedure(s) Performed: LEFT-SIDED LUMBAR 3 - LUMBAR 4 TRANSFORAMINAL LUMBAR INTERBODY FUSION WITH INSTRUMENTATION AND  ALLOGRAFT (Left)  Patient Location: PACU  Anesthesia Type:General  Level of Consciousness: awake, alert  and oriented  Airway & Oxygen Therapy: Patient Spontanous Breathing  Post-op Assessment: Post -op Vital signs reviewed and stable  Post vital signs: stable  Last Vitals:  Vitals Value Taken Time  BP    Temp    Pulse 86 02/22/21 1211  Resp    SpO2 100 % 02/22/21 1211  Vitals shown include unvalidated device data.  Last Pain:  Vitals:   02/22/21 0708  TempSrc:   PainSc: 4       Patients Stated Pain Goal: 2 (02/22/21 0708)  Complications: No notable events documented.

## 2021-02-22 NOTE — Anesthesia Procedure Notes (Signed)
Procedure Name: Intubation Date/Time: 02/22/2021 8:48 AM Performed by: Michele Rockers, CRNA Pre-anesthesia Checklist: Patient identified, Patient being monitored, Timeout performed, Emergency Drugs available and Suction available Patient Re-evaluated:Patient Re-evaluated prior to induction Oxygen Delivery Method: Circle System Utilized Preoxygenation: Pre-oxygenation with 100% oxygen Induction Type: IV induction Ventilation: Mask ventilation without difficulty and Oral airway inserted - appropriate to patient size Laryngoscope Size: Mac and 4 Grade View: Grade I Tube type: Oral Tube size: 8.0 mm Number of attempts: 1 Airway Equipment and Method: Stylet Placement Confirmation: ETT inserted through vocal cords under direct vision, positive ETCO2 and breath sounds checked- equal and bilateral Secured at: 23 cm Tube secured with: Tape Dental Injury: Teeth and Oropharynx as per pre-operative assessment  Difficulty Due To: Difficult Airway- due to large tongue and Difficult Airway- due to reduced neck mobility

## 2021-02-23 ENCOUNTER — Ambulatory Visit: Payer: 59 | Admitting: Neurology

## 2021-02-23 ENCOUNTER — Ambulatory Visit: Payer: 59 | Admitting: Physician Assistant

## 2021-02-23 ENCOUNTER — Encounter (HOSPITAL_COMMUNITY): Payer: Self-pay | Admitting: Orthopedic Surgery

## 2021-02-23 DIAGNOSIS — M48061 Spinal stenosis, lumbar region without neurogenic claudication: Secondary | ICD-10-CM | POA: Diagnosis not present

## 2021-02-23 MED ORDER — METHOCARBAMOL 500 MG PO TABS: 500.0000 mg | ORAL_TABLET | Freq: Four times a day (QID) | ORAL | 2 refills | Status: DC | PRN

## 2021-02-23 MED ORDER — OXYCODONE-ACETAMINOPHEN 5-325 MG PO TABS: 1.0000 | ORAL_TABLET | ORAL | 0 refills | Status: DC | PRN

## 2021-02-23 NOTE — Progress Notes (Signed)
Patient alert and oriented, mae's well, voiding adequate amount of urine, swallowing without difficulty, no c/o pain at time of discharge. Patient discharged home with family. Script and discharged instructions given to patient. Patient and family stated understanding of instructions given. Patient has an appointment with Dr. Dumonski 

## 2021-02-23 NOTE — Evaluation (Signed)
Occupational Therapy Evaluation Patient Details Name: Jesse Baker MRN: [id number] DOB: 1965/08/22 Today's Date: 02/23/2021   History of Present Illness Pt adm 1/18 for L3-4 decompression and fusion. PMH - spinal stenosis, L3-4 retrolisthesis, HTN, TIA   Clinical Impression   Pt admitted for procedure listed above. PTA pt reported that he was independent with all ADL's and working as an Engineer, site. At this time, pt presents with some pain limitation, however he is able to complete all basic ADL's and functional mobility with no assist.  Pt has no further OT needs at this time and acute OT will sign off.      Recommendations for follow up therapy are one component of a multi-disciplinary discharge planning process, led by the attending physician.  Recommendations may be updated based on patient status, additional functional criteria and insurance authorization.   Follow Up Recommendations  No OT follow up    Assistance Recommended at Discharge None  Patient can return home with the following      Functional Status Assessment  Patient has had a recent decline in their functional status and demonstrates the ability to make significant improvements in function in a reasonable and predictable amount of time.  Equipment Recommendations  None recommended by OT    Recommendations for Other Services       Precautions / Restrictions Precautions Precautions: Back Precaution Booklet Issued: Yes (comment) Precaution Comments: Reviewed precautions and compensatory strategies Required Braces or Orthoses: Spinal Brace Spinal Brace: Thoracolumbosacral orthotic;Applied in sitting position Restrictions Weight Bearing Restrictions: No      Mobility Bed Mobility Overal bed mobility: Modified Independent             General bed mobility comments: Flat bed    Transfers Overall transfer level: Independent Equipment used: None                      Balance Overall  balance assessment: No apparent balance deficits (not formally assessed)                                         ADL either performed or assessed with clinical judgement   ADL Overall ADL's : Modified independent                                       General ADL Comments: Pt able to complete all ADL's with no difficulties     Vision Baseline Vision/History: 0 No visual deficits Ability to See in Adequate Light: 0 Adequate Patient Visual Report: No change from baseline Vision Assessment?: No apparent visual deficits     Perception     Praxis      Pertinent Vitals/Pain Pain Assessment Pain Assessment: Faces Faces Pain Scale: Hurts a little bit Pain Location: back Pain Descriptors / Indicators: Operative site guarding, Sore Pain Intervention(s): Monitored during session, Repositioned     Hand Dominance Right   Extremity/Trunk Assessment Upper Extremity Assessment Upper Extremity Assessment: Overall WFL for tasks assessed   Lower Extremity Assessment Lower Extremity Assessment: Defer to PT evaluation   Cervical / Trunk Assessment Cervical / Trunk Assessment: Back Surgery   Communication Communication Communication: No difficulties   Cognition Arousal/Alertness: Awake/alert Behavior During Therapy: WFL for tasks assessed/performed Overall Cognitive Status: Within Functional Limits for tasks assessed  General Comments  VSS on RA, dressing dry and intact    Exercises     Shoulder Instructions      Home Living Family/patient expects to be discharged to:: Private residence Living Arrangements: Spouse/significant other Available Help at Discharge: Family;Available 24 hours/day Type of Home: House Home Access: Stairs to enter [employer] of Steps: 3 Entrance Stairs-Rails: None Home Layout: One level     Bathroom Shower/Tub: Chief Strategy Officer:  Handicapped height     Home Equipment: Grab bars - tub/shower;Adaptive equipment Adaptive Equipment: Reacher        Prior Functioning/Environment Prior Level of Function : Independent/Modified Independent                        OT Problem List: Decreased strength;Decreased activity tolerance;Pain;Decreased knowledge of precautions      OT Treatment/Interventions:      OT Goals(Current goals can be found in the care plan section) Acute Rehab OT Goals Patient Stated Goal: To go home OT Goal Formulation: All assessment and education complete, DC therapy Time For Goal Achievement: 02/23/21 Potential to Achieve Goals: Good  OT Frequency:      Co-evaluation              AM-PAC OT "6 Clicks" Daily Activity     Outcome Measure Help from another person eating meals?: None Help from another person taking care of personal grooming?: None Help from another person toileting, which includes using toliet, bedpan, or urinal?: None Help from another person bathing (including washing, rinsing, drying)?: None Help from another person to put on and taking off regular upper body clothing?: None Help from another person to put on and taking off regular lower body clothing?: None 6 Click Score: 24   End of Session Equipment Utilized During Treatment: Back brace Nurse Communication: Mobility status  Activity Tolerance: Patient tolerated treatment well Patient left: in bed;with call bell/phone within reach  OT Visit Diagnosis: Unsteadiness on feet (R26.81);Other abnormalities of gait and mobility (R26.89);Muscle weakness (generalized) (M62.81)                Time: 7517-0017 OT Time Calculation (min): 19 min Charges:  OT General Charges $OT Visit: 1 Visit OT Evaluation $OT Eval Low Complexity: 1 Low  Matther Labell H., OTR/L Acute Rehabilitation  [name] Elane Bing Plume 02/23/2021, 9:48 AM

## 2021-02-23 NOTE — Evaluation (Signed)
Physical Therapy Evaluation Patient Details Name: Jesse Baker MRN: [id number] DOB: December 21, 1965 Today's Date: 02/23/2021  History of Present Illness  Pt adm 1/18 for L3-4 decompression and fusion. PMH - spinal stenosis, L3-4 retrolisthesis, HTN, TIA  Clinical Impression  Pt doing well with mobility and no further PT needed.  Ready for dc from PT standpoint. Instructed to walk for exercise and that he can perform heel raises and mini squats for LE strength.         Recommendations for follow up therapy are one component of a multi-disciplinary discharge planning process, led by the attending physician.  Recommendations may be updated based on patient status, additional functional criteria and insurance authorization.  Follow Up Recommendations No PT follow up    Assistance Recommended at Discharge None  Patient can return home with the following  Other (comment) (No assist for mobility)    Equipment Recommendations None recommended by PT  Recommendations for Other Services       Functional Status Assessment Patient has not had a recent decline in their functional status     Precautions / Restrictions Precautions Precautions: Back Precaution Booklet Issued: Yes (comment) Required Braces or Orthoses: Spinal Brace Spinal Brace: Thoracolumbosacral orthotic;Applied in sitting position      Mobility  Bed Mobility Overal bed mobility: Modified Independent             General bed mobility comments: Flat bed    Transfers Overall transfer level: Independent Equipment used: None                    Ambulation/Gait Ambulation/Gait assistance: Independent Gait Distance (Feet): 500 Feet Assistive device: None Gait Pattern/deviations: WFL(Within Functional Limits) Gait velocity: normal Gait velocity interpretation: >4.37 ft/sec, indicative of normal walking speed   General Gait Details: Steady gait without difficulty  Stairs Stairs: Yes Stairs assistance:  Modified independent (Device/Increase time) Stair Management: Two rails, Alternating pattern, Forwards Number of Stairs: 10    Wheelchair Mobility    Modified Rankin (Stroke Patients Only)       Balance Overall balance assessment: No apparent balance deficits (not formally assessed)                                           Pertinent Vitals/Pain Pain Assessment Pain Assessment: Faces Faces Pain Scale: Hurts a little bit Pain Location: back Pain Descriptors / Indicators: Operative site guarding, Sore Pain Intervention(s): Monitored during session    Home Living Family/patient expects to be discharged to:: Private residence Living Arrangements: Spouse/significant other Available Help at Discharge: Family;Available 24 hours/day Type of Home: House Home Access: Stairs to enter Entrance Stairs-Rails: None Entrance Stairs-Number of Steps: 3   Home Layout: One level Home Equipment: Grab bars - tub/shower;Adaptive equipment      Prior Function Prior Level of Function : Independent/Modified Independent                     Hand Dominance   Dominant Hand: Right    Extremity/Trunk Assessment   Upper Extremity Assessment Upper Extremity Assessment: Defer to OT evaluation    Lower Extremity Assessment Lower Extremity Assessment: Overall WFL for tasks assessed       Communication   Communication: No difficulties  Cognition Arousal/Alertness: Awake/alert Behavior During Therapy: WFL for tasks assessed/performed Overall Cognitive Status: Within Functional Limits for tasks assessed  General Comments      Exercises     Assessment/Plan    PT Assessment Patient does not need any further PT services  PT Problem List         PT Treatment Interventions      PT Goals (Current goals can be found in the Care Plan section)  Acute Rehab PT Goals PT Goal Formulation: All assessment  and education complete, DC therapy    Frequency       Co-evaluation               AM-PAC PT "6 Clicks" Mobility  Outcome Measure Help needed turning from your back to your side while in a flat bed without using bedrails?: None Help needed moving from lying on your back to sitting on the side of a flat bed without using bedrails?: None Help needed moving to and from a bed to a chair (including a wheelchair)?: None Help needed standing up from a chair using your arms (e.g., wheelchair or bedside chair)?: None Help needed to walk in hospital room?: None Help needed climbing 3-5 steps with a railing? : None 6 Click Score: 24    End of Session Equipment Utilized During Treatment: Back brace Activity Tolerance: Patient tolerated treatment well Patient left: in bed   PT Visit Diagnosis: Other abnormalities of gait and mobility (R26.89)    Time: 5537-4827 PT Time Calculation (min) (ACUTE ONLY): 11 min   Charges:   PT Evaluation $PT Eval Low Complexity: 1 Low          [facility] PT Acute Rehabilitation Services Pager [phone] Office [phone]   [name] 02/23/2021, 8:57 AM

## 2021-02-23 NOTE — Progress Notes (Signed)
° ° °  Patient doing well  Denies arm pain   Physical Exam: Vitals:   02/22/21 2309 02/23/21 0306  BP: 110/72 122/79  Pulse: 80 75  Resp: 20 20  Temp: 98.2 F (36.8 C) 98 F (36.7 C)  SpO2: 98% 99%    Dressing in place NVI  POD #1 s/p L3/4 decompression and fusion  - up with PT/OT, encourage ambulation - Percocet for pain, Robaxin for muscle spasms - likely d/c home today with f/u in 2 weeks

## 2021-03-02 NOTE — Discharge Summary (Signed)
Patient ID: Jesse Baker MRN: [id number] DOB/AGE: [birth year] 58 y.o.  Admit date: 02/22/2021 Discharge date: 02/23/2021  Admission Diagnoses:  Principal Problem:   Radiculopathy   Discharge Diagnoses:  Same  Past Medical History:  Diagnosis Date   Allergy    Headache    Hyperlipidemia    Hypertension    Hypothyroidism    Seasonal allergies    TIA (transient ischemic attack) 07/11/2020    Surgeries: Procedure(s): LEFT-SIDED LUMBAR 3 - LUMBAR 4 TRANSFORAMINAL LUMBAR INTERBODY FUSION WITH INSTRUMENTATION AND  ALLOGRAFT on 02/22/2021   Consultants: None  Discharged Condition: Improved  Hospital Course: Jesse Baker is an 56 y.o. male who was admitted 02/22/2021 for operative treatment of Radiculopathy. Patient has severe unremitting pain that affects sleep, daily activities, and work/hobbies. After pre-op clearance the patient was taken to the operating room on 02/22/2021 and underwent  Procedure(s): LEFT-SIDED LUMBAR 3 - LUMBAR 4 TRANSFORAMINAL LUMBAR INTERBODY FUSION WITH INSTRUMENTATION AND  ALLOGRAFT.    Patient was given perioperative antibiotics:  Anti-infectives (From admission, onward)    Start     Dose/Rate Route Frequency Ordered Stop   02/22/21 1700  ceFAZolin (ANCEF) IVPB 2g/100 mL premix        2 g 200 mL/hr over 30 Minutes Intravenous Every 8 hours 02/22/21 1324 02/23/21 0108   02/22/21 0645  ceFAZolin (ANCEF) IVPB 2g/100 mL premix        2 g 200 mL/hr over 30 Minutes Intravenous On call to O.R. 02/22/21 7342 02/22/21 0910        Patient was given sequential compression devices, early ambulation to prevent DVT.  Patient benefited maximally from hospital stay and there were no complications.    Recent vital signs: BP (!) 144/80 (BP Location: Right Arm)    Pulse 83    Temp 97.9 F (36.6 C) (Oral)    Resp 18    Ht 6\' 3"  (1.905 m)    Wt 117.9 kg    SpO2 98%    BMI 32.50 kg/m    Discharge Medications:   Allergies as of 02/23/2021   No Known  Allergies      Medication List     STOP taking these medications    clopidogrel 75 MG tablet Commonly known as: PLAVIX       TAKE these medications    acetaminophen 500 MG tablet Commonly known as: TYLENOL Take 500-1,000 mg by mouth every 6 (six) hours as needed (pain.).   amLODipine 10 MG tablet Commonly known as: NORVASC TAKE 1 TABLET BY MOUTH EVERY DAY What changed: when to take this   atorvastatin 80 MG tablet Commonly known as: LIPITOR TAKE 1 TABLET BY MOUTH EVERY DAY   fluticasone 50 MCG/ACT nasal spray Commonly known as: FLONASE Place 2 sprays into both nostrils in the morning.   levothyroxine 100 MCG tablet Commonly known as: Synthroid Take 1 tablet (100 mcg total) by mouth daily before breakfast.   loratadine 10 MG tablet Commonly known as: CLARITIN Take 10 mg by mouth in the morning.   losartan 100 MG tablet Commonly known as: COZAAR TAKE 1 TABLET BY MOUTH EVERY DAY What changed: when to take this   methocarbamol 500 MG tablet Commonly known as: ROBAXIN Take 1 tablet (500 mg total) by mouth every 6 (six) hours as needed for muscle spasms.   metoprolol succinate 100 MG 24 hr tablet Commonly known as: TOPROL-XL TAKE 1 TABLET BY MOUTH EVERY DAY   oxyCODONE-acetaminophen 5-325 MG tablet Commonly known  as: PERCOCET/ROXICET Take 1-2 tablets by mouth every 4 (four) hours as needed for severe pain.   sildenafil 20 MG tablet Commonly known as: REVATIO Take 3-5 tablets 1 hour prior to sexual activity What changed:  how much to take how to take this when to take this additional instructions        Diagnostic Studies: DG Lumbar Spine 2-3 Views  Addendum Date: 02/23/2021   ADDENDUM REPORT: 02/23/2021 08:16 ADDENDUM: The fusion is at L3-L4, not at L4-L5 as originally described. Electronically Signed   By: Lesia Hausen M.D.   On: 02/23/2021 08:16   Result Date: 02/23/2021 CLINICAL DATA:  L4-L5 TLIF EXAM: LUMBAR SPINE - 2-3 VIEW COMPARISON:   Lumbar spine radiographs 02/22/2021 FINDINGS: Two C-arm fluoroscopic images were obtained intraoperatively and submitted for post operative interpretation. Postsurgical changes reflecting L4-L5 posterior instrumented fusion with interbody spacer are noted. Alignment is grossly within expected limits, without evidence of immediate complication. Fluoro time 1 minutes 31 seconds. Dose 31.23 mGy. Please see the performing provider's procedural report for further detail. IMPRESSION: Intraoperative fluoroscopic images as above. Electronically Signed: By: Lesia Hausen M.D. On: 02/22/2021 11:46   DG Lumbar Spine 1 View  Result Date: 02/22/2021 CLINICAL DATA:  Localization exam for lumbar spine surgery EXAM: LUMBAR SPINE - 1 VIEW COMPARISON:  06/28/2009 FINDINGS: Prior plain films demonstrate 5 lumbar vertebra. Cross-table lateral view 0900 hours the straight 2 metallic probes, which project dorsal to the spinous process of L3 and the superior L5 level. IMPRESSION: Lumbar localization images as above. Electronically Signed   By: Ulyses Southward M.D.   On: 02/22/2021 11:38   DG C-Arm 1-60 Min-No Report  Result Date: 02/22/2021 Fluoroscopy was utilized by the requesting physician.  No radiographic interpretation.   DG C-Arm 1-60 Min-No Report  Result Date: 02/22/2021 Fluoroscopy was utilized by the requesting physician.  No radiographic interpretation.   DG C-Arm 1-60 Min-No Report  Result Date: 02/22/2021 Fluoroscopy was utilized by the requesting physician.  No radiographic interpretation.    Disposition: Discharge disposition: 01-Home or Self Care      POD #1 s/p L3/4 decompression and fusion   - up with PT/OT, encourage ambulation - Percocet for pain, Robaxin for muscle spasms -Scripts for pain sent to pharmacy electronically  -D/C instructions sheet printed and in chart -D/C today  -F/U in office 2 weeks   Signed: Eilene Ghazi Catrice Zuleta 03/02/2021, 12:52 PM

## 2021-05-16 ENCOUNTER — Other Ambulatory Visit: Payer: Self-pay | Admitting: Neurology

## 2021-05-30 ENCOUNTER — Other Ambulatory Visit: Payer: Self-pay | Admitting: Neurology

## 2021-06-20 ENCOUNTER — Other Ambulatory Visit: Payer: Self-pay | Admitting: Interventional Cardiology

## 2021-07-15 ENCOUNTER — Other Ambulatory Visit: Payer: Self-pay | Admitting: Interventional Cardiology

## 2021-08-13 ENCOUNTER — Other Ambulatory Visit: Payer: Self-pay | Admitting: Neurology
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
## 2021-09-06 ENCOUNTER — Ambulatory Visit (INDEPENDENT_AMBULATORY_CARE_PROVIDER_SITE_OTHER): Payer: Commercial Managed Care - HMO | Admitting: Neurology

## 2021-09-06 ENCOUNTER — Encounter: Payer: Self-pay | Admitting: Neurology

## 2021-09-06 VITALS — BP 127/70 | HR 60 | Ht 75.0 in | Wt 263.8 lb

## 2021-09-06 DIAGNOSIS — R202 Paresthesia of skin: Secondary | ICD-10-CM

## 2021-09-06 DIAGNOSIS — R200 Anesthesia of skin: Secondary | ICD-10-CM

## 2021-09-06 NOTE — Patient Instructions (Signed)
I don't think you had a TIA.  Stop clopidogrel and restart aspirin 81mg  daily.  Likely pinched nerve.  Will check nerve study of arms.  Further recommendations pending results.  Follow up after testing.

## 2021-09-11 ENCOUNTER — Other Ambulatory Visit: Payer: Self-pay | Admitting: Internal Medicine

## 2021-09-11 DIAGNOSIS — Z87891 Personal history of nicotine dependence: Secondary | ICD-10-CM

## 2021-10-05 ENCOUNTER — Ambulatory Visit: Payer: Commercial Managed Care - HMO | Admitting: Neurology

## 2021-10-05 DIAGNOSIS — R202 Paresthesia of skin: Secondary | ICD-10-CM | POA: Diagnosis not present

## 2021-10-05 DIAGNOSIS — R200 Anesthesia of skin: Secondary | ICD-10-CM | POA: Diagnosis not present

## 2021-10-05 DIAGNOSIS — G5603 Carpal tunnel syndrome, bilateral upper limbs: Secondary | ICD-10-CM

## 2021-10-05 DIAGNOSIS — G5622 Lesion of ulnar nerve, left upper limb: Secondary | ICD-10-CM

## 2021-10-05 NOTE — Procedures (Signed)
[facility]  [address] [place], [address]  [place], [place] [address] Tel: [phone] Fax:  [phone] Test Date:  10/05/2021  Patient: Jesse Baker DOB: 1965-02-28 Physician: Nita Sickle, DO  Sex: Male Height: 6\' 3"  Ref Phys: , D.O.  ID#: [id number] [id number]   Technician:    Patient Complaints: This is a 56 year old man referred for evaluation of bilateral upper limb paresthesias.  NCV & EMG Findings: Extensive electrodiagnostic testing of the right upper extremity and additional studies of the left shows:  Right median and left ulnar sensory responses show prolonged latency (R3.9, L4.0 ms) and reduced amplitude (R12.2, L8.7 V).  Left median sensory response shows prolonged latency (4.1 ms).  Left radial and right ulnar sensory responses are within normal limits. Bilateral median and right ulnar motor responses are within normal limits.  Left ulnar motor response shows reduced amplitude (5.8 mV) and decreased conduction velocity (A Elbow-B Elbow, 42 m/s).  Chronic motor axonal loss changes are seen affecting the left first dorsal interosseous and abductor digiti minimi muscles, without accompanying active denervation.   Impression: Bilateral median neuropathy at or distal to the wrist, consistent with a clinical diagnosis of carpal tunnel syndrome.  Overall, these findings are moderate on the right and mild on the left. Left ulnar neuropathy with slowing across the elbow, with demyelinating and axonal features, moderate. There is no evidence of a cervical radiculopathy affecting the upper extremities.    ___________________________ 39, DO    Nerve Conduction Studies Anti Sensory Summary Table   Stim Site NR Peak (ms) Norm Peak (ms) O-P Amp (V) Norm O-P Amp  Left Median Anti Sensory (2nd Digit)  35C  Wrist    4.1 <3.6 20.3 >15  Right Median Anti Sensory (2nd Digit)  35C  Wrist    3.9 <3.6 12.2 >15  Left Radial Anti Sensory (Base 1st Digit)  35C   Wrist    2.5 <2.7 19.4 >14  Left Ulnar Anti Sensory (5th Digit)  35C  Wrist    4.0 <3.1 8.7 >10  Right Ulnar Anti Sensory (5th Digit)  35C  Wrist    3.1 <3.1 11.4 >10   Motor Summary Table   Stim Site NR Onset (ms) Norm Onset (ms) O-P Amp (mV) Norm O-P Amp Site1 Site2 Delta-0 (ms) Dist (cm) Vel (m/s) Norm Vel (m/s)  Left Median Motor (Abd Poll Brev)  35C  Wrist    3.7 <4.0 9.4 >6 Elbow Wrist 6.4 33.0 52 >50  Elbow    10.1  8.7         Right Median Motor (Abd Poll Brev)  35C  Wrist    3.7 <4.0 8.9 >6 Elbow Wrist 6.3 33.0 52 >50  Elbow    10.0  8.4         Left Ulnar Motor (Abd Dig Minimi)  35C  Wrist    2.8 <3.1 5.8 >7 B Elbow Wrist 4.6 24.0 52 >50  B Elbow    7.4  5.8  A Elbow B Elbow 2.4 10.0 42 >50  A Elbow    9.8  5.7         Right Ulnar Motor (Abd Dig Minimi)  35C  Wrist    2.6 <3.1 8.4 >7 B Elbow Wrist 4.9 25.0 51 >50  B Elbow    7.5  7.9  A Elbow B Elbow 1.9 10.0 53 >50  A Elbow    9.4  7.8  EMG   Side Muscle Ins Act Fibs Fasc Recrt Dur. Amp. Poly. Activation Comment  Right 1stDorInt Nml Nml Nml Nml Nml Nml Nml Nml N/A  Right Abd Poll Brev Nml Nml Nml Nml Nml Nml Nml Nml N/A  Right Biceps Nml Nml Nml Nml Nml Nml Nml Nml N/A  Right PronatorTeres Nml Nml Nml Nml Nml Nml Nml Nml N/A  Right Triceps Nml Nml Nml Nml Nml Nml Nml Nml N/A  Right Deltoid Nml Nml Nml Nml Nml Nml Nml Nml N/A  Left 1stDorInt Nml Nml Nml 1- 1+ 1+ 1+ Nml N/A  Left Abd Poll Brev Nml Nml Nml Nml Nml Nml Nml Nml N/A  Left PronatorTeres Nml Nml Nml Nml Nml Nml Nml Nml N/A  Left Biceps Nml Nml Nml Nml Nml Nml Nml Nml N/A  Left Triceps Nml Nml Nml Nml Nml Nml Nml Nml N/A  Left Deltoid Nml Nml Nml Nml Nml Nml Nml Nml N/A  Left ABD Dig Min Nml Nml Nml 1- 1+ 1+ 1+ Nml N/A  Left FlexCarpiUln Nml Nml Nml Nml Nml Nml Nml Nml N/A      Waveforms:

## 2021-10-12 ENCOUNTER — Other Ambulatory Visit: Payer: Self-pay | Admitting: Interventional Cardiology

## 2021-10-13 ENCOUNTER — Ambulatory Visit
Admission: RE | Admit: 2021-10-13 | Discharge: 2021-10-13 | Disposition: A | Discharge status: Home / Self Care | Payer: Commercial Managed Care - HMO | Source: Ambulatory Visit | Attending: Internal Medicine | Admitting: Internal Medicine

## 2021-10-13 DIAGNOSIS — Z87891 Personal history of nicotine dependence: Secondary | ICD-10-CM

## 2021-11-06 ENCOUNTER — Other Ambulatory Visit: Payer: Self-pay | Admitting: Interventional Cardiology

## 2021-11-18 ENCOUNTER — Other Ambulatory Visit: Payer: Self-pay | Admitting: Interventional Cardiology

## 2022-01-16 ENCOUNTER — Ambulatory Visit: Payer: Commercial Managed Care - HMO | Admitting: Neurology

## 2022-01-26 ENCOUNTER — Ambulatory Visit: Payer: Commercial Managed Care - HMO | Admitting: Neurology

## 2022-03-15 DIAGNOSIS — R350 Frequency of micturition: Secondary | ICD-10-CM | POA: Diagnosis not present

## 2022-03-15 DIAGNOSIS — E039 Hypothyroidism, unspecified: Secondary | ICD-10-CM | POA: Diagnosis not present

## 2022-03-15 DIAGNOSIS — E781 Pure hyperglyceridemia: Secondary | ICD-10-CM | POA: Diagnosis not present

## 2022-05-08 DIAGNOSIS — R634 Abnormal weight loss: Secondary | ICD-10-CM | POA: Diagnosis not present

## 2022-05-08 DIAGNOSIS — E1165 Type 2 diabetes mellitus with hyperglycemia: Secondary | ICD-10-CM | POA: Diagnosis not present

## 2022-05-24 ENCOUNTER — Other Ambulatory Visit: Payer: Self-pay | Admitting: Internal Medicine

## 2022-05-24 DIAGNOSIS — R634 Abnormal weight loss: Secondary | ICD-10-CM

## 2022-05-24 DIAGNOSIS — E1165 Type 2 diabetes mellitus with hyperglycemia: Secondary | ICD-10-CM

## 2022-05-25 ENCOUNTER — Ambulatory Visit
Admission: RE | Admit: 2022-05-25 | Discharge: 2022-05-25 | Disposition: A | Discharge status: Home / Self Care | Payer: 59 | Source: Ambulatory Visit | Attending: Internal Medicine | Admitting: Internal Medicine

## 2022-05-25 DIAGNOSIS — E119 Type 2 diabetes mellitus without complications: Secondary | ICD-10-CM | POA: Diagnosis not present

## 2022-05-25 DIAGNOSIS — R634 Abnormal weight loss: Secondary | ICD-10-CM | POA: Diagnosis not present

## 2022-05-25 DIAGNOSIS — R161 Splenomegaly, not elsewhere classified: Secondary | ICD-10-CM | POA: Diagnosis not present

## 2022-05-25 DIAGNOSIS — K8020 Calculus of gallbladder without cholecystitis without obstruction: Secondary | ICD-10-CM | POA: Diagnosis not present

## 2022-05-25 DIAGNOSIS — E1165 Type 2 diabetes mellitus with hyperglycemia: Secondary | ICD-10-CM

## 2022-05-25 MED ORDER — IOPAMIDOL (ISOVUE-300) INJECTION 61%
100.0000 mL | Freq: Once | INTRAVENOUS | Status: AC | PRN
  Administered 2022-05-25: 100 mL via INTRAVENOUS

## 2022-06-11 DIAGNOSIS — E7800 Pure hypercholesterolemia, unspecified: Secondary | ICD-10-CM | POA: Diagnosis not present

## 2022-06-11 DIAGNOSIS — F1029 Alcohol dependence with unspecified alcohol-induced disorder: Secondary | ICD-10-CM | POA: Diagnosis not present

## 2022-06-11 DIAGNOSIS — R634 Abnormal weight loss: Secondary | ICD-10-CM | POA: Diagnosis not present

## 2022-06-11 DIAGNOSIS — E781 Pure hyperglyceridemia: Secondary | ICD-10-CM | POA: Diagnosis not present

## 2022-06-11 DIAGNOSIS — I10 Essential (primary) hypertension: Secondary | ICD-10-CM | POA: Diagnosis not present

## 2022-06-11 DIAGNOSIS — Z87891 Personal history of nicotine dependence: Secondary | ICD-10-CM | POA: Diagnosis not present

## 2022-06-11 DIAGNOSIS — E1165 Type 2 diabetes mellitus with hyperglycemia: Secondary | ICD-10-CM | POA: Diagnosis not present

## 2022-06-11 DIAGNOSIS — Z1211 Encounter for screening for malignant neoplasm of colon: Secondary | ICD-10-CM | POA: Diagnosis not present

## 2022-06-11 DIAGNOSIS — E039 Hypothyroidism, unspecified: Secondary | ICD-10-CM | POA: Diagnosis not present

## 2022-06-13 DIAGNOSIS — D122 Benign neoplasm of ascending colon: Secondary | ICD-10-CM | POA: Diagnosis not present

## 2022-06-13 DIAGNOSIS — Z1211 Encounter for screening for malignant neoplasm of colon: Secondary | ICD-10-CM | POA: Diagnosis not present

## 2022-06-13 DIAGNOSIS — K648 Other hemorrhoids: Secondary | ICD-10-CM | POA: Diagnosis not present

## 2022-06-13 DIAGNOSIS — D128 Benign neoplasm of rectum: Secondary | ICD-10-CM | POA: Diagnosis not present

## 2022-06-26 DIAGNOSIS — E1165 Type 2 diabetes mellitus with hyperglycemia: Secondary | ICD-10-CM | POA: Diagnosis not present

## 2022-06-26 DIAGNOSIS — E039 Hypothyroidism, unspecified: Secondary | ICD-10-CM | POA: Diagnosis not present

## 2022-07-20 DIAGNOSIS — Z6828 Body mass index (BMI) 28.0-28.9, adult: Secondary | ICD-10-CM | POA: Diagnosis not present

## 2022-07-20 DIAGNOSIS — L237 Allergic contact dermatitis due to plants, except food: Secondary | ICD-10-CM | POA: Diagnosis not present

## 2022-09-18 DIAGNOSIS — E1165 Type 2 diabetes mellitus with hyperglycemia: Secondary | ICD-10-CM | POA: Diagnosis not present

## 2022-09-18 DIAGNOSIS — Z87891 Personal history of nicotine dependence: Secondary | ICD-10-CM | POA: Diagnosis not present

## 2022-09-18 DIAGNOSIS — F1029 Alcohol dependence with unspecified alcohol-induced disorder: Secondary | ICD-10-CM | POA: Diagnosis not present

## 2022-09-18 DIAGNOSIS — R911 Solitary pulmonary nodule: Secondary | ICD-10-CM | POA: Diagnosis not present

## 2022-09-18 DIAGNOSIS — E7800 Pure hypercholesterolemia, unspecified: Secondary | ICD-10-CM | POA: Diagnosis not present

## 2022-09-18 DIAGNOSIS — I10 Essential (primary) hypertension: Secondary | ICD-10-CM | POA: Diagnosis not present

## 2022-09-18 DIAGNOSIS — E039 Hypothyroidism, unspecified: Secondary | ICD-10-CM | POA: Diagnosis not present

## 2022-09-25 ENCOUNTER — Other Ambulatory Visit: Payer: Self-pay | Admitting: Internal Medicine

## 2022-09-25 DIAGNOSIS — R911 Solitary pulmonary nodule: Secondary | ICD-10-CM

## 2022-10-17 ENCOUNTER — Ambulatory Visit
Admission: RE | Admit: 2022-10-17 | Discharge: 2022-10-17 | Disposition: A | Discharge status: Home / Self Care | Payer: 59 | Source: Ambulatory Visit | Attending: Internal Medicine | Admitting: Internal Medicine

## 2022-10-17 DIAGNOSIS — R911 Solitary pulmonary nodule: Secondary | ICD-10-CM | POA: Diagnosis not present

## 2022-10-17 DIAGNOSIS — K8020 Calculus of gallbladder without cholecystitis without obstruction: Secondary | ICD-10-CM | POA: Diagnosis not present

## 2022-10-17 DIAGNOSIS — I700 Atherosclerosis of aorta: Secondary | ICD-10-CM | POA: Diagnosis not present

## 2022-10-29 ENCOUNTER — Other Ambulatory Visit: Payer: 59

## 2022-10-30 DIAGNOSIS — S53401A Unspecified sprain of right elbow, initial encounter: Secondary | ICD-10-CM | POA: Diagnosis not present

## 2022-11-01 ENCOUNTER — Other Ambulatory Visit: Payer: Self-pay | Admitting: Internal Medicine

## 2022-11-01 DIAGNOSIS — E041 Nontoxic single thyroid nodule: Secondary | ICD-10-CM

## 2022-11-08 ENCOUNTER — Ambulatory Visit
Admission: RE | Admit: 2022-11-08 | Discharge: 2022-11-08 | Disposition: A | Discharge status: Home / Self Care | Payer: 59 | Source: Ambulatory Visit | Attending: Internal Medicine | Admitting: Internal Medicine

## 2022-11-08 DIAGNOSIS — E041 Nontoxic single thyroid nodule: Secondary | ICD-10-CM

## 2022-11-08 DIAGNOSIS — E042 Nontoxic multinodular goiter: Secondary | ICD-10-CM | POA: Diagnosis not present

## 2022-11-14 ENCOUNTER — Other Ambulatory Visit: Payer: Self-pay | Admitting: Internal Medicine

## 2022-11-14 DIAGNOSIS — E042 Nontoxic multinodular goiter: Secondary | ICD-10-CM

## 2022-11-15 ENCOUNTER — Other Ambulatory Visit: Payer: Self-pay | Admitting: Internal Medicine

## 2022-11-15 DIAGNOSIS — E042 Nontoxic multinodular goiter: Secondary | ICD-10-CM

## 2022-11-22 ENCOUNTER — Ambulatory Visit
Admission: RE | Admit: 2022-11-22 | Discharge: 2022-11-22 | Disposition: A | Discharge status: Home / Self Care | Payer: 59 | Source: Ambulatory Visit | Attending: Internal Medicine | Admitting: Internal Medicine

## 2022-11-22 ENCOUNTER — Other Ambulatory Visit (HOSPITAL_COMMUNITY)
Admission: RE | Admit: 2022-11-22 | Discharge: 2022-11-22 | Disposition: A | Discharge status: Home / Self Care | Payer: 59 | Source: Ambulatory Visit | Attending: Internal Medicine | Admitting: Internal Medicine

## 2022-11-22 DIAGNOSIS — E041 Nontoxic single thyroid nodule: Secondary | ICD-10-CM | POA: Insufficient documentation

## 2022-11-22 DIAGNOSIS — E042 Nontoxic multinodular goiter: Secondary | ICD-10-CM

## 2022-11-22 DIAGNOSIS — R041 Hemorrhage from throat: Secondary | ICD-10-CM | POA: Diagnosis not present

## 2022-11-26 LAB — CYTOLOGY - NON PAP

## 2023-05-27 DIAGNOSIS — R972 Elevated prostate specific antigen [PSA]: Secondary | ICD-10-CM | POA: Diagnosis not present

## 2023-05-27 DIAGNOSIS — Z79899 Other long term (current) drug therapy: Secondary | ICD-10-CM | POA: Diagnosis not present

## 2023-06-10 DIAGNOSIS — Z09 Encounter for follow-up examination after completed treatment for conditions other than malignant neoplasm: Secondary | ICD-10-CM | POA: Diagnosis not present

## 2023-06-10 DIAGNOSIS — D128 Benign neoplasm of rectum: Secondary | ICD-10-CM | POA: Diagnosis not present

## 2023-06-10 DIAGNOSIS — Z860101 Personal history of adenomatous and serrated colon polyps: Secondary | ICD-10-CM | POA: Diagnosis not present

## 2023-06-10 DIAGNOSIS — D124 Benign neoplasm of descending colon: Secondary | ICD-10-CM | POA: Diagnosis not present

## 2023-06-10 DIAGNOSIS — D123 Benign neoplasm of transverse colon: Secondary | ICD-10-CM | POA: Diagnosis not present

## 2023-06-10 DIAGNOSIS — D122 Benign neoplasm of ascending colon: Secondary | ICD-10-CM | POA: Diagnosis not present

## 2023-07-09 NOTE — Progress Notes (Unsigned)
 NEUROLOGY FOLLOW UP OFFICE NOTE  Jesse Baker [id number]  Assessment/Plan:   Suspect left ulnar neuropathy with slowing across the elbow.    Repeat updated NCV-EMG of left upper extremity. Pending results, will likely refer to surgery     Subjective:  Jesse Baker is a 58 year old right-handed male with HTN and HLD who follows up for left upper extremity numbness  UPDATE: Last seen in August 2023.  Due to the recurrent habitual semiology of his left arm symptoms, I thought his symptoms were due to a peripheral nerve issue and not TIAs, so I advised to discontinue Plavix  and continue ASA 81mg  daily.  He had a NCV-EMG of the upper extremities on 10/05/2021 which revealed moderate right/mild left median neuropathy at/distal to the wrist (carpal tunnel syndrome) and moderate left ulnar neuropathy with slowing across the elbow.  He was lost to follow up.  It did improve but has acted up in the last couple of weeks.  Finger tips are numb (mostly last 3 digits) and tingling in arm (around the elbow but does travel down the arm.  Comes and goes all day.     HISTORY: On 07/11/2020, patient developed acute onset left arm numbness while driving.  No chest pain, weakness or speech disturbance.  He had not been feeling well for a couple of days following a COVID exposure.  He is an alcoholic and had stopped drinking that day, so he was also feeling tremulous.  Presented to [facility] ED.  Due to mild symptoms, not tPA candidate.  CT head personally reviewed showed no acute findings.  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  MRA of head and neck showed no occlusion or hemodynamically significant stenosis.  Echocardiogram showed EF 60-65% with no cardiac source of embolus.  Telemetry did not reveal a fib.  Direct LDL was 53.2, Hgb A1c 5.4.  He was already on ASA  81mg , so Plavix  was added.  Atorvastatin  was increased from 10mg  to 80mg  daily.He has been feeling well.  His left arm feels mildly different but symptoms largely resolved.  Since then, he has had recurrent episodes of left arm numbness, usually when laying down in wrong position or sitting in recliner.  Some neck pain but no radicular pain or weakness of arm and hand.  It mostly involves the last 3 digits of his left hand and notes tingling around his elbow.  Sometimes reports right hand numbness and will just shake it out.  Due to recurrent habitual nature of symptoms (and the fact that it was positional), I did not think these symptoms were TIAs but rather peripheral nerve etiology.  PAST MEDICAL HISTORY: Past Medical History:  Diagnosis Date   Allergy    Headache    Hyperlipidemia    Hypertension    Hypothyroidism    Seasonal allergies    TIA (transient ischemic attack) 07/11/2020    MEDICATIONS: Current Outpatient Medications on File Prior to Visit  Medication Sig Dispense Refill   acetaminophen  (TYLENOL ) 500 MG tablet Take 500-1,000 mg by mouth every 6 (six) hours as needed (pain.).     amLODipine  (NORVASC ) 10 MG tablet Take 1 tablet (10 mg total) by mouth every evening. Please call [phone] to schedule an overdue appointment for future refills. Thank you. 2nd attempt. 15 tablet 0   atorvastatin  (LIPITOR ) 80 MG tablet TAKE 1 TABLET BY  MOUTH EVERY DAY 90 tablet 0   fluticasone  (FLONASE ) 50 MCG/ACT nasal spray Place 2 sprays into both nostrils in the morning.     levothyroxine  (SYNTHROID ) 100 MCG tablet Take 1 tablet (100 mcg total) by mouth daily before breakfast. 90 tablet 3   loratadine  (CLARITIN ) 10 MG tablet Take 10 mg by mouth in the morning.     losartan  (COZAAR ) 100 MG tablet Take 1 tablet (100 mg total) by mouth daily. Please call [phone] to schedule an overdue appointment for future refills. Thank you. 2nd attempt. 15 tablet 0   meloxicam (MOBIC) 15 MG tablet Take 15  mg by mouth daily.     metoprolol  succinate (TOPROL -XL) 100 MG 24 hr tablet Take 1 tablet (100 mg total) by mouth daily. Please call [phone] to schedule an overdue appointment for future refills. Thank you. 2nd attempt. 15 tablet 0   sildenafil  (REVATIO ) 20 MG tablet Take 3-5 tablets 1 hour prior to sexual activity (Patient taking differently: Take 60-100 mg by mouth See admin instructions. Take 60-100 mg by mouth one hour prior to sexual activity) 30 tablet 11   No current facility-administered medications on file prior to visit.    ALLERGIES: No Known Allergies  FAMILY HISTORY: Family History  Problem Relation Age of Onset   Hypertension Mother    Parkinsonism Paternal Grandmother    Heart attack Paternal Grandmother    Cancer Paternal Grandfather        possible prostate cancer   Stroke Neg Hx       Objective:  Blood pressure 115/73, pulse 74, height 6\' 3"  (1.905 m), weight 250 lb (113.4 kg), SpO2 95%. General: No acute distress.  Patient appears well-groomed.   Head:  Normocephalic/atraumatic Eyes:  Fundi examined but not visualized Neck: supple, no paraspinal tenderness, full range of motion Heart:  Regular rate and rhythm Lungs:  Clear to auscultation bilaterally Back: No paraspinal tenderness Neurological Exam: alert and oriented.  Speech fluent and not dysarthric, language intact.  CN II-XII intact. Bulk and tone normal, muscle strength 5/5 throughout.  Sensation to light touch reduced in the last 3 digits of left hand.  Tinel sign at elbow and wrist negative.  Deep tendon reflexes 2+ throughout, toes downgoing.  Finger to nose testing intact.  Gait normal, Romberg negative.   Janne Members, DO  CC: Charlene Conners, MD

## 2023-07-10 ENCOUNTER — Ambulatory Visit: Admitting: Neurology

## 2023-07-10 ENCOUNTER — Encounter: Payer: Self-pay | Admitting: Neurology

## 2023-07-10 VITALS — BP 115/73 | HR 74 | Ht 75.0 in | Wt 250.0 lb

## 2023-07-10 DIAGNOSIS — G5622 Lesion of ulnar nerve, left upper limb: Secondary | ICD-10-CM

## 2023-07-10 NOTE — Patient Instructions (Addendum)
 Repeat nerve study of left arm for an updated study.  Plan will be to refer you to surgery ELECTROMYOGRAM AND NERVE CONDUCTION STUDIES (EMG/NCS) INSTRUCTIONS  How to Prepare The neurologist conducting the EMG will need to know if you have certain medical conditions. Tell the neurologist and other EMG lab personnel if you: Have a pacemaker or any other electrical medical device Take blood-thinning medications Have hemophilia, a blood-clotting disorder that causes prolonged bleeding Bathing Take a shower or bath shortly before your exam in order to remove oils from your skin. Don't apply lotions or creams before the exam.  What to Expect You'll likely be asked to change into a hospital gown for the procedure and lie down on an examination table. The following explanations can help you understand what will happen during the exam.  Electrodes. The neurologist or a technician places surface electrodes at various locations on your skin depending on where you're experiencing symptoms. Or the neurologist may insert needle electrodes at different sites depending on your symptoms.  Sensations. The electrodes will at times transmit a tiny electrical current that you may feel as a twinge or spasm. The needle electrode may cause discomfort or pain that usually ends shortly after the needle is removed. If you are concerned about discomfort or pain, you may want to talk to the neurologist about taking a short break during the exam.  Instructions. During the needle EMG, the neurologist will assess whether there is any spontaneous electrical activity when the muscle is at rest - activity that isn't present in healthy muscle tissue - and the degree of activity when you slightly contract the muscle.  He or she will give you instructions on resting and contracting a muscle at appropriate times. Depending on what muscles and nerves the neurologist is examining, he or she may ask you to change positions during the exam.   After your EMG You may experience some temporary, minor bruising where the needle electrode was inserted into your muscle. This bruising should fade within several days. If it persists, contact your primary care doctor.

## 2023-08-22 ENCOUNTER — Ambulatory Visit: Admitting: Neurology

## 2023-08-22 ENCOUNTER — Ambulatory Visit: Payer: Self-pay | Admitting: Neurology

## 2023-08-22 DIAGNOSIS — G5603 Carpal tunnel syndrome, bilateral upper limbs: Secondary | ICD-10-CM

## 2023-08-22 DIAGNOSIS — G5602 Carpal tunnel syndrome, left upper limb: Secondary | ICD-10-CM

## 2023-08-22 DIAGNOSIS — G5622 Lesion of ulnar nerve, left upper limb: Secondary | ICD-10-CM

## 2023-08-22 DIAGNOSIS — R200 Anesthesia of skin: Secondary | ICD-10-CM

## 2023-08-22 NOTE — Procedures (Signed)
  [facility]  [address] [place], [address]  [place], [place] [address] Tel: [phone] Fax: [phone] Test Date:  08/22/2023  Patient: Jesse Baker DOB: 01/17/1966 Physician: Tonita Blanch, DO  Sex: Male Height: 6' 3 Ref Phys: Juliene Dunnings, DO  ID#: [id number]   Technician:    History: This is a 58 year old man referred for evaluation of left hand numbness.  NCV & EMG Findings: Extensive electrodiagnostic testing of the left upper extremity shows:  Left median and ulnar sensory responses show prolonged latency (L4.0, L4.3 ms).  Left radial sensory response is within normal limits. Left median motor response is within normal limits.  Left ulnar motor response shows slowed conduction velocity across the elbow (A Elbow-B Elbow, 36 m/s).  Chronic motor axonal loss changes are seen affecting the left ulnar innervated muscles, without accompanying active denervation.   Impression: Left ulnar neuropathy with slowing across the elbow, demyelinating, moderate. Left median neuropathy at or distal to the wrist, consistent with a clinical diagnosis of carpal tunnel syndrome.  Overall, these findings are mild.   ___________________________ Tonita Blanch, DO    Nerve Conduction Studies   Stim Site NR Peak (ms) Norm Peak (ms) O-P Amp (V) Norm O-P Amp  Left Median Anti Sensory (2nd Digit)  32 C  Wrist    *4.0 <3.6 18.0 >15  Left Radial Anti Sensory (Base 1st Digit)  32 C  Wrist    2.5 <2.7 16.8 >14  Left Ulnar Anti Sensory (5th Digit)  32 C  Wrist    *4.3 <3.1 13.9 >10     Stim Site NR Onset (ms) Norm Onset (ms) O-P Amp (mV) Norm O-P Amp Site1 Site2 Delta-0 (ms) Dist (cm) Vel (m/s) Norm Vel (m/s)  Left Median Motor (Abd Poll Brev)  32 C  Wrist    3.8 <4.0 8.3 >6 Elbow Wrist 6.4 34.0 53 >50  Elbow    10.2  7.1         Left Ulnar Motor (Abd Dig Minimi)  32 C  Wrist    3.0 <3.1 7.3 >7 B Elbow Wrist 4.9 25.0 51 >50  B Elbow    7.9  7.2  A Elbow B Elbow 2.8 10.0 *36 >50  A  Elbow    10.7  6.6          Electromyography   Side Muscle Ins.Act Fibs Fasc Recrt Amp Dur Poly Activation Comment  Left 1stDorInt Nml Nml Nml *1- *1+ *1+ *1+ Nml N/A  Left Abd Poll Brev Nml Nml Nml Nml Nml Nml Nml Nml N/A  Left PronatorTeres Nml Nml Nml Nml Nml Nml Nml Nml N/A  Left Biceps Nml Nml Nml Nml Nml Nml Nml Nml N/A  Left Triceps Nml Nml Nml Nml Nml Nml Nml Nml N/A  Left Deltoid Nml Nml Nml Nml Nml Nml Nml Nml N/A  Left Abd Dig Min Nml Nml Nml *1- *1+ *1+ *1+ Nml N/A  Left FlexCarpiUln Nml Nml Nml *1- *1+ *1+ *1+ Nml N/A      Waveforms:

## 2023-08-23 ENCOUNTER — Encounter: Payer: Self-pay | Admitting: Advanced Practice Midwife

## 2023-08-26 NOTE — Telephone Encounter (Signed)
-----   Message from Juliene Lamar Dunnings sent at 08/22/2023  6:11 PM EDT ----- Like the previous test, there is evidence of ulnar neuropathy pressed at the elbow, which is the likely cause of the hand numbness.  There is also evidence of carpal tunnel as well, but that is only  mild.  I would like to refer to surgery for left ulnar neuropathy at the elbow.   ----- Message ----- From: Tobie Tonita POUR, DO Sent: 08/22/2023  12:11 PM EDT To: Juliene JONELLE Dunnings, DO

## 2023-09-24 DIAGNOSIS — Z6830 Body mass index (BMI) 30.0-30.9, adult: Secondary | ICD-10-CM | POA: Diagnosis not present

## 2023-09-24 DIAGNOSIS — G5622 Lesion of ulnar nerve, left upper limb: Secondary | ICD-10-CM | POA: Diagnosis not present

## 2023-10-24 DIAGNOSIS — G5602 Carpal tunnel syndrome, left upper limb: Secondary | ICD-10-CM | POA: Diagnosis not present

## 2023-10-24 DIAGNOSIS — G5622 Lesion of ulnar nerve, left upper limb: Secondary | ICD-10-CM | POA: Diagnosis not present

## 2023-10-24 DIAGNOSIS — E119 Type 2 diabetes mellitus without complications: Secondary | ICD-10-CM | POA: Diagnosis not present

## 2023-10-24 DIAGNOSIS — E038 Other specified hypothyroidism: Secondary | ICD-10-CM | POA: Diagnosis not present

## 2023-10-28 ENCOUNTER — Other Ambulatory Visit: Payer: Self-pay | Admitting: Internal Medicine

## 2023-10-28 DIAGNOSIS — E041 Nontoxic single thyroid nodule: Secondary | ICD-10-CM

## 2023-10-28 DIAGNOSIS — Z122 Encounter for screening for malignant neoplasm of respiratory organs: Secondary | ICD-10-CM

## 2023-11-01 ENCOUNTER — Other Ambulatory Visit: Payer: Self-pay | Admitting: Internal Medicine

## 2023-11-01 DIAGNOSIS — R911 Solitary pulmonary nodule: Secondary | ICD-10-CM

## 2023-11-08 ENCOUNTER — Ambulatory Visit
Admission: RE | Admit: 2023-11-08 | Discharge: 2023-11-08 | Disposition: A | Discharge status: Home / Self Care | Source: Ambulatory Visit | Attending: Internal Medicine | Admitting: Internal Medicine

## 2023-11-08 DIAGNOSIS — R918 Other nonspecific abnormal finding of lung field: Secondary | ICD-10-CM | POA: Diagnosis not present

## 2023-11-08 DIAGNOSIS — R911 Solitary pulmonary nodule: Secondary | ICD-10-CM

## 2023-11-08 DIAGNOSIS — J984 Other disorders of lung: Secondary | ICD-10-CM | POA: Diagnosis not present

## 2023-12-19 DIAGNOSIS — E1165 Type 2 diabetes mellitus with hyperglycemia: Secondary | ICD-10-CM | POA: Diagnosis not present

## 2024-01-20 ENCOUNTER — Inpatient Hospital Stay: Admission: RE | Admit: 2024-01-20 | Discharge: 2024-01-20 | Attending: Internal Medicine | Admitting: Internal Medicine

## 2024-01-20 DIAGNOSIS — E042 Nontoxic multinodular goiter: Secondary | ICD-10-CM | POA: Diagnosis not present

## 2024-01-20 DIAGNOSIS — E041 Nontoxic single thyroid nodule: Secondary | ICD-10-CM

## 2024-01-23 ENCOUNTER — Ambulatory Visit: Admitting: Neurology

## 2024-01-24 DIAGNOSIS — G5622 Lesion of ulnar nerve, left upper limb: Secondary | ICD-10-CM | POA: Diagnosis not present

## 2024-01-24 DIAGNOSIS — G5602 Carpal tunnel syndrome, left upper limb: Secondary | ICD-10-CM | POA: Diagnosis not present
